# Patient Record
Sex: Male | Born: 1965
Health system: Southern US, Community
[De-identification: ages and names within clinical notes are randomized; demographics above are authoritative.]

## PROBLEM LIST (undated history)

## (undated) DIAGNOSIS — Z720 Tobacco use: Secondary | ICD-10-CM

## (undated) DIAGNOSIS — I739 Peripheral vascular disease, unspecified: Secondary | ICD-10-CM

## (undated) DIAGNOSIS — I10 Essential (primary) hypertension: Secondary | ICD-10-CM

## (undated) DIAGNOSIS — J449 Chronic obstructive pulmonary disease, unspecified: Secondary | ICD-10-CM

## (undated) DIAGNOSIS — I219 Acute myocardial infarction, unspecified: Secondary | ICD-10-CM

## (undated) HISTORY — DX: Tobacco use: Z72.0

## (undated) HISTORY — DX: Peripheral vascular disease, unspecified: I73.9

## (undated) HISTORY — DX: Chronic obstructive pulmonary disease, unspecified: J44.9

---

## 2015-08-04 DIAGNOSIS — I2510 Atherosclerotic heart disease of native coronary artery without angina pectoris: Secondary | ICD-10-CM

## 2015-08-04 HISTORY — PX: PERCUTANEOUS CORONARY STENT INTERVENTION (PCI-S): SHX6016

## 2015-08-04 HISTORY — DX: Atherosclerotic heart disease of native coronary artery without angina pectoris: I25.10

## 2015-12-11 ENCOUNTER — Encounter (HOSPITAL_COMMUNITY): Payer: Self-pay | Admitting: Emergency Medicine

## 2015-12-11 ENCOUNTER — Emergency Department (HOSPITAL_COMMUNITY)
Admission: EM | Admit: 2015-12-11 | Discharge: 2015-12-12 | Disposition: A | Discharge status: Home / Self Care | Payer: Self-pay | Attending: Emergency Medicine | Admitting: Emergency Medicine

## 2015-12-11 DIAGNOSIS — R000 Tachycardia, unspecified: Secondary | ICD-10-CM | POA: Insufficient documentation

## 2015-12-11 DIAGNOSIS — F1020 Alcohol dependence, uncomplicated: Secondary | ICD-10-CM | POA: Insufficient documentation

## 2015-12-11 DIAGNOSIS — I10 Essential (primary) hypertension: Secondary | ICD-10-CM | POA: Insufficient documentation

## 2015-12-11 DIAGNOSIS — F17200 Nicotine dependence, unspecified, uncomplicated: Secondary | ICD-10-CM | POA: Insufficient documentation

## 2015-12-11 DIAGNOSIS — R251 Tremor, unspecified: Secondary | ICD-10-CM | POA: Insufficient documentation

## 2015-12-11 HISTORY — DX: Essential (primary) hypertension: I10

## 2015-12-11 LAB — CBC
HCT: 45.6 % (ref 39.0–52.0)
Hemoglobin: 15.3 g/dL (ref 13.0–17.0)
MCH: 29.5 pg (ref 26.0–34.0)
MCHC: 33.6 g/dL (ref 30.0–36.0)
MCV: 87.9 fL (ref 78.0–100.0)
Platelets: 171 10*3/uL (ref 150–400)
RBC: 5.19 MIL/uL (ref 4.22–5.81)
RDW: 16 % — AB (ref 11.5–15.5)
WBC: 3.9 10*3/uL — ABNORMAL LOW (ref 4.0–10.5)

## 2015-12-11 LAB — COMPREHENSIVE METABOLIC PANEL
ALK PHOS: 79 U/L (ref 38–126)
ALT: 21 U/L (ref 17–63)
AST: 32 U/L (ref 15–41)
Albumin: 3.7 g/dL (ref 3.5–5.0)
Anion gap: 16 — ABNORMAL HIGH (ref 5–15)
BILIRUBIN TOTAL: 0.4 mg/dL (ref 0.3–1.2)
BUN: 13 mg/dL (ref 6–20)
CO2: 24 mmol/L (ref 22–32)
CREATININE: 1.17 mg/dL (ref 0.61–1.24)
Calcium: 8.6 mg/dL — ABNORMAL LOW (ref 8.9–10.3)
Chloride: 96 mmol/L — ABNORMAL LOW (ref 101–111)
GFR calc Af Amer: >60 mL/min (ref >60)
GLUCOSE: 134 mg/dL — AB (ref 65–99)
Potassium: 4.1 mmol/L (ref 3.5–5.1)
Sodium: 136 mmol/L (ref 135–145)
TOTAL PROTEIN: 7.6 g/dL (ref 6.5–8.1)

## 2015-12-11 LAB — ETHANOL: ALCOHOL ETHYL (B): 200 mg/dL — AB (ref <5)

## 2015-12-11 LAB — RAPID URINE DRUG SCREEN, HOSP PERFORMED
Amphetamines: NOT DETECTED
BARBITURATES: NOT DETECTED
Benzodiazepines: NOT DETECTED
COCAINE: NOT DETECTED
OPIATES: NOT DETECTED
Tetrahydrocannabinol: NOT DETECTED

## 2015-12-11 MED ORDER — SODIUM CHLORIDE 0.9 % IV BOLUS (SEPSIS): 1000.0000 mL | Freq: Once | INTRAVENOUS | Status: DC

## 2015-12-11 MED ORDER — THIAMINE HCL 100 MG/ML IJ SOLN
Freq: Once | INTRAVENOUS | Status: AC
  Administered 2015-12-12: 01:00:00 via INTRAVENOUS
  Filled 2015-12-11: qty 1000
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
## 2015-12-11 NOTE — ED Notes (Signed)
Pt. requesting detox for his alcoholism , last drink today , denies suicidal ideation / no hallucinations . Alert and oriented , respirations unlabored .

## 2015-12-12 MED ORDER — HYDROCHLOROTHIAZIDE 25 MG PO TABS: 25.0000 mg | ORAL_TABLET | Freq: Every day | ORAL | Status: DC

## 2015-12-12 MED ORDER — CHLORDIAZEPOXIDE HCL 25 MG PO CAPS: 25.0000 mg | ORAL_CAPSULE | Freq: Once | ORAL | Status: DC

## 2015-12-12 MED ORDER — CHLORDIAZEPOXIDE HCL 25 MG PO CAPS
50.0000 mg | ORAL_CAPSULE | Freq: Once | ORAL | Status: AC
  Administered 2015-12-12: 50 mg via ORAL
  Filled 2015-12-12: qty 2

## 2015-12-12 MED ORDER — CHLORDIAZEPOXIDE HCL 25 MG PO CAPS: ORAL_CAPSULE | ORAL | Status: DC

## 2015-12-12 NOTE — Discharge Instructions (Signed)
Alcohol Use Disorder °Alcohol use disorder is a mental disorder. It is not a one-time incident of heavy drinking. Alcohol use disorder is the excessive and uncontrollable use of alcohol over time that leads to problems with functioning in one or more areas of daily living. People with this disorder risk harming themselves and others when they drink to excess. Alcohol use disorder also can cause other mental disorders, such as mood and anxiety disorders, and serious physical problems. People with alcohol use disorder often misuse other drugs.  °Alcohol use disorder is common and widespread. Some people with this disorder drink alcohol to cope with or escape from negative life events. Others drink to relieve chronic pain or symptoms of mental illness. People with a family history of alcohol use disorder are at higher risk of losing control and using alcohol to excess.  °Drinking too much alcohol can cause injury, accidents, and health problems. One drink can be too much when you are: °· Working. °· Pregnant or breastfeeding. °· Taking medicines. Ask your doctor. °· Driving or planning to drive. °SYMPTOMS  °Signs and symptoms of alcohol use disorder may include the following:  °· Consumption of alcohol in larger amounts or over a longer period of time than intended. °· Multiple unsuccessful attempts to cut down or control alcohol use.   °· A great deal of time spent obtaining alcohol, using alcohol, or recovering from the effects of alcohol (hangover). °· A strong desire or urge to use alcohol (cravings).   °· Continued use of alcohol despite problems at work, school, or home because of alcohol use.   °· Continued use of alcohol despite problems in relationships because of alcohol use. °· Continued use of alcohol in situations when it is physically hazardous, such as driving a car. °· Continued use of alcohol despite awareness of a physical or psychological problem that is likely related to alcohol use. Physical  problems related to alcohol use can involve the brain, heart, liver, stomach, and intestines. Psychological problems related to alcohol use include intoxication, depression, anxiety, psychosis, delirium, and dementia.   °· The need for increased amounts of alcohol to achieve the same desired effect, or a decreased effect from the consumption of the same amount of alcohol (tolerance). °· Withdrawal symptoms upon reducing or stopping alcohol use, or alcohol use to reduce or avoid withdrawal symptoms. Withdrawal symptoms include: °¨ Racing heart. °¨ Hand tremor. °¨ Difficulty sleeping. °¨ Nausea. °¨ Vomiting. °¨ Hallucinations. °¨ Restlessness. °¨ Seizures. °DIAGNOSIS °Alcohol use disorder is diagnosed through an assessment by your health care provider. Your health care provider may start by asking three or four questions to screen for excessive or problematic alcohol use. To confirm a diagnosis of alcohol use disorder, at least two symptoms must be present within a 12-month period. The severity of alcohol use disorder depends on the number of symptoms: °· Mild--two or three. °· Moderate--four or five. °· Severe--six or more. °Your health care provider may perform a physical exam or use results from lab tests to see if you have physical problems resulting from alcohol use. Your health care provider may refer you to a mental health professional for evaluation. °TREATMENT  °Some people with alcohol use disorder are able to reduce their alcohol use to low-risk levels. Some people with alcohol use disorder need to quit drinking alcohol. When necessary, mental health professionals with specialized training in substance use treatment can help. Your health care provider can help you decide how severe your alcohol use disorder is and what type of treatment you need.   The following forms of treatment are available:  °· Detoxification. Detoxification involves the use of prescription medicines to prevent alcohol withdrawal  symptoms in the first week after quitting. This is important for people with a history of symptoms of withdrawal and for heavy drinkers who are likely to have withdrawal symptoms. Alcohol withdrawal can be dangerous and, in severe cases, cause death. Detoxification is usually provided in a hospital or in-patient substance use treatment facility. °· Counseling or talk therapy. Talk therapy is provided by substance use treatment counselors. It addresses the reasons people use alcohol and ways to keep them from drinking again. The goals of talk therapy are to help people with alcohol use disorder find healthy activities and ways to cope with life stress, to identify and avoid triggers for alcohol use, and to handle cravings, which can cause relapse. °· Medicines. Different medicines can help treat alcohol use disorder through the following actions: °¨ Decrease alcohol cravings. °¨ Decrease the positive reward response felt from alcohol use. °¨ Produce an uncomfortable physical reaction when alcohol is used (aversion therapy). °· Support groups. Support groups are run by people who have quit drinking. They provide emotional support, advice, and guidance. °These forms of treatment are often combined. Some people with alcohol use disorder benefit from intensive combination treatment provided by specialized substance use treatment centers. Both inpatient and outpatient treatment programs are available. °  °This information is not intended to replace advice given to you by your health care provider. Make sure you discuss any questions you have with your health care provider. °  °Document Released: 08/27/2004 Document Revised: 08/10/2014 Document Reviewed: 10/27/2012 °Elsevier Interactive Patient Education ©2016 [employer] °Substance Abuse Treatment Programs ° °Intensive Outpatient Programs °[facility]     °[address]      °[place], [place]                   °336-878-6098      ° °[facility] °[address] °[place], [place] °[phone] ° °[facility]     °(Inpatient and outpatient)     °[address]           °[phone]   ° °[facility] °336-288-1484 (Suboxone and Methadone) ° °[address]      °[place], [place] [address]      °336-882-2125      ° °[address] °[place], [place] °852-3033 ° °[facility] (Outpatient/Inpatient, [facility])    °(insurance only) [phone]      °       °[facility]Groups & Residential) °[place], [place] °336-389-1413 ° °   °[facility]     °[address]     °[place], [place]      °[phone]      ° °[facility] (for caregivers and family) °[address] °[place], [place] °336-299-4655 ° ° ° ° ° °Residential Treatment Programs °[facility]      °[address], , [place] [address]  °([phone]      ° °[facility] °[address] [place], [place] [address] °919-419-1059 ° °[facility]        °336-248-8914      ° °[facility] °1-800-659-3381 ° °[facility]             °[address]                                         °  [place], [place]                                                °877-615-2722 or 336-784-9470                              ° °[facility] [place] °[address] °[place], [address] °1.877.941.8954 ° °[facility]    °[address]      °[place], [place]     °[phone]      ° °[facility] °[address] °[place], [place] °336-285-9073 ° °[facility]   °[address]     °[place], [place] [address]     °336-899-1550      °Admissions: 8am-3pm M-F ° °[facility] °[address] °[place], [place] °336-227-7417 ° °[facility]: Residential Program (90 Days)   °[place], [place]      °336-725-8389 or 800-758-6077    ° °[facility] °[place], [place] °(Walk in Hours over the weekend or by  referral) ° °[facility] °[address], [place], [place] [address] °(336) [phone] ° °Crisis Mobile: [facility]:  1-877-626-1772 (for crisis response 24 hours a day) °[facility] Hotline:      1-800-256-2452 °Outpatient Psychiatry and Counseling ° °[facility]: Mobile Crisis Management 24 hours:  1-877-626-1772 ° °[facility] sliding scale fee and walk in schedule: M-F 8am-12pm/1pm-3pm °[address]  °[place], [place] [address] °336-387-6161 ° °[facility] °[address] °[place], [place] [address] °336-703-9650 ° °[facility] ([facility] known [facility])- new patient walk-in appointments available Monday - Friday 8am -3pm.          °[address] °[place], [place] [address] °336-676-6840 or crisis line- 336-676-6905 ° °[facility]/ Intensive Outpatient Therapy Program °[address] °[place], [place] [address] °336-832-9804 ° °[facility]                  °Crisis Services      °[phone]      °[address]     °[place], [place] [address]                ° °[facility]   °[facility] °[phone] °[address] °[place], [place] [address] ° ° °[facility]          °[address]  °[place], [place] [address]       °(336) [phone] ° °[facility] °[address] °[place], [place] [address] °336-292-1510 ° °[facility]    °[address]    °[place], [place] [address]     °336-632-3505      ° °Parish McKinney, MD     °[address]     °[place] [address]     °336-282-1251     °  °[facility] °[address] °[place] [address] ° °[facility]     °[address]     °[place],       °336-542-2076      ° °[facility] °Shamsher Ahluwalia, MD °[address] °  [place], [place]  [address] °336-420-9558 ° °[facility]     °[address]     °[place], [place] [address]     °336-274-1237      ° °[facility] °[address] °[place], [place] [address] °336-854-4450 °Resources for Temporary Residential Assistance/Crisis Centers ° °DAY CENTERS °[facility] °M-F 8am-3pm   °[address] [place], [place] [address]   336-332-0824 °Services include: laundry, barbering, support groups, case management, phone  & computer access, showers, AA/NA mtgs, mental health/substance abuse nurse, job skills class, disability information, VA assistance, spiritual classes, etc.  ° °HOMELESS SHELTERS ° °[facility]     °[facility]   °[address], [place]     °[phone]       °       °[facility] (women and children)       °[address] °[place], [place] [address] °336-275-0820 °[email or web address] for application and process °Application Required ° °[facility]   °[address]    °[place] [address]     °[phone]       °             °[facility] °[address] °[place], [place] [address] °336.273.5572 °336-235-0363(schedule application appt.) °Application Required ° °[facility] (women only)    °[address]     °[place], [place] [address]     °336-884-1039      °Intake starts 6pm daily °Need valid ID, SSC, & Police report °[facility] [place] °[address] °[place], [place] °336-881-5420 °Application Required ° °[facility] (men only)     °[address]      °[place], [place]     °[phone]      ° °[facility] °(Pregnant women only) °[address] °[place], [place] °336-275-0206 ° °[facility]      °[address]      °[place], [place] [address]     °[phone]      °       °[facility] °[address] °[place],  °336-723-1848 °90 day commitment/SA/Application process ° °[facility] only[facility]     °[address]     °[place],      °[phone]       °Check-in at 7pm     °       °[facility] °[address] °[place],  [address] °336-248-6684 °Men/Women/Women and Children must be there by 7 pm ° °[facility] °[place],  °[phone]                ° °

## 2015-12-12 NOTE — ED Notes (Signed)
Pt departed in NAD.  

## 2016-02-20 DIAGNOSIS — R001 Bradycardia, unspecified: Secondary | ICD-10-CM | POA: Insufficient documentation

## 2016-02-20 DIAGNOSIS — F1010 Alcohol abuse, uncomplicated: Secondary | ICD-10-CM | POA: Insufficient documentation

## 2016-05-14 DIAGNOSIS — I2510 Atherosclerotic heart disease of native coronary artery without angina pectoris: Secondary | ICD-10-CM | POA: Insufficient documentation

## 2016-05-15 DIAGNOSIS — G4733 Obstructive sleep apnea (adult) (pediatric): Secondary | ICD-10-CM | POA: Insufficient documentation

## 2019-07-10 ENCOUNTER — Encounter (HOSPITAL_COMMUNITY): Payer: Self-pay | Admitting: Internal Medicine

## 2019-07-10 ENCOUNTER — Inpatient Hospital Stay (HOSPITAL_COMMUNITY)
Admission: EM | Admit: 2019-07-10 | Discharge: 2019-07-12 | DRG: 282 | Disposition: A | Discharge status: Home / Self Care | Payer: Self-pay | Attending: Internal Medicine | Admitting: Internal Medicine

## 2019-07-10 ENCOUNTER — Ambulatory Visit (HOSPITAL_COMMUNITY): Admit: 2019-07-10 | Payer: Self-pay | Admitting: Internal Medicine

## 2019-07-10 ENCOUNTER — Encounter (HOSPITAL_COMMUNITY)
Admission: EM | Disposition: A | Discharge status: Home / Self Care | Payer: Self-pay | Source: Home / Self Care | Attending: Internal Medicine

## 2019-07-10 DIAGNOSIS — I2510 Atherosclerotic heart disease of native coronary artery without angina pectoris: Secondary | ICD-10-CM

## 2019-07-10 DIAGNOSIS — E782 Mixed hyperlipidemia: Secondary | ICD-10-CM | POA: Diagnosis present

## 2019-07-10 DIAGNOSIS — I509 Heart failure, unspecified: Secondary | ICD-10-CM

## 2019-07-10 DIAGNOSIS — I517 Cardiomegaly: Secondary | ICD-10-CM | POA: Diagnosis present

## 2019-07-10 DIAGNOSIS — Z955 Presence of coronary angioplasty implant and graft: Secondary | ICD-10-CM

## 2019-07-10 DIAGNOSIS — I2111 ST elevation (STEMI) myocardial infarction involving right coronary artery: Principal | ICD-10-CM

## 2019-07-10 DIAGNOSIS — Z20828 Contact with and (suspected) exposure to other viral communicable diseases: Secondary | ICD-10-CM | POA: Diagnosis present

## 2019-07-10 DIAGNOSIS — Z720 Tobacco use: Secondary | ICD-10-CM

## 2019-07-10 DIAGNOSIS — R7302 Impaired glucose tolerance (oral): Secondary | ICD-10-CM

## 2019-07-10 DIAGNOSIS — E785 Hyperlipidemia, unspecified: Secondary | ICD-10-CM

## 2019-07-10 DIAGNOSIS — I213 ST elevation (STEMI) myocardial infarction of unspecified site: Secondary | ICD-10-CM | POA: Diagnosis present

## 2019-07-10 DIAGNOSIS — I252 Old myocardial infarction: Secondary | ICD-10-CM | POA: Diagnosis present

## 2019-07-10 DIAGNOSIS — Z79899 Other long term (current) drug therapy: Secondary | ICD-10-CM

## 2019-07-10 HISTORY — PX: CORONARY/GRAFT ACUTE MI REVASCULARIZATION: CATH118305

## 2019-07-10 HISTORY — PX: LEFT HEART CATH AND CORONARY ANGIOGRAPHY: CATH118249

## 2019-07-10 LAB — CBC
HCT: 43.1 % (ref 39.0–52.0)
Hemoglobin: 13.9 g/dL (ref 13.0–17.0)
MCH: 27.4 pg (ref 26.0–34.0)
MCHC: 32.3 g/dL (ref 30.0–36.0)
MCV: 84.8 fL (ref 80.0–100.0)
Platelets: 206 10*3/uL (ref 150–400)
RBC: 5.08 MIL/uL (ref 4.22–5.81)
RDW: 13.6 % (ref 11.5–15.5)
WBC: 5.3 10*3/uL (ref 4.0–10.5)
nRBC: 0 % (ref 0.0–0.2)

## 2019-07-10 LAB — COMPREHENSIVE METABOLIC PANEL
ALT: 14 U/L (ref 0–44)
AST: 19 U/L (ref 15–41)
Albumin: 3 g/dL — ABNORMAL LOW (ref 3.5–5.0)
Alkaline Phosphatase: 108 U/L (ref 38–126)
Anion gap: 8 (ref 5–15)
BUN: 12 mg/dL (ref 6–20)
CO2: 26 mmol/L (ref 22–32)
Calcium: 8.5 mg/dL — ABNORMAL LOW (ref 8.9–10.3)
Chloride: 105 mmol/L (ref 98–111)
Creatinine, Ser: 1.07 mg/dL (ref 0.61–1.24)
GFR calc Af Amer: >60 mL/min (ref >60)
GFR calc non Af Amer: >60 mL/min (ref >60)
Glucose, Bld: 147 mg/dL — ABNORMAL HIGH (ref 70–99)
Potassium: 3.6 mmol/L (ref 3.5–5.1)
Sodium: 139 mmol/L (ref 135–145)
Total Bilirubin: 0.7 mg/dL (ref 0.3–1.2)
Total Protein: 6.5 g/dL (ref 6.5–8.1)

## 2019-07-10 LAB — POCT ACTIVATED CLOTTING TIME
Activated Clotting Time: 235 seconds
Activated Clotting Time: 274 seconds
Activated Clotting Time: 169 seconds
Activated Clotting Time: 208 seconds

## 2019-07-10 LAB — POCT I-STAT, CHEM 8
BUN: 12 mg/dL (ref 6–20)
Calcium, Ion: 1.19 mmol/L (ref 1.15–1.40)
Chloride: 102 mmol/L (ref 98–111)
Creatinine, Ser: 0.9 mg/dL (ref 0.61–1.24)
Glucose, Bld: 147 mg/dL — ABNORMAL HIGH (ref 70–99)
HCT: 43 % (ref 39.0–52.0)
Hemoglobin: 14.6 g/dL (ref 13.0–17.0)
Potassium: 3.6 mmol/L (ref 3.5–5.1)
Sodium: 140 mmol/L (ref 135–145)
TCO2: 27 mmol/L (ref 22–32)

## 2019-07-10 LAB — HEMOGLOBIN A1C
Hgb A1c MFr Bld: 6.3 % — ABNORMAL HIGH (ref 4.8–5.6)
Mean Plasma Glucose: 134.11 mg/dL

## 2019-07-10 LAB — RESPIRATORY PANEL BY RT PCR (FLU A&B, COVID)
Influenza A by PCR: NEGATIVE
Influenza B by PCR: NEGATIVE
SARS Coronavirus 2 by RT PCR: NEGATIVE

## 2019-07-10 LAB — LIPID PANEL
Cholesterol: 215 mg/dL — ABNORMAL HIGH (ref 0–200)
HDL: 34 mg/dL — ABNORMAL LOW (ref >40)
LDL Cholesterol: 142 mg/dL — ABNORMAL HIGH (ref 0–99)
Total CHOL/HDL Ratio: 6.3 RATIO
Triglycerides: 195 mg/dL — ABNORMAL HIGH (ref <150)
VLDL: 39 mg/dL (ref 0–40)

## 2019-07-10 LAB — TROPONIN I (HIGH SENSITIVITY)
Troponin I (High Sensitivity): 50 ng/L — ABNORMAL HIGH (ref <18)
Troponin I (High Sensitivity): >27000 ng/L (ref <18)

## 2019-07-10 LAB — PROTIME-INR
INR: 1 (ref 0.8–1.2)
Prothrombin Time: 13 seconds (ref 11.4–15.2)

## 2019-07-10 LAB — APTT: aPTT: 24 seconds (ref 24–36)

## 2019-07-10 SURGERY — CORONARY/GRAFT ACUTE MI REVASCULARIZATION
Anesthesia: LOCAL

## 2019-07-10 MED ORDER — HEPARIN (PORCINE) IN NACL 1000-0.9 UT/500ML-% IV SOLN
INTRAVENOUS | Status: AC
  Filled 2019-07-10: qty 1000

## 2019-07-10 MED ORDER — ACETAMINOPHEN 325 MG PO TABS: 650.0000 mg | ORAL_TABLET | ORAL | Status: DC | PRN

## 2019-07-10 MED ORDER — LABETALOL HCL 5 MG/ML IV SOLN: 10.0000 mg | INTRAVENOUS | Status: AC | PRN

## 2019-07-10 MED ORDER — PRASUGREL HCL 10 MG PO TABS
10.0000 mg | ORAL_TABLET | Freq: Every day | ORAL | Status: DC
  Administered 2019-07-11 – 2019-07-12 (×2): 10 mg via ORAL
  Filled 2019-07-10 (×2): qty 1

## 2019-07-10 MED ORDER — HEPARIN SODIUM (PORCINE) 1000 UNIT/ML IJ SOLN
INTRAMUSCULAR | Status: AC
  Filled 2019-07-10: qty 1

## 2019-07-10 MED ORDER — HYDRALAZINE HCL 20 MG/ML IJ SOLN: 10.0000 mg | INTRAMUSCULAR | Status: AC | PRN

## 2019-07-10 MED ORDER — NITROGLYCERIN 1 MG/10 ML FOR IR/CATH LAB
INTRA_ARTERIAL | Status: DC | PRN
  Administered 2019-07-10: 200 ug via INTRACORONARY

## 2019-07-10 MED ORDER — METOPROLOL TARTRATE 12.5 MG HALF TABLET
12.5000 mg | ORAL_TABLET | Freq: Two times a day (BID) | ORAL | Status: DC
  Administered 2019-07-10: 22:00:00 12.5 mg via ORAL
  Filled 2019-07-10 (×3): qty 1

## 2019-07-10 MED ORDER — PRASUGREL HCL 10 MG PO TABS
ORAL_TABLET | ORAL | Status: AC
  Filled 2019-07-10: qty 6

## 2019-07-10 MED ORDER — NITROGLYCERIN 1 MG/10 ML FOR IR/CATH LAB
INTRA_ARTERIAL | Status: AC
  Filled 2019-07-10: qty 10

## 2019-07-10 MED ORDER — SODIUM CHLORIDE 0.9% FLUSH
3.0000 mL | Freq: Two times a day (BID) | INTRAVENOUS | Status: DC
  Administered 2019-07-10 – 2019-07-12 (×4): 3 mL via INTRAVENOUS

## 2019-07-10 MED ORDER — PRASUGREL HCL 10 MG PO TABS
ORAL_TABLET | ORAL | Status: DC | PRN
  Administered 2019-07-10: 60 mg via ORAL

## 2019-07-10 MED ORDER — SODIUM CHLORIDE 0.9% FLUSH: 3.0000 mL | INTRAVENOUS | Status: DC | PRN

## 2019-07-10 MED ORDER — ASPIRIN 81 MG PO CHEW
81.0000 mg | CHEWABLE_TABLET | Freq: Every day | ORAL | Status: DC
  Administered 2019-07-11 – 2019-07-12 (×2): 81 mg via ORAL
  Filled 2019-07-10 (×2): qty 1

## 2019-07-10 MED ORDER — ATORVASTATIN CALCIUM 80 MG PO TABS
80.0000 mg | ORAL_TABLET | Freq: Every day | ORAL | Status: DC
  Administered 2019-07-11: 17:00:00 80 mg via ORAL
  Filled 2019-07-10: qty 1

## 2019-07-10 MED ORDER — ONDANSETRON HCL 4 MG/2ML IJ SOLN: 4.0000 mg | Freq: Four times a day (QID) | INTRAMUSCULAR | Status: DC | PRN

## 2019-07-10 MED ORDER — HEPARIN SODIUM (PORCINE) 1000 UNIT/ML IJ SOLN
INTRAMUSCULAR | Status: DC | PRN
  Administered 2019-07-10: 2000 [IU] via INTRAVENOUS
  Administered 2019-07-10: 4000 [IU] via INTRAVENOUS
  Administered 2019-07-10: 10000 [IU] via INTRAVENOUS

## 2019-07-10 MED ORDER — LIDOCAINE HCL (PF) 1 % IJ SOLN
INTRAMUSCULAR | Status: AC
  Filled 2019-07-10: qty 30

## 2019-07-10 MED ORDER — SODIUM CHLORIDE 0.9 % IV SOLN: 250.0000 mL | INTRAVENOUS | Status: DC | PRN

## 2019-07-10 MED ORDER — LIDOCAINE HCL (PF) 1 % IJ SOLN
INTRAMUSCULAR | Status: DC | PRN
  Administered 2019-07-10: 30 mL via INTRADERMAL

## 2019-07-10 MED ORDER — FUROSEMIDE 10 MG/ML IJ SOLN
20.0000 mg | Freq: Once | INTRAMUSCULAR | Status: AC
  Administered 2019-07-10: 20 mg via INTRAVENOUS

## 2019-07-10 MED ORDER — ENOXAPARIN SODIUM 40 MG/0.4ML ~~LOC~~ SOLN
40.0000 mg | SUBCUTANEOUS | Status: DC
  Administered 2019-07-11 – 2019-07-12 (×2): 40 mg via SUBCUTANEOUS
  Filled 2019-07-10 (×2): qty 0.4

## 2019-07-10 MED ORDER — VERAPAMIL HCL 2.5 MG/ML IV SOLN
INTRAVENOUS | Status: AC
  Filled 2019-07-10: qty 2

## 2019-07-10 MED ORDER — FUROSEMIDE 10 MG/ML IJ SOLN
INTRAMUSCULAR | Status: AC
  Filled 2019-07-10: qty 4

## 2019-07-10 MED ORDER — IOHEXOL 350 MG/ML SOLN
INTRAVENOUS | Status: DC | PRN
  Administered 2019-07-10: 105 mL via INTRA_ARTERIAL

## 2019-07-10 SURGICAL SUPPLY — 24 items
BALLN SAPPHIRE 2.5X12 (BALLOONS) ×2
BALLN SAPPHIRE ~~LOC~~ 3.5X12 (BALLOONS) ×2 IMPLANT
BALLN SAPPHIRE ~~LOC~~ 3.5X15 (BALLOONS) ×2 IMPLANT
BALLOON SAPPHIRE 2.5X12 (BALLOONS) ×1 IMPLANT
CATH INFINITI 5FR ANG PIGTAIL (CATHETERS) ×2 IMPLANT
CATH INFINITI 5FR JL4 (CATHETERS) ×2 IMPLANT
CATH LAUNCHER 6FR JR4 (CATHETERS) ×2 IMPLANT
ELECT DEFIB PAD ADLT CADENCE (PAD) ×2 IMPLANT
GLIDESHEATH SLEND A-KIT 6F 22G (SHEATH) ×2 IMPLANT
GUIDEWIRE INQWIRE 1.5J.035X260 (WIRE) ×1 IMPLANT
HOVERMATT SINGLE USE (MISCELLANEOUS) ×2 IMPLANT
INQWIRE 1.5J .035X260CM (WIRE) ×2
KIT ENCORE 26 ADVANTAGE (KITS) ×2 IMPLANT
KIT HEART LEFT (KITS) ×2 IMPLANT
KIT MICROPUNCTURE NIT STIFF (SHEATH) ×2 IMPLANT
PACK CARDIAC CATHETERIZATION (CUSTOM PROCEDURE TRAY) ×2 IMPLANT
PROTECTION STATION PRESSURIZED (MISCELLANEOUS) ×2
SHEATH PROBE COVER 6X72 (BAG) ×2 IMPLANT
STATION PROTECTION PRESSURIZED (MISCELLANEOUS) ×1 IMPLANT
STENT RESOLUTE ONYX3.0X38 (Permanent Stent) ×2 IMPLANT
TRANSDUCER W/STOPCOCK (MISCELLANEOUS) ×2 IMPLANT
TUBING CIL FLEX 10 FLL-RA (TUBING) ×2 IMPLANT
WIRE EMERALD 3MM-J .035X150CM (WIRE) ×2 IMPLANT
WIRE RUNTHROUGH .014X180CM (WIRE) ×2 IMPLANT

## 2019-07-10 NOTE — Progress Notes (Signed)
Site area: Right groin a 6 french arterial sheath was removed  Site Prior to Removal:  Level 0  Pressure Applied For 25 MINUTES    [place] Beginning at 1815p  Manual:   Yes.    Patient Status During Pull:  stable  Post Pull Groin Site:  Level 0  Post Pull Instructions Given:  Yes.    Post Pull Pulses Present:  Yes.    Dressing Applied:  Yes.    Comments:

## 2019-07-10 NOTE — Brief Op Note (Signed)
BRIEF CARDIAC CATHETERIZATION NOTE  07/10/2019  2:52 PM  PATIENT:  Jeffrey Winters  53 y.o. male  PRE-OPERATIVE DIAGNOSIS:  STEMI  POST-OPERATIVE DIAGNOSIS:  STEMI  PROCEDURE:  Procedure(s): CORONARY/GRAFT ACUTE MI REVASCULARIZATION (N/A)  SURGEON:  Surgeon(s) and Role:    * , , MD - Primary  FINDINGS: 1. 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 2. Patent stent in OM2. 3. Moderately to severely elevated LVEDP (~35 mmHg). 4. Successful primary PCI to distal RCA using resolute Onyx 3.0 x38 mm DES with 0% residual stenosis and TIMI-3 flow.  RECOMMENDATIONS: 1. Admit to 2H-ICU for post-STEMI care. 2. Obtain TTE; ventriculogram not performed due to significantly elevated LVEDP. 3. DAPT with ASA and prasugrel for at least 12 months. 4. Aggressive secondary prevention.  Nelva Bush, MD [facility]

## 2019-07-10 NOTE — H&P (Signed)
Cardiology Admission History and Physical:   Patient ID: Jeffrey Winters MRN: [id number]; DOB: [birth year]   Admission date: 07/10/2019  Primary Care Provider: Patient, No Pcp Per Primary Cardiologist: [facility] Primary Electrophysiologist:  None   Chief Complaint: Chest pain  Patient Profile:   Jeffrey Winters is a 53 y.o. male with coronary artery disease status post NSTEMI's in July and October of 2017 status post PCI to OM2 in 02/2016, hypertension, and tobacco use, presenting with cute onset of chest pain and inferior ST segment elevation on EKG concerning for inferior STEMI.  History of Present Illness:   Jeffrey Winters reports that he was in his usual state of health until approximately 1130 this morning when he developed acute onset of left-sided chest pain at rest.  He describes it as a cramping feeling radiating to the left shoulder and upper back accompanied by diaphoresis.  He initially tried to wait the pain out, though his mom subsequently called 911.  When EMS arrived, patient was noted to have inferior ST segment elevation.  He received 2 sublingual nitroglycerin tabs, which helped improve his chest pain from 8/10 to 6/10.  Due to continued chest pain and inferior ST segment elevation on EKG, [place] was taken for emergent cardiac catheterization.  This revealed an occluded distal RCA as well as noncritical lesions involving the mid LAD and rPL2 .  Jeffrey Winters underwent primary PCI to the distal RCA with resolution of chest pain.  Heart Pathway Score:     Past Medical History:  Diagnosis Date   Coronary artery disease 2017   MI s/p PCI to OM2   Hypertension     Past Surgical History:  Procedure Laterality Date   PERCUTANEOUS CORONARY STENT INTERVENTION (PCI-S)  2017   DES to OM2 at [facility]     Medications Prior to Admission: Prior to Admission medications   Medication Sig Start Date  Date Taking? Authorizing Provider  chlordiazePOXIDE (LIBRIUM) 25 MG capsule 50mg  PO TID x 1D,  then 25-50mg  PO BID X 1D, then 25-50mg  PO QD X 1D 12/12/15   Upstill, Shari, PA-C  hydrochlorothiazide (HYDRODIURIL) 25 MG tablet Take 1 tablet (25 mg total) by mouth daily. 12/12/15   Charlann Lange, PA-C     Allergies:   No Known Allergies  Social History:   Social History   Tobacco Use   Smoking status: Current Every Day Smoker    Packs/day: 1.00   Smokeless tobacco: Never Used  Substance Use Topics   Alcohol use: Not Currently   Drug use: No     Family History:   The patient's family history includes Heart disease in his father and mother.    ROS:  Please see the history of present illness. All other ROS reviewed and negative.     Physical Exam/Data:   Vitals:   07/10/19 1435 07/10/19 1440 07/10/19 1530 07/10/19 1535  BP: 125/83 124/76 138/75 (!) 143/64  Pulse: 82 93 71 (!) 52  Resp: 15 16 (!) 21 (!) 24  SpO2: 100% 99% 99% 100%  Weight:        Intake/Output Summary (Last 24 hours) at 07/10/2019 1626 Last data filed at 07/10/2019 1600 Gross per 24 hour  Intake --  Output 900 ml  Net -900 ml   Last 3 Weights 07/10/2019 12/11/2015  Weight (lbs) 250 lb 206 lb 7 oz  Weight (kg) 113.399 kg 93.639 kg     There is no height or weight on file to calculate BMI.  General:  Well nourished,  well developed, in no acute distress HEENT: normal Lymph: no adenopathy Neck: no JVD Endocrine:  No thryomegaly Vascular: No carotid bruits; FA pulses 2+ bilaterally without bruits  Cardiac:  normal S1, S2; RRR; no murmurs, rubs, or gallops Lungs:  clear to auscultation bilaterally, no wheezing, rhonchi or rales  Abd: soft, nonter, no hepatomegaly  Ext: no lower extremity edema Musculoskeletal:  No deformities, BUE and BLE strength normal and equal Skin: warm and dry  Neuro:  CNs 2-12 intact, no focal abnormalities noted Psych:  Normal affect    EKG:  The ECG that was done by EMS showed sinus rhythm with inferior ST segment elevation and reciprocal depressions in V1 and V2  (personally reviewed).  Relevant CV Studies: LHC/PCI (07/10/2019): 1. 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 2. Patent stent in OM2. 3. Moderately to severely elevated LVEDP (~35 mmHg). 4. Successful primary PCI to distal RCA using resolute Onyx 3.0 x38 mm DES with 0% residual stenosis and TIMI-3 flow.  Laboratory Data:  High Sensitivity Troponin:   Recent Labs  Lab 07/10/19 1400  TROPONINIHS 50*      Chemistry Recent Labs  Lab 07/10/19 1400  NA 139  K 3.6  CL 105  CO2 26  GLUCOSE 147*  BUN 12  CREATININE 1.07  CALCIUM 8.5*  GFRNONAA >60  GFRAA >60  ANIONGAP 8    Recent Labs  Lab 07/10/19 1400  PROT 6.5  ALBUMIN 3.0*  AST 19  ALT 14  ALKPHOS 108  BILITOT 0.7   Hematology Recent Labs  Lab 07/10/19 1400  WBC 5.3  RBC 5.08  HGB 13.9  HCT 43.1  MCV 84.8  MCH 27.4  MCHC 32.3  RDW 13.6  PLT 206   BNPNo results for input(s): BNP, PROBNP in the last 168 hours.  DDimer No results for input(s): DDIMER in the last 168 hours.   Radiology/Studies:  No results found.  Assessment and Plan:   Inferior STEMI: Patient with acute onset of chest pain inferior ST segment elevation by EKG.  Cath confirmed occlusion of the distal RCA, most likely due to acute plaque rupture and thrombotic occlusion (type I MI).  Patient is status post primary PCI with resolution of chest pain.  He has noncritical disease involving the mid LAD (50-60%) and rPL2 (80%), which were previously noted on catheterizations at [facility] in 2017.  Dual antiplatelet therapy with aspirin and prasugrel for at least 12 months.  Atorvastatin 80 mg daily; goal LDL less than 70.  Initiate metoprolol tartrate 12.5 mg daily, to be uptitrated as tolerated.  Tobacco cessation.  Acute heart failure: Patient noted to have moderately to severely elevated LVEDP at the  of catheterization.  Left ventriculogram was not performed on  account of this.  Obtain transthoracic echocardiogram.  Furosemide 20 mg IV x1; this may need to be redosed based on urine output and symptoms.  Initiate metoprolol tartrate 12.5 mg daily.  Hyperlipidemia: LDL noted to be moderately elevated today.  Atorvastatin 80 mg daily.  Impaired glucose tolerance: Hemoglobin A1c elevated at 6.3.  Encourage lifestyle modifications including diet and exercise.  Tobacco abuse: Patient reports smoking at least 1 pack/day.  Smoking cessation encouraged.  Severity of Illness: The appropriate patient status for this patient is INPATIENT. Inpatient status is judged to be reasonable and necessary in order to provide the required intensity of service to ensure the patient's safety. The patient's presenting symptoms, physical  exam findings, and initial radiographic and laboratory data in the context of their chronic comorbidities is felt to place them at high risk for further clinical deterioration. Furthermore, it is not anticipated that the patient will be medically stable for discharge from the hospital within 2 midnights of admission. The following factors support the patient status of inpatient.   " The patient's presenting symptoms include acute chest pain. " The initial radiographic and laboratory data are worrisome because of EKG demonstrating inferior ST segment elevation. " The chronic co-morbidities include tobacco abuse and coronary artery disease.  * I certify that at the point of admission it is my clinical judgment that the patient will require inpatient hospital care spanning beyond 2 midnights from the point of admission due to high intensity of service, high risk for further deterioration and high frequency of surveillance required.*   For questions or updates, please contact [place] Please consult [email or web address] for contact info under   Signed, Nelva Bush, MD  07/10/2019 4:26 PM

## 2019-07-11 ENCOUNTER — Encounter (HOSPITAL_COMMUNITY): Payer: Self-pay | Admitting: Internal Medicine

## 2019-07-11 ENCOUNTER — Inpatient Hospital Stay (HOSPITAL_COMMUNITY): Payer: Self-pay

## 2019-07-11 ENCOUNTER — Other Ambulatory Visit: Payer: Self-pay

## 2019-07-11 DIAGNOSIS — I2111 ST elevation (STEMI) myocardial infarction involving right coronary artery: Secondary | ICD-10-CM

## 2019-07-11 LAB — BASIC METABOLIC PANEL
Anion gap: 7 (ref 5–15)
BUN: 14 mg/dL (ref 6–20)
CO2: 27 mmol/L (ref 22–32)
Calcium: 8.9 mg/dL (ref 8.9–10.3)
Chloride: 103 mmol/L (ref 98–111)
Creatinine, Ser: 1.06 mg/dL (ref 0.61–1.24)
GFR calc Af Amer: >60 mL/min (ref >60)
GFR calc non Af Amer: >60 mL/min (ref >60)
Glucose, Bld: 125 mg/dL — ABNORMAL HIGH (ref 70–99)
Potassium: 4 mmol/L (ref 3.5–5.1)
Sodium: 137 mmol/L (ref 135–145)

## 2019-07-11 LAB — CBC
HCT: 41.9 % (ref 39.0–52.0)
Hemoglobin: 13.7 g/dL (ref 13.0–17.0)
MCH: 27.2 pg (ref 26.0–34.0)
MCHC: 32.7 g/dL (ref 30.0–36.0)
MCV: 83.3 fL (ref 80.0–100.0)
Platelets: 200 10*3/uL (ref 150–400)
RBC: 5.03 MIL/uL (ref 4.22–5.81)
RDW: 13.8 % (ref 11.5–15.5)
WBC: 6.4 10*3/uL (ref 4.0–10.5)
nRBC: 0 % (ref 0.0–0.2)

## 2019-07-11 LAB — TROPONIN I (HIGH SENSITIVITY)
Troponin I (High Sensitivity): >27000 ng/L (ref <18)
Troponin I (High Sensitivity): >27000 ng/L (ref <18)

## 2019-07-11 LAB — MRSA PCR SCREENING: MRSA by PCR: NEGATIVE

## 2019-07-11 LAB — ECHOCARDIOGRAM COMPLETE: Weight: 3470.92 oz

## 2019-07-11 MED ORDER — CHLORHEXIDINE GLUCONATE CLOTH 2 % EX PADS
6.0000 | MEDICATED_PAD | Freq: Every day | CUTANEOUS | Status: DC
  Administered 2019-07-11: 6 via TOPICAL

## 2019-07-11 MED FILL — Verapamil HCl IV Soln 2.5 MG/ML: INTRAVENOUS | Qty: 2 | Status: AC

## 2019-07-11 MED FILL — Heparin Sod (Porcine)-NaCl IV Soln 1000 Unit/500ML-0.9%: INTRAVENOUS | Qty: 1000 | Status: AC

## 2019-07-11 MED FILL — Nitroglycerin IV Soln 100 MCG/ML in D5W: INTRA_ARTERIAL | Qty: 10 | Status: CN

## 2019-07-11 NOTE — Progress Notes (Signed)
CARDIAC REHAB PHASE I   PRE:  Rate/Rhythm: 56 SB    BP: sitting 104/65    SaO2:   MODE:  Ambulation: 400 ft   POST:  Rate/Rhythm: 84 SR    BP: sitting 129/73     SaO2:   Tolerated well, no c/o. Began discussing MI, stent, Effient, restrictions, diet, and smoking cessation. Pt voices understanding. He is thinking about quitting smoking, making a plan. Gave diet info, including DM. Encouraged to read info and we will discuss in am. Encouraged more walking. [place], ACSM 07/11/2019 3:26 PM

## 2019-07-11 NOTE — Plan of Care (Signed)
Stable overnight. S/p cardiac cath, DES, no complications with R groin access site. OOB to recliner, denies CP or SOB with exertion. BP soft while sleeping but MAP stable. SB with one brief episode of HR 39 while asleep.   Continuing teaching on Heart Healthy lifestyle changes.    Problem: Education: Goal: Understanding of CV disease, CV risk reduction, and recovery process will improve Outcome: Progressing Goal: Individualized Educational Video(s) Outcome: Progressing   Problem: Activity: Goal: Ability to return to baseline activity level will improve Outcome: Progressing   Problem: Cardiovascular: Goal: Ability to achieve and maintain adequate cardiovascular perfusion will improve Outcome: Progressing Goal: Vascular access site(s) Level 0-1 will be maintained Outcome: Progressing   Problem: Health Behavior/Discharge Planning: Goal: Ability to safely manage health-related needs after discharge will improve Outcome: Progressing

## 2019-07-11 NOTE — Progress Notes (Signed)
Chaplain responded to consult for Advanced Directive.  Chaplain explained what a HPOA is and explained filling out paperwork to Jeffrey Winters.  Chaplain told Jeffrey Winters to have a chaplain paged if he is interested in completing it.  Chaplain will follow-up as needed.

## 2019-07-11 NOTE — Progress Notes (Addendum)
Progress Note  Patient Name: Mosi Hannold Date of Encounter: 07/11/2019  Primary Cardiologist: No primary care provider on file.   Subjective   Feels well this morning. No chest pain or shortness of breath.  Inpatient Medications    Scheduled Meds: . aspirin  81 mg Oral Daily  . atorvastatin  80 mg Oral q1800  . Chlorhexidine Gluconate Cloth  6 each Topical Daily  . enoxaparin (LOVENOX) injection  40 mg Subcutaneous Q24H  . metoprolol tartrate  12.5 mg Oral BID  . prasugrel  10 mg Oral Daily  . sodium chloride flush  3 mL Intravenous Q12H   Continuous Infusions: . sodium chloride     PRN Meds: sodium chloride, acetaminophen, ondansetron (ZOFRAN) IV, sodium chloride flush   Vital Signs    Vitals:   07/11/19 0630 07/11/19 0700 07/11/19 0800 07/11/19 0900  BP: 97/63 93/63 (!) 99/55 (!) 87/65  Pulse: 67 63 (!) 56 (!) 53  Resp: _0 Temp:  98.4 F (36.9 C)    TempSrc:  Oral    SpO2: 97% 97% 96% 98%  Weight:        Intake/Output Summary (Last 24 hours) at 07/11/2019 0937 Last data filed at 07/11/2019 0800 Gross per 24 hour  Intake 480 ml  Output 1975 ml  Net -1495 ml   Last 3 Weights 07/11/2019 07/10/2019 12/11/2015  Weight (lbs) 216 lb 14.9 oz 250 lb 206 lb 7 oz  Weight (kg) 98.4 kg 113.399 kg 93.639 kg      Telemetry    Sinus rhythm, sinus bradycardia without arrhythmia - Personally Reviewed  ECG    NSR 58 bpm, age indeterminate inferior infarct - Personally Reviewed  Physical Exam  Alert, oriented, in NAD GEN: No acute distress.   Neck: No JVD Cardiac: RRR, no murmurs, rubs, or gallops.  Respiratory: Clear to auscultation bilaterally. GI: Soft, nontender, non-distended  MS: No edema; No deformity. Right groin clear. Neuro:  Nonfocal  Psych: Normal affect   Labs    High Sensitivity Troponin:   Recent Labs  Lab 07/10/19 1400 07/10/19 2114 07/11/19 0247 07/11/19 0749  TROPONINIHS 50* >27,000* >27,000* >27,000*      Chemistry Recent  Labs  Lab 07/10/19 1358 07/10/19 1400 07/11/19 0247  NA 140 139 137  K 3.6 3.6 4.0  CL 102 105 103  CO2  --  26 27  GLUCOSE 147* 147* 125*  BUN _1 CREATININE 0.90 1.07 1.06  CALCIUM  --  8.5* 8.9  PROT  --  6.5  --   ALBUMIN  --  3.0*  --   AST  --  19  --   ALT  --  14  --   ALKPHOS  --  108  --   BILITOT  --  0.7  --   GFRNONAA  --  >60 >60  GFRAA  --  >60 >60  ANIONGAP  --  8 7     Hematology Recent Labs  Lab 07/10/19 1358 07/10/19 1400 07/11/19 0247  WBC  --  5.3 6.4  RBC  --  5.08 5.03  HGB 14.6 13.9 13.7  HCT 43.0 43.1 41.9  MCV  --  84.8 83.3  MCH  --  27.4 27.2  MCHC  --  32.3 32.7  RDW  --  13.6 13.8  PLT  --  206 200    BNPNo results for input(s): BNP, PROBNP in the last 168 hours.   DDimer No results  for input(s): DDIMER in the last 168 hours.   Radiology    No results found.  Cardiac Studies   Echo pending today.  Cardiac Cath: Conclusion  Conclusions: 1. Multivessel coronary artery disease with acute occlusion of distal RCA (culprit lesion) as well as 50-60% mid LAD and 80% rPL2 stenoses. 2. Patent stent in OM2. 3. Moderately to severely elevated LVEDP (30-35 mmHg). 4. Successful primary PCI to the distal RCA using a Resolute Onyx 3.0 x38 mm DES with 0% residual stenosis and TIMI-3 flow.  Recommendations: 1. Admit to 2H-ICU for post-STEMI care. 2. Obtain transthoracic echocardiogram; ventriculogram was not performed due to significantly elevated LVEDP. 3. Dual antiplatelet therapy with aspirin and prasugrel for at least 12 months. 4. Aggressive secondary prevention.  Nelva Bush, MD [facility]   Recommendations  Antiplatelet/Anticoag Recommend uninterrupted dual antiplatelet therapy with Aspirin 23m daily and Prasugrel 161mdaily for a minimum of 12 months (ACS-Class I recommendation).  Discharge Date In the absence of any other complications or medical issues, we expect the patient to be ready for discharge on  07/12/2019.  Indications  ST elevation myocardial infarction involving right coronary artery (HCC) [I21.11 (ICD-10-CM)]  Procedural Details  Technical Details Indication: [age].o. year-old man with history of coronary artery disease status post NSTEMI's in July and October of 2017 status post PCI to OM2 in 02/2016, hypertension, and tobacco use, presenting via EMS with chest pain and inferior ST segment elevation consistent with STEMI.  GFR: >60 ml/min  Procedure: The risks, benefits, complications, treatment options, and expected outcomes were discussed with the patient. The patient provided emergent verbal consent. The right groin was prepped and draped in the usual manner, as neither radial pulse was easily palpable. Ultrasound was used to evaluate the right common femoral artery. It was patent.  An ultrasound image was saved in the permanent record. A micropuncture needle was used to access the right common femoral artery under ultrasound guidance. Using the modified Seldinger access technique, a 48F sheath was placed in the right femoral artery.  Selective coronary angiography was performed using 411F JL4 diagnostic and 48F JR4 guide catheters to engage the left and right coronary arteries, respectively.  Left heart catheterization was performed using a 411F pigtail catheter following PCI to the RCA. Left ventriculogram was not performed due to elevated LVEDP.  PCI to distal RCA: Heparin was used for anticoagulation.  The patient was loaded with prasugrel.  Via the 48F JR4 guide catheter, a Runthrough wire was advanced into the RCA continuation.  The distal RCA occlusion was dilated using a [place]app[place] 2.5 x 12 mm balloon at 8 atm.  It became apparent that there was diffuse disease distal to the culprit lesion of up to 50%.  The decision was made to cover the entire diseased segment with a Resolute Onyx 3.0 x 38 mm drug-eluting stent, which was deployed at 12 atm and postdilated with the same balloon at 16  atm.  Further postdilation was performed using [place] Sapphire 3.5 x 15 mm and 3.5 x 12 mm balloons at up to 18 atm.  Final angiogram demonstrates 0% residual stenosis with TIMI-3 flow.  There were no immediate complications. The patient was taken to the recovery area in stable condition.   Estimated blood loss <50 mL.   During this procedure no sedation was administered.   Coronary Findings  Diagnostic Dominance: Right Left Main  Vessel is large. Vessel is angiographically normal.  Left Anterior Descending  Vessel is large.  Mid LAD  lesion 55% stenosed  Mid LAD lesion is 55% stenosed.  Dist LAD lesion 40% stenosed  Dist LAD lesion is 40% stenosed.  First Diagonal Branch  Vessel is small in size.  Second Diagonal Branch  Vessel is small in size.  Third Diagonal Branch  Vessel is small in size.  Left Circumflex  Vessel is large. The vessel exhibits minimal luminal irregularities.  First Obtuse Marginal Branch  Vessel is small in size.  Second Obtuse Marginal Branch  Vessel is large in size.  2nd Mrg lesion 0% stenosed  Previously placed 2nd Mrg stent (unknown type) is widely patent. Previously placed stent displays no restenosis.  Third Obtuse Marginal Branch  Vessel is moderate in size.  Right Coronary Artery  Vessel is large.  Mid RCA to Dist RCA lesion 100% stenosed  Mid RCA to Dist RCA lesion is 100% stenosed. Vessel is the culprit lesion. The lesion is thrombotic.  Dist RCA lesion 50% stenosed  Dist RCA lesion is 50% stenosed.  Right Posterior Descending Artery  Vessel is moderate in size.  First Right Posterolateral Branch  Vessel is small in size.  Second Right Posterolateral Branch  Vessel is moderate in size.  2nd RPL lesion 80% stenosed  2nd RPL lesion is 80% stenosed.  Intervention  Mid RCA to Dist RCA lesion  Stent (Also treats lesions: Dist RCA)  CATHETER LAUNCHER 6FR JR4 guide catheter was inserted. Pre-stent angioplasty was performed using a BALLOON  SAPPHIRE 2.5X12. A drug-eluting stent was successfully placed using a STENT RESOLUTE KPTW6.5K81. Maximum pressure: 16 atm. Stent strut is well apposed. Post-stent angioplasty was performed using a BALLOON SAPPHIRE [place] 3.5X12. Maximum pressure: 18 atm.  Post-Intervention Lesion Assessment  The intervention was successful. Pre-interventional TIMI flow is 0. Post-intervention TIMI flow is 3. No complications occurred at this lesion.  There is a 0% residual stenosis post intervention.  Dist RCA lesion  Stent (Also treats lesions: Mid RCA to Dist RCA)  CATHETER LAUNCHER 6FR JR4 guide catheter was inserted. Pre-stent angioplasty was performed using a BALLOON SAPPHIRE 2.5X12. A drug-eluting stent was successfully placed using a STENT RESOLUTE EXNT7.0Y17. Maximum pressure: 16 atm. Stent strut is well apposed. Post-stent angioplasty was performed using a BALLOON SAPPHIRE [place] 3.5X12. Maximum pressure: 18 atm.  Post-Intervention Lesion Assessment  The intervention was successful. Pre-interventional TIMI flow is 0. Post-intervention TIMI flow is 3. No complications occurred at this lesion.  There is a 0% residual stenosis post intervention.  Left Heart  Left Ventricle Moderately to severely elevated left ventricular filling pressure (LVEDP 30-35 mmHg).  Aortic Valve There is no aortic valve stenosis.  Coronary Diagrams  Diagnostic Dominance: Right  Intervention    Patient Profile     53 y.o. male presenting with inferior STEMI 07-10-2019, treated with PCI of the distal RCA  Assessment & Plan    1. Inferior STEMI: clinically stable, trop > 27,000. On ASA, prasugrel, atorvastatin, low-dose metoprolol with hold parameters for bradycardia.  2. Acute heart failure: 2 D echo pending. LVEDP markedly elevated at cath. Does not appear congested. Check CXR.  3. Mixed hyperlipidemia: now on atorvastatin 80 mg  4. Tobacco abuse: cessation counseling done  Plan: review echo when completed. tx tele. Likely  home tomorrow.  For questions or updates, please contact [place] Please consult [email or web address] for contact info under        Signed, Sherren Mocha, MD  07/11/2019, 9:37 AM

## 2019-07-11 NOTE — Progress Notes (Signed)
  Echocardiogram 2D Echocardiogram has been performed.  Jeffrey Winters 07/11/2019, 11:42 AM

## 2019-07-11 NOTE — Progress Notes (Signed)
Patient is in unit. Pt is alert and oriented to self, place, time and situation.  Call bell within reach, and educated to call nurse when needs assistance.

## 2019-07-12 ENCOUNTER — Telehealth: Payer: Self-pay | Admitting: Cardiovascular Disease

## 2019-07-12 DIAGNOSIS — E785 Hyperlipidemia, unspecified: Secondary | ICD-10-CM

## 2019-07-12 HISTORY — DX: Hyperlipidemia, unspecified: E78.5

## 2019-07-12 MED ORDER — ASPIRIN 81 MG PO CHEW: 81.0000 mg | CHEWABLE_TABLET | Freq: Every day | ORAL | 1 refills | Status: DC

## 2019-07-12 MED ORDER — PRASUGREL HCL 10 MG PO TABS: 10.0000 mg | ORAL_TABLET | Freq: Every day | ORAL | 2 refills | Status: DC

## 2019-07-12 MED ORDER — METOPROLOL TARTRATE 25 MG PO TABS: 12.5000 mg | ORAL_TABLET | Freq: Two times a day (BID) | ORAL | 1 refills | Status: DC

## 2019-07-12 MED ORDER — ATORVASTATIN CALCIUM 80 MG PO TABS: 80.0000 mg | ORAL_TABLET | Freq: Every day | ORAL | 1 refills | Status: DC

## 2019-07-12 MED ORDER — NITROGLYCERIN 0.4 MG SL SUBL: 0.4000 mg | SUBLINGUAL_TABLET | SUBLINGUAL | 2 refills | Status: DC | PRN

## 2019-07-12 MED FILL — ATORVASTATIN CALCIUM 80 MG: 80 | 30 days supply | Qty: 30 | Fill #0

## 2019-07-12 MED FILL — ASPIRIN LOW DOSE 81 MG CHEW: 81 | 90 days supply | Qty: 90 | Fill #0

## 2019-07-12 MED FILL — PRASUGREL HCL 10 MG TABS: 10 | 30 days supply | Qty: 30 | Fill #0

## 2019-07-12 MED FILL — NITROGLYCERIN 0.4 MG TAB SL: 0.4 | 8 days supply | Qty: 25 | Fill #0

## 2019-07-12 NOTE — Progress Notes (Signed)
CARDIAC REHAB PHASE I   PRE:  Rate/Rhythm: 75 SR    BP: sitting 127/62    SaO2:   MODE:  Ambulation: 600 ft   POST:  Rate/Rhythm: 84 SR    BP: sitting 116/64     SaO2:   Tolerated well although legs tired toward end. Ed completed with good reception. He needs considerable change. Will refer to [place], ACSM 07/12/2019 10:39 AM

## 2019-07-12 NOTE — Telephone Encounter (Signed)
° ° °  TOC with Kathlen Mody 12/18

## 2019-07-12 NOTE — Plan of Care (Signed)

## 2019-07-12 NOTE — Discharge Summary (Signed)
Discharge Summary    Patient ID: Jeffrey Winters,  MRN: [id number], DOB/AGE: 1965/09/26 53 y.o.  Admit date: 07/10/2019 Discharge date: 07/12/2019  Primary Care Provider: Patient, No Pcp Per Primary Cardiologist: Sherren Mocha, MD  Discharge Diagnoses    Principal Problem:   STEMI (ST elevation myocardial infarction) Jeffrey Winters) Active Problems:   STEMI involving right coronary artery (Jeffrey Winters)   Hyperlipidemia   Allergies No Known Allergies  Diagnostic Studies/Procedures    Cath: 07/10/19  Conclusions: 1. Multivessel coronary artery disease with acute occlusion of distal RCA (culprit lesion) as well as 50-60% mid LAD and 80% rPL2 stenoses. 2. Patent stent in OM2. 3. Moderately to severely elevated LVEDP (30-35 mmHg). 4. Successful primary PCI to the distal RCA using a Resolute Onyx 3.0 x38 mm DES with 0% residual stenosis and TIMI-3 flow.  Recommendations: 1. Admit to 2H-ICU for post-STEMI care. 2. Obtain transthoracic echocardiogram; ventriculogram was not performed due to significantly elevated LVEDP. 3. Dual antiplatelet therapy with aspirin and prasugrel for at least 12 months. 4. Aggressive secondary prevention.  Jeffrey Bush, MD [facility]  Diagnostic Dominance: Right  Intervention     TTE: 07/11/19  IMPRESSIONS    1. Left ventricular ejection fraction, by visual estimation, is 55%. The left ventricle has normal function. There is mildly increased left ventricular hypertrophy. Basal inferior severe hypokinesis.  2. Left ventricular diastolic parameters are consistent with Grade II diastolic dysfunction (pseudonormalization).  3. Global right ventricle has normal systolic function.The right ventricular size is normal. No increase in right ventricular wall thickness.  4. Left atrial size was normal.  5. Right atrial size was normal.  6. The mitral valve is normal in structure. Trace mitral valve regurgitation. No evidence of mitral stenosis.  7. The  tricuspid valve is normal in structure. Tricuspid valve regurgitation is trivial.  8. The aortic valve is tricuspid. Aortic valve regurgitation is not visualized. No evidence of aortic valve sclerosis or stenosis.  9. The inferior vena cava is normal in size with <50% respiratory variability, suggesting right atrial pressure of 8 mmHg. 10. The tricuspid regurgitant velocity is 2.66 m/s, and with an assumed right atrial pressure of 8 mmHg, the estimated right ventricular systolic pressure is mildly elevated at 36.3 mmHg. _____________   History of Present Illness     53 y.o. male with coronary artery disease status post NSTEMI's in July and October of 2017 status post PCI to OM2 in 02/2016, hypertension, and tobacco use, who presented with acute onset of chest pain and inferior ST segment elevation on EKG concerning for inferior STEMI.  Jeffrey Winters reported that he was in his usual state of health until approximately 1130 that morning when he developed acute onset of left-sided chest pain at rest.  He described it as a cramping feeling radiating to the left shoulder and upper back accompanied by diaphoresis.  He initially tried to wait the pain out, though his mom subsequently called 911.  When EMS arrived, patient was noted to have inferior ST segment elevation.  He received 2 sublingual nitroglycerin tabs, which helped improve his chest pain from 8/10 to 6/10.  Due to continued chest pain and inferior ST segment elevation on EKG, he was taken for emergent cardiac catheterization.    Hospital Course     Cardiac cath revealed an occluded distal RCA as well as noncritical lesions involving the mid LAD and rPL2 .  Jeffrey Winters underwent primary PCI to the distal RCA with resolution of chest pain. HsT  peaked at > 27,000x3. Placed on DAPT with ASA/Effient for at least one year. Attempted to start on low dose metoprolol but HRs remained to low, therefore was stopped. LDL 142 and placed on high dose statin.  Follow up echo showed preserved EF with inferior wall hypokinesis. Elevated filling pressures on cath but no volume overload. He was seen by cardiac rehab and ambulated without difficulty. Seen by CM regarding his medication needs and meds sent to [facility]. Educated by PharmD prior to discharge.   Jeffrey Winters was seen by [name] and determined stable for discharge home. Follow up in the office has been arranged. Medications are listed below.   _____________  Discharge Vitals Blood pressure 114/69, pulse (!) 57, temperature 97.9 F (36.6 C), temperature source Oral, resp. rate 16, height 5\' 4"  (1.626 m), weight 101.1 kg, SpO2 99 %.  Filed Weights   07/11/19 0600 07/11/19 1419 07/12/19 0435  Weight: 98.4 kg 99.8 kg 101.1 kg    Labs & Radiologic Studies    CBC Recent Labs    07/10/19 1400 07/11/19 0247  WBC 5.3 6.4  HGB 13.9 13.7  HCT 43.1 41.9  MCV 84.8 83.3  PLT 206 [id number]   Basic Metabolic Panel Recent Labs    07/10/19 1400 07/11/19 0247  NA 139 137  K 3.6 4.0  CL 105 103  CO2 26 27  GLUCOSE 147* 125*  BUN 12 14  CREATININE 1.07 1.06  CALCIUM 8.5* 8.9   Liver Function Tests Recent Labs    07/10/19 1400  AST 19  ALT 14  ALKPHOS 108  BILITOT 0.7  PROT 6.5  ALBUMIN 3.0*   No results for input(s): LIPASE, AMYLASE in the last 72 hours. Cardiac Enzymes No results for input(s): CKTOTAL, CKMB, CKMBINDEX, TROPONINI in the last 72 hours. BNP Invalid input(s): POCBNP D-Dimer No results for input(s): DDIMER in the last 72 hours. Hemoglobin A1C Recent Labs    07/10/19 1400  HGBA1C 6.3*   Fasting Lipid Panel Recent Labs    07/10/19 1400  CHOL 215*  HDL 34*  LDLCALC 142*  TRIG 195*  CHOLHDL 6.3   Thyroid Function Tests No results for input(s): TSH, T4TOTAL, T3FREE, THYROIDAB in the last 72 hours.  Invalid input(s): FREET3 _____________  No results found. Disposition   Pt is being discharged home today in good condition.  Follow-up Plans &  Appointments    Follow-up Information    [facility]. Go on 07/25/2019.   Why: @9 :30am Contact information: [address], [address] [place] [phone]       [facility] Follow up.   Why: you can go here to get medications for discount prices  Contact information: [place] [phone]       [name] T, PA-C Follow up on 07/21/2019.   Specialties: Cardiology, Physician Assistant Why: at 8:45am for your follow up appt. Contact information: [id number] [address] [place] [address] [phone]          Discharge Instructions    Amb Referral to Cardiac Rehabilitation   Complete by: As directed    To [place]   Diagnosis:  Coronary Stents PTCA STEMI     After initial evaluation and assessments completed: Virtual Based Care may be provided alone or in conjunction with Phase 2 Cardiac Rehab based on patient barriers.: Yes   Diet - low sodium heart healthy   Complete by: As directed  Discharge instructions   Complete by: As directed    Radial Site Care Refer to this sheet in the next few weeks. These instructions provide you with information on caring for yourself after your procedure. Your caregiver may also give you more specific instructions. Your treatment has been planned according to current medical practices, but problems sometimes occur. Call your caregiver if you have any problems or questions after your procedure. HOME CARE INSTRUCTIONS You may shower the day after the procedure.Remove the bandage (dressing) and gently wash the site with plain soap and water.Gently pat the site dry.  Do not apply powder or lotion to the site.  Do not submerge the affected site in water for 3 to 5 days.  Inspect the site at least twice daily.  Do not flex or bend the affected arm for 24 hours.  No lifting over 5 pounds (2.3 kg) for 5 days  after your procedure.  Do not drive home if you are discharged the same day of the procedure. Have someone else drive you.  You may drive 24 hours after the procedure unless otherwise instructed by your caregiver.  What to expect: Any bruising will usually fade within 1 to 2 weeks.  Blood that collects in the tissue (hematoma) may be painful to the touch. It should usually decrease in size and tenderness within 1 to 2 weeks.  SEEK IMMEDIATE MEDICAL CARE IF: You have unusual pain at the radial site.  You have redness, warmth, swelling, or pain at the radial site.  You have drainage (other than a small amount of blood on the dressing).  You have chills.  You have a fever or persistent symptoms for more than 72 hours.  You have a fever and your symptoms suddenly get worse.  Your arm becomes pale, cool, tingly, or numb.  You have heavy bleeding from the site. Hold pressure on the site.   PLEASE DO NOT MISS ANY DOSES OF YOUR EFFIENT!!!!! Also keep a log of you blood pressures and bring back to your follow up appt. Please call the office with any questions.   Patients taking blood thinners should generally stay away from medicines like ibuprofen, Advil, Motrin, naproxen, and Aleve due to risk of stomach bleeding. You may take Tylenol as directed or talk to your primary doctor about alternatives.   Increase activity slowly   Complete by: As directed        Discharge Medications     Medication List    TAKE these medications   acetaminophen 500 MG tablet Commonly known as: TYLENOL Take 500 mg by mouth every 6 (six) hours as needed for mild pain.   aspirin 81 MG chewable tablet Chew 1 tablet (81 mg total) by mouth daily. Start taking on: July 13, 2019   atorvastatin 80 MG tablet Commonly known as: LIPITOR Take 1 tablet (80 mg total) by mouth daily at 6 PM.   nitroGLYCERIN 0.4 MG SL tablet Commonly known as: Nitrostat Place 1 tablet (0.4 mg total) under the tongue every 5  (five) minutes as needed.   prasugrel 10 MG Tabs tablet Commonly known as: EFFIENT Take 1 tablet (10 mg total) by mouth daily. Start taking on: July 13, 2019       Yes                               AHA/ACC Clinical Performance & Quality Measures: 5. Aspirin prescribed? - Yes 6.  ADP Receptor Inhibitor (Plavix/Clopidogrel, Brilinta/Ticagrelor or Effient/Prasugrel) prescribed (includes medically managed patients)? - Yes 7. Beta Blocker prescribed? - No - bradycardia 8. High Intensity Statin (Lipitor 40-80mg  or Crestor 20-40mg ) prescribed? - Yes 9. EF assessed during THIS hospitalization? - Yes 10. For EF <40%, was ACEI/ARB prescribed? - Not Applicable (EF >/= [id number]) 11. For EF <40%, Aldosterone Antagonist (Spironolactone or Eplerenone) prescribed? - Not Applicable (EF >/= [id number]) 12. Cardiac Rehab Phase II ordered (Included Medically managed Patients)? - Yes      Outstanding Labs/Studies   FLP/LFTs in 6 weeks  Duration of Discharge Encounter   Greater than 30 minutes including physician time.  Signed, Reino Bellis NP-C 07/12/2019, 12:29 PM

## 2019-07-12 NOTE — Discharge Instructions (Signed)
Information about your medication: Effient (anti-platelet agent)  Generic Name (Brand): prasugrel (Effient), once daily medication  PURPOSE: You are taking this medication along with aspirin to lower your chance of having a heart attack, stroke, or blood clots in your heart stent. These can be fatal. Brilinta and aspirin help prevent platelets from sticking together and forming a clot that can block an artery or your stent.   Common SIDE EFFECTS you may experience include: bruising or bleeding more easily, shortness of breath  Do not stop taking EFFIENT without talking to the doctor who prescribes it for you. People who are treated with a stent and stop taking Effient too soon, have a higher risk of getting a blood clot in the stent, having a heart attack, or dying. If you stop Effient because of bleeding, or for other reasons, your risk of a heart attack or stroke may increase.   Tell all of your doctors and dentists that you are taking Effient. They should talk to the doctor who prescribed Effient for you before you have any surgery or invasive procedure.   Contact your health care provider if you experience: severe or uncontrollable bleeding, pink/red/brown urine, vomiting blood or vomit that looks like "coffee grounds", red or black stools (looks like tar), coughing up blood or blood clots ----------------------------------------------------------------------------------------------------------------------

## 2019-07-12 NOTE — Progress Notes (Signed)
Progress Note  Patient Name: Jeffrey Winters Date of Encounter: 07/12/2019  Primary Cardiologist: [facility]  Subjective   No chest pain or dyspnea.   Inpatient Medications    Scheduled Meds: . aspirin  81 mg Oral Daily  . atorvastatin  80 mg Oral q1800  . Chlorhexidine Gluconate Cloth  6 each Topical Daily  . enoxaparin (LOVENOX) injection  40 mg Subcutaneous Q24H  . metoprolol tartrate  12.5 mg Oral BID  . prasugrel  10 mg Oral Daily  . sodium chloride flush  3 mL Intravenous Q12H   Continuous Infusions: . sodium chloride     PRN Meds: sodium chloride, acetaminophen, ondansetron (ZOFRAN) IV, sodium chloride flush   Vital Signs    Vitals:   07/11/19 1950 07/12/19 0040 07/12/19 0435 07/12/19 0800  BP: 96/61 121/69 (!) 102/54 (!) 99/58  Pulse: 81 62 62 65  Resp: '18 18 18 17  ' Temp: 98.3 F (36.8 C) 97.9 F (36.6 C) 98.2 F (36.8 C) 98.5 F (36.9 C)  TempSrc: Oral Oral Oral Oral  SpO2: 98% 97% 97% 97%  Weight:   101.1 kg   Height:        Intake/Output Summary (Last 24 hours) at 07/12/2019 0932 Last data filed at 07/12/2019 0801 Gross per 24 hour  Intake 1310 ml  Output 675 ml  Net 635 ml   Last 3 Weights 07/12/2019 07/11/2019 07/11/2019  Weight (lbs) 222 lb 14.4 oz 220 lb 216 lb 14.9 oz  Weight (kg) 101.107 kg 99.791 kg 98.4 kg      Telemetry    Sinus - Personally Reviewed  ECG    No AM EKG- Personally Reviewed  Physical Exam  Alert, oriented, in NAD GEN: No acute distress.   Neck: No JVD Cardiac: RRR, no murmurs, rubs, or gallops.  Respiratory: Clear to auscultation bilaterally. GI: Soft, nontender, non-distended  MS: No edema; No deformity. Right groin clear. Neuro:  Nonfocal  Psych: Normal affect   Labs    High Sensitivity Troponin:   Recent Labs  Lab 07/10/19 1400 07/10/19 2114 07/11/19 0247 07/11/19 0749  TROPONINIHS 50* >27,000* >27,000* >27,000*      Chemistry Recent Labs  Lab 07/10/19 1358 07/10/19 1400 07/11/19 0247   NA 140 139 137  K 3.6 3.6 4.0  CL 102 105 103  CO2  --  26 27  GLUCOSE 147* 147* 125*  BUN '12 12 14  ' CREATININE 0.90 1.07 1.06  CALCIUM  --  8.5* 8.9  PROT  --  6.5  --   ALBUMIN  --  3.0*  --   AST  --  19  --   ALT  --  14  --   ALKPHOS  --  108  --   BILITOT  --  0.7  --   GFRNONAA  --  >60 >60  GFRAA  --  >60 >60  ANIONGAP  --  8 7     Hematology Recent Labs  Lab 07/10/19 1358 07/10/19 1400 07/11/19 0247  WBC  --  5.3 6.4  RBC  --  5.08 5.03  HGB 14.6 13.9 13.7  HCT 43.0 43.1 41.9  MCV  --  84.8 83.3  MCH  --  27.4 27.2  MCHC  --  32.3 32.7  RDW  --  13.6 13.8  PLT  --  206 200    BNPNo results for input(s): BNP, PROBNP in the last 168 hours.   DDimer No results for input(s): DDIMER in the last  168 hours.   Radiology    No results found.  Cardiac Studies   Echo 07/11/19:   1. Left ventricular ejection fraction, by visual estimation, is 55%. The left ventricle has normal function. There is mildly increased left ventricular hypertrophy. Basal inferior severe hypokinesis.  2. Left ventricular diastolic parameters are consistent with Grade II diastolic dysfunction (pseudonormalization).  3. Global right ventricle has normal systolic function.The right ventricular size is normal. No increase in right ventricular wall thickness.  4. Left atrial size was normal.  5. Right atrial size was normal.  6. The mitral valve is normal in structure. Trace mitral valve regurgitation. No evidence of mitral stenosis.  7. The tricuspid valve is normal in structure. Tricuspid valve regurgitation is trivial.  8. The aortic valve is tricuspid. Aortic valve regurgitation is not visualized. No evidence of aortic valve sclerosis or stenosis.  9. The inferior vena cava is normal in size with <50% respiratory variability, suggesting right atrial pressure of 8 mmHg. 10. The tricuspid regurgitant velocity is 2.66 m/s, and with an assumed right atrial pressure of 8 mmHg, the estimated  right ventricular systolic pressure is mildly elevated at 36.3 mmHg.  Cardiac Cath: Conclusion  Conclusions: 1. Multivessel coronary artery disease with acute occlusion of distal RCA (culprit lesion) as well as 50-60% mid LAD and 80% rPL2 stenoses. 2. Patent stent in OM2. 3. Moderately to severely elevated LVEDP (30-35 mmHg). 4. Successful primary PCI to the distal RCA using a Resolute Onyx 3.0 x38 mm DES with 0% residual stenosis and TIMI-3 flow.  Recommendations: 1. Admit to 2H-ICU for post-STEMI care. 2. Obtain transthoracic echocardiogram; ventriculogram was not performed due to significantly elevated LVEDP. 3. Dual antiplatelet therapy with aspirin and prasugrel for at least 12 months. 4. Aggressive secondary prevention.  Nelva Bush, MD [facility]   Recommendations  Antiplatelet/Anticoag Recommend uninterrupted dual antiplatelet therapy with Aspirin 22m daily and Prasugrel 141mdaily for a minimum of 12 months (ACS-Class I recommendation).  Discharge Date In the absence of any other complications or medical issues, we expect the patient to be ready for discharge on 07/12/2019.  Indications  ST elevation myocardial infarction involving right coronary artery (HCC) [I21.11 (ICD-10-CM)]  Procedural Details  Technical Details Indication: 89.o. year-old man with history of coronary artery disease status post NSTEMI's in July and October of 2017 status post PCI to OM2 in 02/2016, hypertension, and tobacco use, presenting via EMS with chest pain and inferior ST segment elevation consistent with STEMI.  GFR: >60 ml/min  Procedure: The risks, benefits, complications, treatment options, and expected outcomes were discussed with the patient. The patient provided emergent verbal consent. The right groin was prepped and draped in the usual manner, as neither radial pulse was easily palpable. Ultrasound was used to evaluate the right common femoral artery. It was patent.  An  ultrasound image was saved in the permanent record. A micropuncture needle was used to access the right common femoral artery under ultrasound guidance. Using the modified Seldinger access technique, a 77F sheath was placed in the right femoral artery.  Selective coronary angiography was performed using 69F JL4 diagnostic and 77F JR4 guide catheters to engage the left and right coronary arteries, respectively.  Left heart catheterization was performed using a 69F pigtail catheter following PCI to the RCA. Left ventriculogram was not performed due to elevated LVEDP.  PCI to distal RCA: Heparin was used for anticoagulation.  The patient was loaded with prasugrel.  Via the 77F JR4 guide catheter, a  Runthrough wire was advanced into the RCA continuation.  The distal RCA occlusion was dilated using a [place] 2.5 x 12 mm balloon at 8 atm.  It became apparent that there was diffuse disease distal to the culprit lesion of up to 50%.  The decision was made to cover the entire diseased segment with a Resolute Onyx 3.0 x 38 mm drug-eluting stent, which was deployed at 12 atm and postdilated with the same balloon at 16 atm.  Further postdilation was performed using [place] Sapphire 3.5 x 15 mm and 3.5 x 12 mm balloons at up to 18 atm.  Final angiogram demonstrates 0% residual stenosis with TIMI-3 flow.  There were no immediate complications. The patient was taken to the recovery area in stable condition.   Estimated blood loss <50 mL.   During this procedure no sedation was administered.   Coronary Findings  Diagnostic Dominance: Right Left Main  Vessel is large. Vessel is angiographically normal.  Left Anterior Descending  Vessel is large.  Mid LAD lesion 55% stenosed  Mid LAD lesion is 55% stenosed.  Dist LAD lesion 40% stenosed  Dist LAD lesion is 40% stenosed.  First Diagonal Branch  Vessel is small in size.  Second Diagonal Branch  Vessel is small in size.  Third Diagonal Branch  Vessel is small in  size.  Left Circumflex  Vessel is large. The vessel exhibits minimal luminal irregularities.  First Obtuse Marginal Branch  Vessel is small in size.  Second Obtuse Marginal Branch  Vessel is large in size.  2nd Mrg lesion 0% stenosed  Previously placed 2nd Mrg stent (unknown type) is widely patent. Previously placed stent displays no restenosis.  Third Obtuse Marginal Branch  Vessel is moderate in size.  Right Coronary Artery  Vessel is large.  Mid RCA to Dist RCA lesion 100% stenosed  Mid RCA to Dist RCA lesion is 100% stenosed. Vessel is the culprit lesion. The lesion is thrombotic.  Dist RCA lesion 50% stenosed  Dist RCA lesion is 50% stenosed.  Right Posterior Descending Artery  Vessel is moderate in size.  First Right Posterolateral Branch  Vessel is small in size.  Second Right Posterolateral Branch  Vessel is moderate in size.  2nd RPL lesion 80% stenosed  2nd RPL lesion is 80% stenosed.  Intervention  Mid RCA to Dist RCA lesion  Stent (Also treats lesions: Dist RCA)  CATHETER LAUNCHER 6FR JR4 guide catheter was inserted. Pre-stent angioplasty was performed using a BALLOON SAPPHIRE 2.5X12. A drug-eluting stent was successfully placed using a STENT RESOLUTE [id number]. Maximum pressure: 16 atm. Stent strut is well apposed. Post-stent angioplasty was performed using a BALLOON SAPPHIRE [place] 3.5X12. Maximum pressure: 18 atm.  Post-Intervention Lesion Assessment  The intervention was successful. Pre-interventional TIMI flow is 0. Post-intervention TIMI flow is 3. No complications occurred at this lesion.  There is a 0% residual stenosis post intervention.  Dist RCA lesion  Stent (Also treats lesions: Mid RCA to Dist RCA)  CATHETER LAUNCHER 6FR JR4 guide catheter was inserted. Pre-stent angioplasty was performed using a BALLOON SAPPHIRE 2.5X12. A drug-eluting stent was successfully placed using a STENT RESOLUTE [id number]. Maximum pressure: 16 atm. Stent strut is well apposed.  Post-stent angioplasty was performed using a BALLOON SAPPHIRE [place] 3.5X12. Maximum pressure: 18 atm.  Post-Intervention Lesion Assessment  The intervention was successful. Pre-interventional TIMI flow is 0. Post-intervention TIMI flow is 3. No complications occurred at this lesion.  There is a 0% residual stenosis post intervention.  Left Heart  Left Ventricle Moderately to severely elevated left ventricular filling pressure (LVEDP 30-35 mmHg).  Aortic Valve There is no aortic valve stenosis.  Coronary Diagrams  Diagnostic Dominance: Right  Intervention    Patient Profile     53 y.o. male presenting with inferior STEMI 07-10-2019, treated with PCI of the distal RCA  Assessment & Plan    1. CAD/Inferior STEMI: Doing well this am. No chest pain. Will continue ASA, Effient and statin. Will stop beta blocker due to bradycardia. Cannot add an ARB due to soft BP.   2. Acute heart failure: LV systolic function overall preserved on echo with inferior wall hypokinesis. Filling pressures elevated on cath but no volume overload on exam today. No need for Lasix.   3. Mixed hyperlipidemia: Continue statin  4. Tobacco abuse: cessation counseling done  Discharge home today. Will not discharge on beta blocker or ARB due to bradycardia and soft BP  For questions or updates, please contact [place] Please consult [email or web address] for contact info under        Signed, Lauree Chandler, MD  07/12/2019, 9:32 AM

## 2019-07-12 NOTE — Telephone Encounter (Signed)
**Note De-Identified  Obfuscation** The pt is being discharged from hospital today. We will call him tomorrow.

## 2019-07-12 NOTE — TOC Progression Note (Signed)
Transition of Care [facility]) - Progression Note    Patient Details  Name: Jeffrey Winters MRN: [id number] Date of Birth: [birth year]  Transition of Care [facility]) CM/SW Contact  Zenon Mayo, RN Phone Number: 07/12/2019, 9:22 AM  Clinical Narrative:    NCM spoke with patient, he does not have a PCP or insurance, will get him a follow up apt at the [place] [facility] and [facility] will fill meds for him , NCM will ast with [employer], he states he has transportation at dc and his transportation will help him pay 3.00 each for his meds.         Expected Discharge Plan and Services                                                 Social Determinants of Health (SDOH) Interventions    Readmission Risk Interventions No flowsheet data found.

## 2019-07-13 NOTE — Telephone Encounter (Signed)
**Note De-Identified  Obfuscation** I called the only number listed in the pts chart to reach him at and a lady answered the phone and stated "you called the wrong number".  The second time I called the lady answered again and stated "You are calling the wrong number and I work 3rd shift so you are waking me up". I apologized to her and confirmed that I am dialing the correct number of [phone] and she confirmed that I am but it is the wrong number to reach this pt.  There is no other numbers listed to call the pt. To contact him.

## 2019-07-20 NOTE — Progress Notes (Signed)
Cardiology Office Note:    Date:  07/21/2019   ID:  Jeffrey Winters, DOB 1965/08/11, MRN [id number]  PCP:  Patient, No Pcp Per  Cardiologist:  Sherren Mocha, MD   Electrophysiologist:  None   Referring MD: No ref. provider found   Chief Complaint  Patient presents with  . Hospitalization Follow-up    s/p IMI >> DES to RCA    History of Present Illness:    Jeffrey Winters is a 53 y.o. male with:   Coronary artery disease  S/p NSTEMI in 02/2016 tx with Xience DES to OM1  s/p NSTEMI in 05/2016  S/p Inf STEMI 07/2019 >> s/p DES to RCA; OM1 stent patent  Hypertension   Hyperlipidemia   Tobacco use  Jeffrey Winters was admitted 12/7-12/9 with an inferior STEMI.  Cardiac catheterization demonstratd an occluded RCA which was treated with a DES.  The OM2 stent was patent.  There was residual disease with 55% mid LAD and 80 % RPL2 stenosis.  This is to be managed medically.  He returns for follow-up.  He was doing well for several days after discharge.  However, earlier this week, he had an episode of feeling lightheaded and dizzy.  He did not have syncope.  He sat down and eventually felt better.  He felt somewhat short of breath with this.  He has not had any further symptoms.  He has been walking without discomfort in his chest.  He does get short of breath sometimes with walking.  He has felt his heart racing.  He started having some discomfort in his chest yesterday after eating.  This seems to all be related to meals.  He does not like his previous angina.  He has not had orthopnea, PND or edema.  He does note bilateral calf pain with walking and questions whether or not he has peripheral arterial disease.   Prior CV studies:   The following studies were reviewed today:   Echocardiogram 07/11/2019 EF 55, mild LVH, inf HK, Gr 2 DD, normal RVSF, trace MR, trivial TR, RVSP 36.3  Cardiac catheterization 07/10/2019 LAD mid 55, dist 40 LCx irregs; OM2 stent patent RCA mid 100, dist 50;  RPL2 80 PCI: 3 x 38 mm Resolute Onyx DES to mid RCA    Cardiac catheterization 02/26/16 ([place]) Findings: 1. Coronary artery disease (as detailed below) including a 99% large OM2  stenosis and mild CAD in the LAD and RCA. 2. Successful coronary intervention to the OM2 stenosis with a 2.5 x 18 mm  Xience drug eluting stent with 0% residual stenosis and TIMI III flow post  procedure.   Past Medical History:  Diagnosis Date  . Coronary artery disease 2017   MI s/p PCI to OM2  . Hypertension    Surgical Hx: The patient  has a past surgical history that includes Percutaneous coronary stent intervention (pci-s) (2017); Coronary/Graft Acute MI Revascularization (N/A, 07/10/2019); and LEFT HEART CATH AND CORONARY ANGIOGRAPHY (N/A, 07/10/2019).   Current Medications: Current Meds  Medication Sig  . acetaminophen (TYLENOL) 500 MG tablet Take 500 mg by mouth every 6 (six) hours as needed for mild pain.  Marland Kitchen aspirin 81 MG chewable tablet Chew 1 tablet (81 mg total) by mouth daily.  Marland Kitchen atorvastatin (LIPITOR) 80 MG tablet Take 1 tablet (80 mg total) by mouth daily at 6 PM.  . nitroGLYCERIN (NITROSTAT) 0.4 MG SL tablet Place 1 tablet (0.4 mg total) under the tongue every 5 (five) minutes as needed.  . prasugrel (  EFFIENT) 10 MG TABS tablet Take 1 tablet (10 mg total) by mouth daily.     Allergies:   Patient has no known allergies.   Social History   Tobacco Use  . Smoking status: Current Every Day Smoker    Packs/day: 1.00  . Smokeless tobacco: Never Used  Substance Use Topics  . Alcohol use: Not Currently  . Drug use: No     Family Hx: The patient's family history includes Heart disease in his father and mother.  ROS:   Please see the history of present illness.    ROS All other systems reviewed and are negative.   EKGs/Labs/Other Test Reviewed:    EKG:  EKG is  ordered today.  The ekg ordered today demonstrates normal sinus rhythm, heart rate 86, normal axis, inferior Q  waves, nonspecific ST-T wave changes, QTC 416  Recent Labs: 07/10/2019: ALT 14 07/11/2019: BUN 14; Creatinine, Ser 1.06; Hemoglobin 13.7; Platelets 200; Potassium 4.0; Sodium 137   Recent Lipid Panel Lab Results  Component Value Date/Time   CHOL 215 (H) 07/10/2019 02:00 PM   TRIG 195 (H) 07/10/2019 02:00 PM   HDL 34 (L) 07/10/2019 02:00 PM   CHOLHDL 6.3 07/10/2019 02:00 PM   LDLCALC 142 (H) 07/10/2019 02:00 PM    Physical Exam:    VS:  BP 128/78   Ht 5\' 4"  (1.626 m)   Wt 223 lb (101.2 kg)   SpO2 95%   BMI 38.28 kg/m     Wt Readings from Last 3 Encounters:  07/21/19 223 lb (101.2 kg)  07/12/19 222 lb 14.4 oz (101.1 kg)  12/11/15 206 lb 7 oz (93.6 kg)     Physical Exam  Constitutional: He is oriented to person, place, and time. He appears well-developed and well-nourished. No distress.  HENT:  Head: Normocephalic and atraumatic.  Eyes: No scleral icterus.  Neck: No JVD present. No thyromegaly present.  Cardiovascular: Normal rate, regular rhythm and normal heart sounds.  No murmur heard. Pulmonary/Chest: Effort normal and breath sounds normal. He has no rales.  Abdominal: Soft. There is no hepatomegaly.  Musculoskeletal:        General: No edema.     Comments: R groin without hematoma or bruit  Lymphadenopathy:    He has no cervical adenopathy.  Neurological: He is alert and oriented to person, place, and time.  Skin: Skin is warm and dry.  Psychiatric: He has a normal mood and affect.    ASSESSMENT & PLAN:    1. ST elevation myocardial infarction involving right coronary artery [facility]) History of prior myocardial infarction in 2017 treated with a drug-eluting stent to the OM1 at [facility] in [place].  He is status post recent admission with inferior MI treated with DES to the RCA.  He was not placed on beta-blocker therapy in the hospital due to bradycardia and hypotension.  He has had some discomfort and shortness of breath.  Question if this could be related  to the residual disease in RPL2.  He also has some symptoms that sound like indigestion.  ECG does not demonstrate any acute changes.  I reviewed his ECG with [name] who agreed.  His heart rate is much higher now and I believe he could tolerate low-dose beta-blocker therapy.  We discussed the importance of beta-blocker therapy post MI.  We also discussed the importance of dual antiplatelet therapy.  -Continue aspirin, prasugrel, atorvastatin  -Start metoprolol succinate 25 mg every evening  -Refer to cardiac rehab  -  Follow-up with me in 2 weeks  2. Essential hypertension Blood pressure is controlled.  3. Hyperlipidemia, unspecified hyperlipidemia type Continue high-dose statin.  Follow-up in 2 weeks, arrange repeat fasting lipids and LFTs  4. Gastroesophageal reflux disease without esophagitis He notes some discomfort in his chest after meals.  I suspect he is having some issues with indigestion related to his new medications.  Start Pepcid 20 mg twice daily.  5.  Shortness of breath He does note some issues with shortness of breath.  We discussed the importance of quitting smoking.  As noted, I will place him on Toprol.  This should help manage any symptoms he may be having from residual disease in the RPL2.  At cath, his LVEDP was elevated.  I will also obtain BMET, CBC, BMP today.  6.  Claudication He notes bilateral calf pain with walking.  Obtain ABIs/arterial Dopplers.   Dispo:  Return in about 2 weeks (around 08/04/2019) for Close Follow Up, w/ [name], PA-C, in person.   Medication Adjustments/Labs and Tests Ordered: Current medicines are reviewed at length with the patient today.  Concerns regarding medicines are outlined above.  Tests Ordered: Orders Placed This Encounter  Procedures  . Pro b natriuretic peptide  . Basic Metabolic Panel (BMET)  . CBC  . EKG 12-Lead  . VAS [place] ABI WITH/WO TBI  . VAS [place] LOWER EXTREMITY ARTERIAL DUPLEX   Medication Changes: Meds  ordered this encounter  Medications  . famotidine (PEPCID) 20 MG tablet    Sig: Take 1 tablet (20 mg total) by mouth 2 (two) times daily.    Dispense:  180 tablet    Refill:  1  . metoprolol succinate (TOPROL XL) 25 MG 24 hr tablet    Sig: Take 1 tablet (25 mg total) by mouth daily.    Dispense:  90 tablet    Refill:  1    Signed, [name], PA-C  07/21/2019 2:11 PM    [facility] [place], [place], [place]  [address] Phone: [phone]; Fax: [phone]

## 2019-07-21 ENCOUNTER — Other Ambulatory Visit: Payer: Self-pay

## 2019-07-21 ENCOUNTER — Encounter: Payer: Self-pay | Admitting: Physician Assistant

## 2019-07-21 ENCOUNTER — Ambulatory Visit (INDEPENDENT_AMBULATORY_CARE_PROVIDER_SITE_OTHER): Payer: Self-pay | Admitting: Physician Assistant

## 2019-07-21 VITALS — BP 128/78 | Ht 64.0 in | Wt 223.0 lb

## 2019-07-21 DIAGNOSIS — I2111 ST elevation (STEMI) myocardial infarction involving right coronary artery: Secondary | ICD-10-CM

## 2019-07-21 DIAGNOSIS — K219 Gastro-esophageal reflux disease without esophagitis: Secondary | ICD-10-CM

## 2019-07-21 DIAGNOSIS — R0602 Shortness of breath: Secondary | ICD-10-CM

## 2019-07-21 DIAGNOSIS — I739 Peripheral vascular disease, unspecified: Secondary | ICD-10-CM

## 2019-07-21 DIAGNOSIS — I10 Essential (primary) hypertension: Secondary | ICD-10-CM

## 2019-07-21 DIAGNOSIS — E785 Hyperlipidemia, unspecified: Secondary | ICD-10-CM

## 2019-07-21 MED ORDER — METOPROLOL SUCCINATE ER 25 MG PO TB24: 25.0000 mg | ORAL_TABLET | Freq: Every day | ORAL | 1 refills | Status: DC

## 2019-07-21 MED ORDER — FAMOTIDINE 20 MG PO TABS: 20.0000 mg | ORAL_TABLET | Freq: Two times a day (BID) | ORAL | 1 refills | Status: DC

## 2019-07-21 MED FILL — METOPROLOL SUCCINATE ER 25: 25 | 30 days supply | Qty: 30 | Fill #0

## 2019-07-21 NOTE — Patient Instructions (Addendum)
Medication Instructions:   Your physician has recommended you make the following change in your medication:   1) Start Pepcid 20MG , 1 tablet by mouth twice a day 2) Start Toprol XL 25MG , 1 tablet by mouth once a day  *If you need a refill on your cardiac medications before your next appointment, please call your pharmacy*  Lab Work:  You will have labs drawn today: BMET, CBC, BNP  If you have labs (blood work) drawn today and your tests are completely normal, you will receive your results only by: Marland Kitchen MyChart Message (if you have MyChart) OR . A paper copy in the mail If you have any lab test that is abnormal or we need to change your treatment, we will call you to review the results.  Testing/Procedures:  Your physician has requested that you have an ankle brachial index (ABI). During this test an ultrasound and blood pressure cuff are used to evaluate the arteries that supply the arms and legs with blood. Allow thirty minutes for this exam. There are no restrictions or special instructions.  Your physician has requested that you have a lower extremity arterial duplex. This test is an ultrasound of the arteries in the legs. It looks at arterial blood flow in the legs and arms. Allow one hour for Lower and Upper Arterial scans. There are no restrictions or special instructions   Follow-Up:  On 08/11/19 at 8:45AM with [name], PA-C  Other Instructions  We will be referring you to [facility] for Cardiac Rehab. You will be hearing from them to schedule an appointment.

## 2019-07-22 LAB — CBC
Hematocrit: 43.1 % (ref 37.5–51.0)
Hemoglobin: 14.2 g/dL (ref 13.0–17.7)
MCH: 27.1 pg (ref 26.6–33.0)
MCHC: 32.9 g/dL (ref 31.5–35.7)
MCV: 82 fL (ref 79–97)
Platelets: 253 10*3/uL (ref 150–450)
RBC: 5.24 x10E6/uL (ref 4.14–5.80)
RDW: 13.5 % (ref 11.6–15.4)
WBC: 5.8 10*3/uL (ref 3.4–10.8)

## 2019-07-22 LAB — BASIC METABOLIC PANEL
BUN/Creatinine Ratio: 13 (ref 9–20)
BUN: 13 mg/dL (ref 6–24)
CO2: 22 mmol/L (ref 20–29)
Calcium: 9.2 mg/dL (ref 8.7–10.2)
Chloride: 99 mmol/L (ref 96–106)
Creatinine, Ser: 0.98 mg/dL (ref 0.76–1.27)
GFR calc Af Amer: 101 mL/min/{1.73_m2} (ref >59)
GFR calc non Af Amer: 88 mL/min/{1.73_m2} (ref >59)
Glucose: 89 mg/dL (ref 65–99)
Potassium: 4.3 mmol/L (ref 3.5–5.2)
Sodium: 136 mmol/L (ref 134–144)

## 2019-07-22 LAB — PRO B NATRIURETIC PEPTIDE: NT-Pro BNP: 233 pg/mL — ABNORMAL HIGH (ref 0–121)

## 2019-07-23 NOTE — Progress Notes (Signed)
Patient ID: Jeffrey Winters, male   DOB: 05-09-1966, 53 y.o.   MRN: [id number]  Virtual Visit via Telephone Note  I connected with [name] on 07/25/19 at  9:30 AM EST by telephone and verified that I am speaking with the correct person using two identifiers.   I discussed the limitations, risks, security and privacy concerns of performing an evaluation and management service by telephone and the availability of in person appointments. I also discussed with the patient that there may be a patient responsible charge related to this service. The patient expressed understanding and agreed to proceed.  Patient location:  home My Location:  [place] office Persons on the call:  Me and the patient.   History of Present Illness: After hospitalization for NSTEMI 12/7-12/04/2019.  Had cardiology f/up 12/18(see A/P below).  Cardiology performed f/up labs.  Patient is doing well.  He is down to 4 cigarettes a day!!  Compliant with medication regimen.  No fever.  No SOB.  No CP.  Declines flu shot.    From discharge summary: 53 y.o.malewith coronary artery disease status post NSTEMI's in July and October of 2017 status post PCI to OM2 in7/2017, hypertension, and tobacco use, who presented with acute onset of chest pain and inferior ST segment elevation on EKG concerning for inferior STEMI.  Mr.Garnerreported that he was in his usual state of health until approximately 1130 that morning when he developed acute onset of left-sided chest pain at rest. He described it as a cramping feeling radiating to the left shoulder and upper back accompanied by diaphoresis. He initially tried to wait the pain out, though his mom subsequently called 911. When EMS arrived, patient was noted to have inferior ST segment elevation. He received 2 sublingual nitroglycerin tabs, which helped improve his chest pain from 8/10 to6/10.  Due to continued chest pain and inferior ST segment elevation on EKG, he was taken for  emergent cardiac catheterization.   Cardiac cath revealed an occluded distal RCA as well as noncritical lesions involving the mid LAD and rPL2. [name] underwent primary PCI to the distal RCA with resolution of chest pain. HsT peaked at > 27,000x3. Placed on DAPT with ASA/Effient for at least one year. Attempted to start on low dose metoprolol but HRs remained to low, therefore was stopped. LDL 142 and placed on high dose statin. Follow up echo showed preserved EF with inferior wall hypokinesis. Elevated filling pressures on cath but no volume overload. He was seen by cardiac rehab and ambulated without difficulty. Seen by CM regarding his medication needs and meds sent to [facility]. Educated by PharmD prior to discharge.   [name] was seen by [name] and determined stable.  Cardiology outpatient f/up 12/18: 1. ST elevation myocardial infarction involving right coronary artery [facility]) History of prior myocardial infarction in 2017 treated with a drug-eluting stent to the OM1 at [facility] in [place].  He is status post recent admission with inferior MI treated with DES to the RCA.  He was not placed on beta-blocker therapy in the hospital due to bradycardia and hypotension.  He has had some discomfort and shortness of breath.  Question if this could be related to the residual disease in RPL2.  He also has some symptoms that sound like indigestion.  ECG does not demonstrate any acute changes.  I reviewed his ECG with [name] who agreed.  His heart rate is much higher now and I believe he could tolerate low-dose beta-blocker therapy.  We discussed the importance of beta-blocker therapy post MI.  We also discussed the importance of dual antiplatelet therapy.             -Continue aspirin, prasugrel, atorvastatin             -Start metoprolol succinate 25 mg every evening             -Refer to cardiac rehab             -Follow-up with me in 2 weeks  2. Essential  hypertension Blood pressure is controlled.  3. Hyperlipidemia, unspecified hyperlipidemia type Continue high-dose statin.  Follow-up in 2 weeks, arrange repeat fasting lipids and LFTs  4. Gastroesophageal reflux disease without esophagitis He notes some discomfort in his chest after meals.  I suspect he is having some issues with indigestion related to his new medications.  Start Pepcid 20 mg twice daily.  5.  Shortness of breath He does note some issues with shortness of breath.  We discussed the importance of quitting smoking.  As noted, I will place him on Toprol.  This should help manage any symptoms he may be having from residual disease in the RPL2.  At cath, his LVEDP was elevated.  I will also obtain BMET, CBC, BMP today.  6.  Claudication He notes bilateral calf pain with walking.  Obtain ABIs/arterial Dopplers.   Dispo:  Return in about 2 weeks (around 08/04/2019) for Close Follow Up, w/ [name], PA-C, in person.   Medication Adjustments/Labs and Tests Ordered: Current medicines are reviewed at length with the patient today.  Concerns regarding medicines are outlined above.     Observations/Objective:  NAD.  A&O x3   Assessment and Plan: 1. ST elevation myocardial infarction involving right coronary artery [facility]) Continue cardiology f/up and current medication regimen.  2. Smoking Down to 4 cis/day.  Continue with cessation.    3. Hospital discharge follow-up Doing well.  Declines flu shot    Follow Up Instructions: 2 months assign PCP;  Sooner if needed   I discussed the assessment and treatment plan with the patient. The patient was provided an opportunity to ask questions and all were answered. The patient agreed with the plan and demonstrated an understanding of the instructions.   The patient was advised to call back or seek an in-person evaluation if the symptoms worsen or if the condition fails to improve as anticipated.  I provided 11 minutes of  non-face-to-face time during this encounter.   [name], PA-C

## 2019-07-25 ENCOUNTER — Ambulatory Visit (INDEPENDENT_AMBULATORY_CARE_PROVIDER_SITE_OTHER): Payer: Self-pay | Admitting: Physician Assistant

## 2019-07-25 DIAGNOSIS — Z09 Encounter for follow-up examination after completed treatment for conditions other than malignant neoplasm: Secondary | ICD-10-CM

## 2019-07-25 DIAGNOSIS — I2111 ST elevation (STEMI) myocardial infarction involving right coronary artery: Secondary | ICD-10-CM

## 2019-07-25 DIAGNOSIS — F17200 Nicotine dependence, unspecified, uncomplicated: Secondary | ICD-10-CM

## 2019-08-07 ENCOUNTER — Other Ambulatory Visit: Payer: Self-pay | Admitting: Physician Assistant

## 2019-08-07 MED ORDER — METOPROLOL SUCCINATE ER 25 MG PO TB24: 25.0000 mg | ORAL_TABLET | Freq: Every day | ORAL | 3 refills | Status: DC

## 2019-08-07 MED ORDER — ATORVASTATIN CALCIUM 80 MG PO TABS: 80.0000 mg | ORAL_TABLET | Freq: Every day | ORAL | 3 refills | Status: DC

## 2019-08-07 MED FILL — METOPROLOL SUCCINATE ER 25: 25 | 30 days supply | Qty: 30 | Fill #1

## 2019-08-07 NOTE — Telephone Encounter (Signed)
*  STAT* If patient is at the pharmacy, call can be transferred to refill team.   1. Which medications need to be refilled? (please list name of each medication and dose if known)  prasugrel (EFFIENT) 10 MG TABS tablet metoprolol succinate (TOPROL XL) 25 MG 24 hr tablet atorvastatin (LIPITOR) 80 MG tablet  2. Which pharmacy/location (including street and city if local pharmacy) is medication to be sent to? [place], [place]  3. Do they need a 30 day or 90 day supply? 30 day

## 2019-08-08 MED ORDER — PRASUGREL HCL 10 MG PO TABS: 10.0000 mg | ORAL_TABLET | Freq: Every day | ORAL | 3 refills | Status: DC

## 2019-08-08 MED FILL — ATORVASTATIN 80 MG TABLET: 80 | 90 days supply | Qty: 90 | Fill #0

## 2019-08-09 ENCOUNTER — Ambulatory Visit (HOSPITAL_COMMUNITY)
Admission: RE | Admit: 2019-08-09 | Discharge: 2019-08-09 | Disposition: A | Discharge status: Home / Self Care | Payer: Self-pay | Source: Ambulatory Visit | Attending: Cardiology | Admitting: Cardiology

## 2019-08-09 ENCOUNTER — Encounter (INDEPENDENT_AMBULATORY_CARE_PROVIDER_SITE_OTHER): Payer: Self-pay

## 2019-08-09 ENCOUNTER — Other Ambulatory Visit: Payer: Self-pay

## 2019-08-09 DIAGNOSIS — I739 Peripheral vascular disease, unspecified: Secondary | ICD-10-CM | POA: Insufficient documentation

## 2019-08-09 MED FILL — PRASUGREL 10 MG TABLET: 10 | 30 days supply | Qty: 30 | Fill #0

## 2019-08-10 ENCOUNTER — Encounter: Payer: Self-pay | Admitting: Physician Assistant

## 2019-08-10 DIAGNOSIS — I739 Peripheral vascular disease, unspecified: Secondary | ICD-10-CM | POA: Insufficient documentation

## 2019-08-14 NOTE — Progress Notes (Signed)
Cardiology Office Note:    Date:  08/15/2019   ID:  Jeffrey Winters, DOB March 14, 1966, MRN [id number]  PCP:  Patient, No Pcp Per  Cardiologist:  Sherren Mocha, MD  Electrophysiologist:  None   Referring MD: No ref. provider found   Chief Complaint  Patient presents with  . Follow-up    CAD    History of Present Illness:    Jeffrey Winters is a 54 y.o. male with:   Coronary artery disease ? S/p NSTEMI in 02/2016 tx with Xience DES to OM1 ? s/p NSTEMI in 05/2016 ? S/p Inf STEMI 07/2019 >> s/p DES to RCA; OM1 stent patent  Hypertension   Hyperlipidemia   Tobacco use   Jeffrey Winters was admitted in 07/2019 with an inferior STEMI treated with a DES to the RCA.  He was last seen 07/21/2019 for post hospital follow up.  He complained of chest pain, shortness of breath and bilateral lower extremity claudication.  A BNP was not significantly elevated.  His symptoms of chest pain seemed to be related to meals and I placed him on a H2RA.  He has residual 80% in a RPL2.  I started him on Metoprolol succinate as an antianginal.  His LE arterial dopplers were abnormal and he has been referred to PV.  He returns for follow-up.  He has not had any further chest discomfort related to meals.  However, he had an episode of palpitations with assoc chest discomfort last night.  This lasted about 1 to 2 hours.  There were some features that reminded him of his heart attack.  However, there were some differences as well.  He notes a long history of rapid palpitations.  These have been ongoing for about 1 to 2 years.  He did not try any nitroglycerin.  He did not have any associated nausea or diaphoresis.  He did feel somewhat short of breath.  There were no radiating symptoms.  Today, he feels fine.  He has been walking on a regular basis without exertional chest discomfort or shortness of breath.  He has not had syncope, orthopnea, paroxysmal nocturnal dyspnea or leg swelling.  Prior CV studies:   The following  studies were reviewed today:   LE Art [place] 08/2019:  R SFA 50-74; L SFA 50-74  Echocardiogram 07/11/2019 EF 55, mild LVH, inf HK, Gr 2 DD, normal RVSF, trace MR, trivial TR, RVSP 36.3  Cardiac catheterization 07/10/2019 LAD mid 8, dist 40 LCx irregs; OM2 stent patent RCA mid 100, dist 50; RPL2 80 PCI: 3 x 38 mm Resolute Onyx DES to mid RCA    Cardiac catheterization 02/26/16 ([place]) Findings: 1. Coronary artery disease (as detailed below) including a 99% large OM2  stenosis and mild CAD in the LAD and RCA. 2. Successful coronary intervention to the OM2 stenosis with a 2.5 x 18 mm  Xience drug eluting stent with 0% residual stenosis and TIMI III flow post  Procedure.  Past Medical History:  Diagnosis Date  . Coronary artery disease 2017   MI s/p PCI to OM2  . Hypertension   . PAD (peripheral artery disease) ([place])    LE Art [place] 08/2019: R SFA 50-74; L SFA 50-74   Surgical Hx: The patient  has a past surgical history that includes Percutaneous coronary stent intervention (pci-s) (2017); Coronary/Graft Acute MI Revascularization (N/A, 07/10/2019); and LEFT HEART CATH AND CORONARY ANGIOGRAPHY (N/A, 07/10/2019).   Current Medications: Current Meds  Medication Sig  . aspirin 81 MG chewable  tablet Chew 1 tablet (81 mg total) by mouth daily.  . famotidine (PEPCID) 20 MG tablet Take 1 tablet (20 mg total) by mouth 2 (two) times daily.  . metoprolol succinate (TOPROL XL) 25 MG 24 hr tablet Take 1 tablet by mouth once a day, may take an additional tablet as needed for palpitations  . nitroGLYCERIN (NITROSTAT) 0.4 MG SL tablet Place 1 tablet (0.4 mg total) under the tongue every 5 (five) minutes as needed.  . prasugrel (EFFIENT) 10 MG TABS tablet Take 1 tablet (10 mg total) by mouth daily.  . [DISCONTINUED] metoprolol succinate (TOPROL XL) 25 MG 24 hr tablet Take 1 tablet (25 mg total) by mouth daily.  . [DISCONTINUED] metoprolol succinate (TOPROL XL) 25 MG 24 hr tablet Take 1  tablet by mouth once a day, may take an additional tablet as needed for palpitations     Allergies:   Patient has no known allergies.   Social History   Tobacco Use  . Smoking status: Current Every Day Smoker  . Smokeless tobacco: Never Used  . Tobacco comment: 4 cigs/day  Substance Use Topics  . Alcohol use: Not Currently  . Drug use: No     Family Hx: The patient's family history includes Heart disease in his father and mother.  ROS:   Please see the history of present illness.    ROS All other systems reviewed and are negative.   EKGs/Labs/Other Test Reviewed:    EKG:  EKG is  ordered today.  The ekg ordered today demonstrates sinus bradycardia, HR 59, normal axis, inferior Q waves, nonspecific ST-T wave changes, inferior T wave inversions noted on ECG 07/21/2019 have resolved.  QTC 392  Recent Labs: 07/10/2019: ALT 14 07/21/2019: BUN 13; Creatinine, Ser 0.98; Hemoglobin 14.2; NT-Pro BNP 233; Platelets 253; Potassium 4.3; Sodium 136   Recent Lipid Panel Lab Results  Component Value Date/Time   CHOL 215 (H) 07/10/2019 02:00 PM   TRIG 195 (H) 07/10/2019 02:00 PM   HDL 34 (L) 07/10/2019 02:00 PM   CHOLHDL 6.3 07/10/2019 02:00 PM   LDLCALC 142 (H) 07/10/2019 02:00 PM    Physical Exam:    VS:  BP 122/66   Pulse 75   Ht 5\' 4"  (1.626 m)   Wt 223 lb (101.2 kg)   SpO2 98%   BMI 38.28 kg/m     Wt Readings from Last 3 Encounters:  08/15/19 223 lb (101.2 kg)  07/21/19 223 lb (101.2 kg)  07/12/19 222 lb 14.4 oz (101.1 kg)     Physical Exam  Constitutional: He is oriented to person, place, and time. He appears well-developed and well-nourished. No distress.  HENT:  Head: Normocephalic and atraumatic.  Eyes: No scleral icterus.  Neck: No JVD present. No thyromegaly present.  Cardiovascular: Normal rate, regular rhythm and normal heart sounds. Exam reveals no friction rub.  No murmur heard. Pulmonary/Chest: Effort normal and breath sounds normal. He has no rales.  He exhibits no tenderness.  Abdominal: Soft. There is no hepatomegaly. There is no abdominal tenderness.  Musculoskeletal:        General: No edema.  Lymphadenopathy:    He has no cervical adenopathy.  Neurological: He is alert and oriented to person, place, and time.  Skin: Skin is warm and dry.  Psychiatric: He has a normal mood and affect.    ASSESSMENT & PLAN:    1. Other chest pain 2. Palpitations As noted, he had an episode of rapid palpitations with assoc chest discomfort  last night.  His ECG today actually looks better than it did last time.  He is currently pain-free and has been able to exert himself without chest pain or shortness of breath.  Question if he is having tachyarrhythmia exacerbating angina given his known residual disease (80% RPL 2).  He does drink a lot of caffeine.  His heart rate will not tolerate further increase in his metoprolol succinate.  -Advised him to take an extra Toprol 25 mg as needed for rapid palpitations  -Obtain BMET, magnesium, TSH  -Reduce caffeine  -Obtain 30-day event monitor, live telemetry  -[place]  3. Coronary artery disease involving native coronary artery of native heart with angina pectoris [facility]) History of prior myocardial infarction in 2017 treated with a drug-eluting stent to the OM1 at [facility] in [place].  He was admitted in December 2020 with an inferior MI treated with DES to the RCA.    He has had some episodes of chest discomfort since discharge from the hospital.  At last visit, he seemed to be having acid indigestion.  This component is currently resolved on H2 receptor antagonist therapy.  He had an episode of rapid palpitations with associated chest discomfort last night.  Question if tachyarrhythmia is contributing to angina in light of his residual disease as outlined above.  At this point, his ECG does not demonstrate any significant changes (it is actually improved) and he appears stable.   Therefore, I do not believe he needs any further intervention today.  However, I will arrange for a Lexiscan Myoview to rule out significant ischemia in the LAD or LCx territories.  -Continue aspirin, prasugrel, atorvastatin, metoprolol succinate  -Arrange Lexiscan Myoview  4. PAD (peripheral artery disease) ([place]) He has bilateral calf claudication.  His LE arterial dopplers were abnormal.  He has an appointment pending with Dr. Gwenlyn Found later this month.  5. Essential hypertension The patient's blood pressure is controlled on his current regimen.  Continue current therapy.   6. Hyperlipidemia, unspecified hyperlipidemia type Continue high-dose statin therapy.  Arrange fasting lipids, LFTs in 2 months.  7. Gastroesophageal reflux disease without esophagitis Controlled on famotidine.  8. Tobacco abuse We discussed the importance of quitting.   Dispo:  Return in about 3 months (around 11/13/2019) for Routine Follow Up, w/ [name], in person.   Medication Adjustments/Labs and Tests Ordered: Current medicines are reviewed at length with the patient today.  Concerns regarding medicines are outlined above.  Tests Ordered: Orders Placed This Encounter  Procedures  . Basic Metabolic Panel (BMET)  . Magnesium  . TSH  . Lipid Profile  . Hepatic function panel  . Myocardial Perfusion Imaging  . Cardiac event monitor  . EKG 12-Lead   Medication Changes: Meds ordered this encounter  Medications  . DISCONTD: metoprolol succinate (TOPROL XL) 25 MG 24 hr tablet    Sig: Take 1 tablet by mouth once a day, may take an additional tablet as needed for palpitations    Dispense:  120 tablet    Refill:  3  . metoprolol succinate (TOPROL XL) 25 MG 24 hr tablet    Sig: Take 1 tablet by mouth once a day, may take an additional tablet as needed for palpitations    Dispense:  120 tablet    Refill:  3    Signed, [name], PA-C  08/15/2019 2:53 PM    [facility] [address], [place], [place]  [address] Phone: [phone];  Fax: [phone]

## 2019-08-15 ENCOUNTER — Telehealth: Payer: Self-pay | Admitting: Radiology

## 2019-08-15 ENCOUNTER — Encounter: Payer: Self-pay | Admitting: Physician Assistant

## 2019-08-15 ENCOUNTER — Other Ambulatory Visit: Payer: Self-pay

## 2019-08-15 ENCOUNTER — Ambulatory Visit (INDEPENDENT_AMBULATORY_CARE_PROVIDER_SITE_OTHER): Payer: Self-pay | Admitting: Physician Assistant

## 2019-08-15 VITALS — BP 122/66 | HR 75 | Ht 64.0 in | Wt 223.0 lb

## 2019-08-15 DIAGNOSIS — I25119 Atherosclerotic heart disease of native coronary artery with unspecified angina pectoris: Secondary | ICD-10-CM

## 2019-08-15 DIAGNOSIS — I10 Essential (primary) hypertension: Secondary | ICD-10-CM

## 2019-08-15 DIAGNOSIS — I739 Peripheral vascular disease, unspecified: Secondary | ICD-10-CM

## 2019-08-15 DIAGNOSIS — K219 Gastro-esophageal reflux disease without esophagitis: Secondary | ICD-10-CM

## 2019-08-15 DIAGNOSIS — R002 Palpitations: Secondary | ICD-10-CM

## 2019-08-15 DIAGNOSIS — Z720 Tobacco use: Secondary | ICD-10-CM

## 2019-08-15 DIAGNOSIS — E785 Hyperlipidemia, unspecified: Secondary | ICD-10-CM

## 2019-08-15 DIAGNOSIS — R0789 Other chest pain: Secondary | ICD-10-CM

## 2019-08-15 LAB — BASIC METABOLIC PANEL
BUN/Creatinine Ratio: 13 (ref 9–20)
BUN: 13 mg/dL (ref 6–24)
CO2: 25 mmol/L (ref 20–29)
Calcium: 9.5 mg/dL (ref 8.7–10.2)
Chloride: 100 mmol/L (ref 96–106)
Creatinine, Ser: 1 mg/dL (ref 0.76–1.27)
GFR calc Af Amer: 99 mL/min/{1.73_m2} (ref >59)
GFR calc non Af Amer: 86 mL/min/{1.73_m2} (ref >59)
Glucose: 92 mg/dL (ref 65–99)
Potassium: 4.6 mmol/L (ref 3.5–5.2)
Sodium: 139 mmol/L (ref 134–144)

## 2019-08-15 LAB — TSH: TSH: 1.81 u[IU]/mL (ref 0.450–4.500)

## 2019-08-15 LAB — MAGNESIUM: Magnesium: 1.9 mg/dL (ref 1.6–2.3)

## 2019-08-15 MED ORDER — METOPROLOL SUCCINATE ER 25 MG PO TB24: ORAL_TABLET | ORAL | 3 refills | Status: DC

## 2019-08-15 MED FILL — METOPROLOL SUCCINATE ER 25: 25 | 30 days supply | Qty: 60 | Fill #0

## 2019-08-15 NOTE — Telephone Encounter (Signed)
Enrolled patient for a 30 day [employer] Event monitor to be mailed to patients home.  

## 2019-08-15 NOTE — Patient Instructions (Addendum)
Medication Instructions:   Your physician has recommended you make the following change in your medication:   1) You can take an extra dose of your Metoprolol 25MG  as needed for palpitations  *If you need a refill on your cardiac medications before your next appointment, please call your pharmacy*  Lab Work:  You will have labs drawn today: BMET, TSH, Magnesium  Your physician recommends that you return for lab work in: 2-3 months for fasting lipids   If you have labs (blood work) drawn today and your tests are completely normal, you will receive your results only by: Marland Kitchen MyChart Message (if you have MyChart) OR . A paper copy in the mail If you have any lab test that is abnormal or we need to change your treatment, we will call you to review the results.  Testing/Procedures:  Your physician has requested that you have a lexiscan myoview. For further information please visit [email or web address]. Please follow instruction sheet, as given.  [employer] Cardiac Event Monitor Instructions Your physician has requested you wear your cardiac event monitor for __30___ days, (1-30). [employer] may call or text to confirm a shipping address. The monitor will be sent to a land address via UPS. [employer] will not ship a monitor to a PO BOX. It typically takes 3-5 days to receive your monitor after it has been enrolled. [employer] will assist with USPS tracking if your package is delayed. The telephone number for [employer] is [phone]. Once you have received your monitor, please review the enclosed instructions. Instruction tutorials can also be viewed under help and settings on the enclosed cell phone. Your monitor has already been registered assigning a specific monitor serial # to you.  Applying the monitor Remove cell phone from case and turn it on. The cell phone works as Dealer and needs to be within [employer] of you at all times. The cell phone will need to be charged on a  daily basis. We recommend you plug the cell phone into the enclosed charger at your bedside table every night.  Monitor batteries: You will receive two monitor batteries labelled #1 and #2. These are your recorders. Plug battery #2 onto the second connection on the enclosed charger. Keep one battery on the charger at all times. This will keep the monitor battery deactivated. It will also keep it fully charged for when you need to switch your monitor batteries. A small light will be blinking on the battery emblem when it is charging. The light on the battery emblem will remain on when the battery is fully charged.  Open package of a Monitor strip. Insert battery #1 into black hood on strip and gently squeeze monitor battery onto connection as indicated in instruction booklet. Set aside while preparing skin.  Choose location for your strip, vertical or horizontal, as indicated in the instruction booklet. Shave to remove all hair from location. There cannot be any lotions, oils, powders, or colognes on skin where monitor is to be applied. Wipe skin clean with enclosed Saline wipe. Dry skin completely.  Peel paper labeled #1 off the back of the Monitor strip exposing the adhesive. Place the monitor on the chest in the vertical or horizontal position shown in the instruction booklet. One arrow on the monitor strip must be pointing upward. Carefully remove paper labeled #2, attaching remainder of strip to your skin. Try not to create any folds or wrinkles in the strip as you apply it.  Firmly press and release the circle in the  center of the monitor battery. You will hear a small beep. This is turning the monitor battery on. The heart emblem on the monitor battery will light up every 5 seconds if the monitor battery in turned on and connected to the patient securely. Do not push and hold the circle down as this turns the monitor battery off. The cell phone will locate the monitor battery. A screen  will appear on the cell phone checking the connection of your monitor strip. This may read poor connection initially but change to good connection within the next minute. Once your monitor accepts the connection you will hear a series of 3 beeps followed by a climbing crescendo of beeps. A screen will appear on the cell phone showing the two monitor strip placement options. Touch the picture that demonstrates where you applied the monitor strip.  Your monitor strip and battery are waterproof. You are able to shower, bathe, or swim with the monitor on. They just ask you do not submerge deeper than 3 feet underwater. We recommend removing the monitor if you are swimming in a lake, river, or ocean.  Your monitor battery will need to be switched to a fully charged monitor battery approximately once a week. The cell phone will alert you of an action which needs to be made.  On the cell phone, tap for details to reveal connection status, monitor battery status, and cell phone battery status. The green dots indicates your monitor is in good status. A red dot indicates there is something that needs your attention.  To record a symptom, click the circle on the monitor battery. In 30-60 seconds a list of symptoms will appear on the cell phone. Select your symptom and tap save. Your monitor will record a sustained or significant arrhythmia regardless of you clicking the button. Some patients do not feel the heart rhythm irregularities. [employer] will notify us of any serious or critical events.  Refer to instruction booklet for instructions on switching batteries, changing strips, the Do not disturb or Pause features, or any additional questions.  Call [employer] at [phone], to confirm your monitor is transmitting and record your baseline. They will answer any questions you may have regarding the monitor instructions at that time.  Returning the monitor to [place] all equipment  back into blue box. Peel off strip of paper to expose adhesive and close box securely. There is a prepaid UPS shipping label on this box. Drop in a UPS drop box, or at a UPS facility like [employer]. You may also contact [employer] to arrange UPS to pick up monitor package at your home.   Follow-Up: At [facility], you and your health needs are our priority.  As part of our continuing mission to provide you with exceptional heart care, we have created designated Provider Care Teams.  These Care Teams include your primary Cardiologist (physician) and Advanced Practice Providers (APPs -  Physician Assistants and Nurse Practitioners) who all work together to provide you with the care you need, when you need it.  Your next appointment:   3 month(s)  The format for your next appointment:   In Person  Provider:   Sherren Mocha, MD  Other Instructions  Decrease your caffeine intake, switch to decaffeinated.      Smoking Tobacco Information, Adult Smoking tobacco can be harmful to your health. Tobacco contains a poisonous (toxic), colorless chemical called nicotine. Nicotine is addictive. It changes the brain and can make it hard to stop smoking. Tobacco  also has other toxic chemicals that can hurt your body and raise your risk of many cancers. How can smoking tobacco affect me? Smoking tobacco puts you at risk for:  Cancer. Smoking is most commonly associated with lung cancer, but can also lead to cancer in other parts of the body.  Chronic obstructive pulmonary disease (COPD). This is a long-term lung condition that makes it hard to breathe. It also gets worse over time.  High blood pressure (hypertension), heart disease, stroke, or heart attack.  Lung infections, such as pneumonia.  Cataracts. This is when the lenses in the eyes become clouded.  Digestive problems. This may include peptic ulcers, heartburn, and gastroesophageal reflux disease (GERD).  Oral health problems, such as  gum disease and tooth loss.  Loss of taste and smell. Smoking can affect your appearance by causing:  Wrinkles.  Yellow or stained teeth, fingers, and fingernails. Smoking tobacco can also affect your social life, because:  It may be challenging to find places to smoke when away from home. Many workplaces, [employer], hotels, and public places are tobacco-free.  Smoking is expensive. This is due to the cost of tobacco and the long-term costs of treating health problems from smoking.  Secondhand smoke may affect those around you. Secondhand smoke can cause lung cancer, breathing problems, and heart disease. Children of smokers have a higher risk for: ? Sudden infant death syndrome (SIDS). ? Ear infections. ? Lung infections. If you currently smoke tobacco, quitting now can help you:  Lead a longer and healthier life.  Look, smell, breathe, and feel better over time.  Save money.  Protect others from the harms of secondhand smoke. What actions can I take to prevent health problems? Quit smoking   Do not start smoking. Quit if you already do.  Make a plan to quit smoking and commit to it. Look for programs to help you and ask your health care provider for recommendations and ideas.  Set a date and write down all the reasons you want to quit.  Let your friends and family know you are quitting so they can help and support you. Consider finding friends who also want to quit. It can be easier to quit with someone else, so that you can support each other.  Talk with your health care provider about using nicotine replacement medicines to help you quit, such as gum, lozenges, patches, sprays, or pills.  Do not replace cigarette smoking with electronic cigarettes, which are commonly called e-cigarettes. The safety of e-cigarettes is not known, and some may contain harmful chemicals.  If you try to quit but return to smoking, stay positive. It is common to slip up when you first quit,  so take it one day at a time.  Be prepared for cravings. When you feel the urge to smoke, chew gum or suck on hard candy. Lifestyle  Stay busy and take care of your body.  Drink enough fluid to keep your urine pale yellow.  Get plenty of exercise and eat a healthy diet. This can help prevent weight gain after quitting.  Monitor your eating habits. Quitting smoking can cause you to have a larger appetite than when you smoke.  Find ways to relax. Go out with friends or family to a movie or a restaurant where people do not smoke.  Ask your health care provider about having regular tests (screenings) to check for cancer. This may include blood tests, imaging tests, and other tests.  Find ways to manage your  stress, such as meditation, yoga, or exercise. Where to find support To get support to quit smoking, consider:  Asking your health care provider for more information and resources.  Taking classes to learn more about quitting smoking.  Looking for local organizations that offer resources about quitting smoking.  Joining a support group for people who want to quit smoking in your local community.  Calling the smokefree.gov counselor helpline: 1-800-Quit-Now 2176055662) Where to find more information You may find more information about quitting smoking from:  HelpGuide.org: www.helpguide.org  https://hall.com/: smokefree.gov  American Lung Association: www.lung.org Contact a health care provider if you:  Have problems breathing.  Notice that your lips, nose, or fingers turn blue.  Have chest pain.  Are coughing up blood.  Feel faint or you pass out.  Have other health changes that cause you to worry. Summary  Smoking tobacco can negatively affect your health, the health of those around you, your finances, and your social life.  Do not start smoking. Quit if you already do. If you need help quitting, ask your health care provider.  Think about joining a support  group for people who want to quit smoking in your local community. There are many effective programs that will help you to quit this behavior. This information is not intended to replace advice given to you by your health care provider. Make sure you discuss any questions you have with your health care provider. Document Revised: 04/14/2019 Document Reviewed: 08/04/2016 Elsevier Patient Education  2020 [employer].

## 2019-08-23 ENCOUNTER — Telehealth (HOSPITAL_COMMUNITY): Payer: Self-pay | Admitting: *Deleted

## 2019-08-23 NOTE — Telephone Encounter (Signed)
Patient given detailed instructions per Myocardial Perfusion Study Information Sheet for the test on 08/25/19 at 7:45. Patient notified to arrive 15 minutes early and that it is imperative to arrive on time for appointment to keep from having the test rescheduled.  If you need to cancel or reschedule your appointment, please call the office within 24 hours of your appointment. . Patient verbalized understanding.Jeffrey Winters

## 2019-08-25 ENCOUNTER — Other Ambulatory Visit: Payer: Self-pay

## 2019-08-25 ENCOUNTER — Ambulatory Visit (HOSPITAL_COMMUNITY): Payer: Self-pay | Attending: Cardiovascular Disease

## 2019-08-25 DIAGNOSIS — R0789 Other chest pain: Secondary | ICD-10-CM

## 2019-08-25 DIAGNOSIS — I25119 Atherosclerotic heart disease of native coronary artery with unspecified angina pectoris: Secondary | ICD-10-CM

## 2019-08-25 LAB — MYOCARDIAL PERFUSION IMAGING
LV dias vol: 137 mL (ref 62–150)
LV sys vol: 54 mL
Peak HR: 96 {beats}/min
Rest HR: 56 {beats}/min
SDS: 0
SRS: 0
SSS: 0
TID: 1.04

## 2019-08-25 MED ORDER — REGADENOSON 0.4 MG/5ML IV SOLN
0.4000 mg | Freq: Once | INTRAVENOUS | Status: AC
  Administered 2019-08-25: 0.4 mg via INTRAVENOUS

## 2019-08-25 MED ORDER — TECHNETIUM TC 99M TETROFOSMIN IV KIT
10.2000 | PACK | Freq: Once | INTRAVENOUS | Status: AC | PRN
  Administered 2019-08-25: 10.2 via INTRAVENOUS
  Filled 2019-08-25: qty 11

## 2019-08-25 MED ORDER — TECHNETIUM TC 99M TETROFOSMIN IV KIT
31.9000 | PACK | Freq: Once | INTRAVENOUS | Status: AC | PRN
  Administered 2019-08-25: 31.9 via INTRAVENOUS
  Filled 2019-08-25: qty 32

## 2019-08-29 ENCOUNTER — Encounter: Payer: Self-pay | Admitting: Cardiovascular Disease

## 2019-08-29 ENCOUNTER — Telehealth: Payer: Self-pay

## 2019-08-29 ENCOUNTER — Other Ambulatory Visit: Payer: Self-pay

## 2019-08-29 ENCOUNTER — Ambulatory Visit (INDEPENDENT_AMBULATORY_CARE_PROVIDER_SITE_OTHER): Payer: Self-pay | Admitting: Cardiovascular Disease

## 2019-08-29 DIAGNOSIS — I739 Peripheral vascular disease, unspecified: Secondary | ICD-10-CM

## 2019-08-29 MED ORDER — ISOSORBIDE MONONITRATE ER 30 MG PO TB24: 15.0000 mg | ORAL_TABLET | Freq: Every day | ORAL | 1 refills | Status: DC

## 2019-08-29 MED FILL — ISOSORBIDE MN ER 30 MG TAB: 30 | 30 days supply | Qty: 15 | Fill #0

## 2019-08-29 NOTE — Assessment & Plan Note (Signed)
Mr. Granillo was referred to me by [name], PA-C for symptomatic PAD.  He does have a history of CAD status post non-STEMI in December treated with PCI and drug-eluting stenting of his distal RCA.  He has true hypertension hyperlipidemia as well as tobacco abuse.  He does complain of bilateral calf claudication at 50 to 75 feet which is symmetric.  He had Doppler studies performed 08/09/2019 revealing normal ABIs bilaterally with moderate SFA disease in both mid SFAs left worse than right.  He wishes to proceed with endovascular therapy for lifestyle limiting claudication.  Because of the current pandemic and Covid census we are going to delay this for 2 months.

## 2019-08-29 NOTE — Telephone Encounter (Signed)
I called the patient 08/29/2019 and reviewed the results of his stress test with him. We will try to advance medical therapy for his coronary artery disease. He is not on any PDE-5 inhibitors. Therefore, I will place him on Isosorbide MN 15 mg once daily. We discussed potential side effects and need for follow up with me or [name] in 4-6 weeks. PLAN:  - Start Isosorbide mononitrate 15 mg once daily (Pharmacy: [facility]) - Schedule follow up appt with [name] or me in 4-6 weeks. - Please send Copy to PCP [name], PA-C   08/29/2019 8:43 AM   I called patient, he will come back on 10/03/19 to follow up with Nicki Reaper, Imdur sent to pharmacy. Copy sent to PCP.

## 2019-08-29 NOTE — Progress Notes (Signed)
08/29/2019 Jeffrey Winters   09-Apr-1966  [id number]  Primary Physician Patient, No Pcp Per Primary Cardiologist: Jeffrey Harp MD Jeffrey Winters, [place], [place]  HPI:  Jeffrey Winters is a 54 y.o. mild to moderately overweight single African-American male with no children referred to me by Jeffrey Dopp, PA-C for treatment of symptomatic PAD.  He does not have a primary care provider.  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  His risk factors include 20 to 30 pack years of tobacco abuse continue to smoke 1/2 pack/day, treated hypertension hyperlipidemia.  There is no family history of heart disease.  He is never had a stroke.  He had a non-STEMI in December and had treatment of his distal RCA.  He had stenting of his distal RCA and obtuse marginal branch at [facility] 02/26/2016 as well.  He does complain of left Jeffrey claudication at 50 to 75 feet which is symmetric bilaterally.  He had Doppler studies performed 08/09/2019 revealing normal ABIs with moderate disease in both SFAs.   Current Meds  Medication Sig  . aspirin 81 MG chewable tablet Chew 1 tablet (81 mg total) by mouth daily.  Marland Kitchen atorvastatin (LIPITOR) 80 MG tablet Take 1 tablet (80 mg total) by mouth daily at 6 PM.  . famotidine (PEPCID) 20 MG tablet Take 1 tablet (20 mg total) by mouth 2 (two) times daily.  . metoprolol succinate (TOPROL XL) 25 MG 24 hr tablet Take 1 tablet by mouth once a day, may take an additional tablet as needed for palpitations  . nitroGLYCERIN (NITROSTAT) 0.4 MG SL tablet Place 1 tablet (0.4 mg total) under the tongue every 5 (five) minutes as needed.  . prasugrel (EFFIENT) 10 MG TABS tablet Take 1 tablet (10 mg total) by mouth daily.     No Known Allergies  Social History   Socioeconomic History  . Marital status: Single    Spouse name: Not on file  . Number of children: Not on file  . Years of education: Not on file  . Highest education level: Not  on file  Occupational History  . Not on file  Tobacco Use  . Smoking status: Current Every Day Smoker  . Smokeless tobacco: Never Used  . Tobacco comment: 4 cigs/day  Substance and Sexual Activity  . Alcohol use: Not Currently  . Drug use: No  . Sexual activity: Not on file  Other Topics Concern  . Not on file  Social History Narrative  . Not on file   Social Determinants of Health   Financial Resource Strain:   . Difficulty of Paying Living Expenses: Not on file  Food Insecurity:   . Worried About Charity fundraiser in the Last Year: Not on file  . Ran Out of Food in the Last Year: Not on file  Transportation Needs:   . Lack of Transportation (Medical): Not on file  . Lack of Transportation (Non-Medical): Not on file  Physical Activity:   . Days of Exercise per Week: Not on file  . Minutes of Exercise per Session: Not on file  Stress:   . Feeling of Stress : Not on file  Social Connections:   . Frequency of Communication with Friends and Family: Not on file  . Frequency of Social Gatherings with Friends and Family: Not on file  . Attends Religious Services: Not on file  . Active Member of Clubs or Organizations: Not on file  .  Attends Archivist Meetings: Not on file  . Marital Status: Not on file  Intimate Partner Violence:   . Fear of Current or Ex-Partner: Not on file  . Emotionally Abused: Not on file  . Physically Abused: Not on file  . Sexually Abused: Not on file     Review of Systems: General: negative for chills, fever, night sweats or weight changes.  Cardiovascular: negative for chest pain, dyspnea on exertion, edema, orthopnea, palpitations, paroxysmal nocturnal dyspnea or shortness of breath Dermatological: negative for rash Respiratory: negative for cough or wheezing Urologic: negative for hematuria Abdominal: negative for nausea, vomiting, diarrhea, bright red blood per rectum, melena, or hematemesis Neurologic: negative for visual  changes, syncope, or dizziness All other systems reviewed and are otherwise negative except as noted above.    Blood pressure 127/67, pulse 75, temperature (!) 96.3 F (35.7 C), height 5\' 4"  (1.626 m), weight 220 lb 9.6 oz (100.1 kg), SpO2 98 %.  General appearance: alert and no distress Neck: no adenopathy, no carotid bruit, no JVD, supple, symmetrical, trachea midline and thyroid not enlarged, symmetric, no tenderness/mass/nodules Lungs: clear to auscultation bilaterally Heart: regular rate and rhythm, S1, S2 normal, no murmur, click, rub or gallop Extremities: extremities normal, atraumatic, no cyanosis or edema Pulses: 2+ and symmetric Skin: Skin color, texture, turgor normal. No rashes or lesions Neurologic: Alert and oriented X 3, normal strength and tone. Normal symmetric reflexes. Normal coordination and gait  EKG not performed today  ASSESSMENT AND PLAN:   PAD (peripheral artery disease) [facility]) Mr. Winters was referred to me by Jeffrey Dopp, PA-C for symptomatic PAD.  He does have a history of CAD status post non-STEMI in December treated with PCI and drug-eluting stenting of his distal RCA.  He has true hypertension hyperlipidemia as well as tobacco abuse.  He does complain of bilateral calf claudication at 50 to 75 feet which is symmetric.  He had Doppler studies performed 08/09/2019 revealing normal ABIs bilaterally with moderate SFA disease in both mid SFAs left worse than right.  He wishes to proceed with endovascular therapy for lifestyle limiting claudication.  Because of the current pandemic and Covid census we are going to delay this for 2 months.      Jeffrey Harp MD FACP,FACC,FAHA, [facility] 08/29/2019 8:23 AM

## 2019-08-29 NOTE — Patient Instructions (Signed)
Medication Instructions:  Your physician recommends that you continue on your current medications as directed. Please refer to the Current Medication list given to you today.  If you need a refill on your cardiac medications before your next appointment, please call your pharmacy.   Lab work: NONE  Testing/Procedures: NONE  Follow-Up: At [facility], you and your health needs are our priority.  As part of our continuing mission to provide you with exceptional heart care, we have created designated Provider Care Teams.  These Care Teams include your primary Cardiologist (physician) and Advanced Practice Providers (APPs -  Physician Assistants and Nurse Practitioners) who all work together to provide you with the care you need, when you need it. You may see Dr. Gwenlyn Found or one of the following Advanced Practice Providers on your designated Care Team:    Kerin Ransom, PA-C  [place], [place]  Coletta Memos, [place]  Your physician wants you to follow-up in: 2 months with Dr. Gwenlyn Found to discuss possible angiogram

## 2019-09-06 ENCOUNTER — Other Ambulatory Visit: Payer: Self-pay

## 2019-09-06 ENCOUNTER — Telehealth: Payer: Self-pay

## 2019-09-06 DIAGNOSIS — I739 Peripheral vascular disease, unspecified: Secondary | ICD-10-CM

## 2019-09-06 DIAGNOSIS — I25119 Atherosclerotic heart disease of native coronary artery with unspecified angina pectoris: Secondary | ICD-10-CM

## 2019-09-06 MED ORDER — SODIUM CHLORIDE 0.9% FLUSH: 3.0000 mL | Freq: Two times a day (BID) | INTRAVENOUS | Status: DC

## 2019-09-06 NOTE — Telephone Encounter (Signed)
Called pt and scheduled PV Angio with Dr. Gwenlyn Found on 2/25 @ 0930 am. Pt is aware they need to arrive at 0730. Went over instructions with patient. Put in covid test for 2/22 @ 12:30pm and pre-lab orders. Pt verbalized understanding. Put in orders for f/u dopplers. Messaged schedulers to make f/u appts. Mailed instructions with lab slips. Dr. Gwenlyn Found aware pt needs up-to-date H&P.

## 2019-09-11 ENCOUNTER — Telehealth: Payer: Self-pay | Admitting: Cardiovascular Disease

## 2019-09-11 MED FILL — PRASUGREL 10 MG TABLET: 10 | 30 days supply | Qty: 30 | Fill #1

## 2019-09-11 NOTE — Telephone Encounter (Signed)
Spoke with patient to schedule post angiogram dopplers---he states he received a call stating the date of his procedure was going to be changed to 09/25/19.  Please advise

## 2019-09-11 NOTE — Telephone Encounter (Signed)
Called pt - aware of procedure on 2.25 with Dr. Gwenlyn Found - aware of covid test and pre-labs on 2/22 - verbalized understanding. Verbalized understanding that he needs dopplers 1 week after procedure. Will send to scheduling to make appt.

## 2019-09-12 ENCOUNTER — Ambulatory Visit: Payer: Self-pay | Admitting: Internal Medicine

## 2019-09-12 ENCOUNTER — Telehealth: Payer: Self-pay

## 2019-09-12 NOTE — Telephone Encounter (Signed)

## 2019-09-13 ENCOUNTER — Ambulatory Visit (INDEPENDENT_AMBULATORY_CARE_PROVIDER_SITE_OTHER): Payer: Self-pay | Admitting: Internal Medicine

## 2019-09-13 ENCOUNTER — Encounter: Payer: Self-pay | Admitting: Internal Medicine

## 2019-09-13 ENCOUNTER — Other Ambulatory Visit: Payer: Self-pay

## 2019-09-13 VITALS — BP 123/73 | HR 70 | Temp 97.2°F | Resp 17 | Ht 69.0 in | Wt 221.0 lb

## 2019-09-13 DIAGNOSIS — Z1211 Encounter for screening for malignant neoplasm of colon: Secondary | ICD-10-CM

## 2019-09-13 DIAGNOSIS — K219 Gastro-esophageal reflux disease without esophagitis: Secondary | ICD-10-CM

## 2019-09-13 NOTE — Progress Notes (Signed)
  Subjective:    Jeffrey Winters - 54 y.o. male MRN [id number]  Date of birth: 1966-01-28  HPI  Garrey Siglin is here for follow up of GERD. Patient has been taking Pepcid 20 mg daily or sometimes every other day. No further heartburn symptoms. Doing well.      Health Maintenance:  Health Maintenance Due  Topic Date Due  . HIV Screening  08/18/1980  . COLON CANCER SCREENING ANNUAL FOBT  08/19/2015    -  reports that he has been smoking. He has never used smokeless tobacco. - Review of Systems: Per HPI. - Past Medical History: Patient Active Problem List   Diagnosis Date Noted  . PAD (peripheral artery disease) ([place])   . Hyperlipidemia 07/12/2019  . STEMI (ST elevation myocardial infarction) ([place]) 07/10/2019  . STEMI involving right coronary artery ([place]) 07/10/2019   - Medications: reviewed and updated   Objective:   Physical Exam BP 123/73   Pulse 70   Temp (!) 97.2 F (36.2 C) (Temporal)   Resp 17   Ht 5\' 9"  (1.753 m)   Wt 221 lb (100.2 kg)   SpO2 96%   BMI 32.64 kg/m  Physical Exam  Constitutional: He is oriented to person, place, and time and well-developed, well-nourished, and in no distress. No distress.  HENT:  Head: Normocephalic and atraumatic.  Eyes: Conjunctivae and EOM are normal.  Cardiovascular: Normal rate.  Pulmonary/Chest: Effort normal. No respiratory distress.  Musculoskeletal:        General: Normal range of motion.  Neurological: He is alert and oriented to person, place, and time.  Skin: Skin is warm and dry. He is not diaphoretic.  Psychiatric: Affect and judgment normal.           Assessment & Plan:   1. Gastroesophageal reflux disease without esophagitis Continue Pepcid. Discussed can use prn if he would like but that is typically safe medication for daily use.   2. Colon cancer screening - Fecal occult blood, imunochemical([facility]); Future     Phill Myron, D.O. 09/13/2019, 2:43 PM Primary Care at [facility]

## 2019-09-14 LAB — BASIC METABOLIC PANEL
BUN/Creatinine Ratio: 11 (ref 9–20)
BUN: 12 mg/dL (ref 6–24)
CO2: 26 mmol/L (ref 20–29)
Calcium: 9.3 mg/dL (ref 8.7–10.2)
Chloride: 100 mmol/L (ref 96–106)
Creatinine, Ser: 1.05 mg/dL (ref 0.76–1.27)
GFR calc Af Amer: 93 mL/min/{1.73_m2} (ref >59)
GFR calc non Af Amer: 80 mL/min/{1.73_m2} (ref >59)
Glucose: 130 mg/dL — ABNORMAL HIGH (ref 65–99)
Potassium: 4.6 mmol/L (ref 3.5–5.2)
Sodium: 140 mmol/L (ref 134–144)

## 2019-09-14 LAB — CBC
Hematocrit: 42.7 % (ref 37.5–51.0)
Hemoglobin: 14.4 g/dL (ref 13.0–17.7)
MCH: 27.3 pg (ref 26.6–33.0)
MCHC: 33.7 g/dL (ref 31.5–35.7)
MCV: 81 fL (ref 79–97)
Platelets: 210 10*3/uL (ref 150–450)
RBC: 5.27 x10E6/uL (ref 4.14–5.80)
RDW: 13.2 % (ref 11.6–15.4)
WBC: 5 10*3/uL (ref 3.4–10.8)

## 2019-09-18 ENCOUNTER — Other Ambulatory Visit: Payer: Self-pay | Admitting: Nurse Practitioner

## 2019-09-18 ENCOUNTER — Telehealth: Payer: Self-pay

## 2019-09-18 NOTE — Telephone Encounter (Signed)
Patient calling to reschedule his covid test 2/22.

## 2019-09-18 NOTE — Telephone Encounter (Signed)
Spoke with patient and he needs to reschedule is procedure with Dr Gwenlyn Found, unable to have COVID test as scheduled. Will forward to [facility] RN to reschedule

## 2019-09-19 LAB — FECAL OCCULT BLOOD, IMMUNOCHEMICAL: Fecal Occult Bld: NEGATIVE

## 2019-09-19 LAB — SPECIMEN STATUS REPORT

## 2019-09-19 NOTE — Telephone Encounter (Signed)
Pt called needing to switch PV procedure to March 4th d/t personal reasons. Made new covid test. Will message scheduling to reschedule LEAs. Cath lab aware.

## 2019-09-22 NOTE — Progress Notes (Signed)
Normal lab letter mailed.

## 2019-09-25 ENCOUNTER — Inpatient Hospital Stay (HOSPITAL_COMMUNITY): Admission: RE | Admit: 2019-09-25 | Payer: Self-pay | Source: Ambulatory Visit

## 2019-09-29 MED FILL — PRASUGREL 10 MG TABLET: 10 | 30 days supply | Qty: 30 | Fill #1

## 2019-09-29 MED FILL — ISOSORBIDE MN ER 30 MG TAB: 30 | 30 days supply | Qty: 15 | Fill #1

## 2019-10-02 ENCOUNTER — Other Ambulatory Visit (HOSPITAL_COMMUNITY)
Admission: RE | Admit: 2019-10-02 | Discharge: 2019-10-02 | Disposition: A | Discharge status: Home / Self Care | Payer: HRSA Program | Source: Ambulatory Visit | Attending: Cardiovascular Disease | Admitting: Cardiovascular Disease

## 2019-10-02 ENCOUNTER — Other Ambulatory Visit (HOSPITAL_COMMUNITY): Payer: Self-pay

## 2019-10-02 DIAGNOSIS — Z01812 Encounter for preprocedural laboratory examination: Secondary | ICD-10-CM | POA: Diagnosis present

## 2019-10-02 DIAGNOSIS — Z20822 Contact with and (suspected) exposure to covid-19: Secondary | ICD-10-CM | POA: Insufficient documentation

## 2019-10-02 LAB — SARS CORONAVIRUS 2 (TAT 6-24 HRS): SARS Coronavirus 2: NEGATIVE

## 2019-10-02 MED FILL — METOPROLOL SUCCINATE ER 25: 25 | 30 days supply | Qty: 60 | Fill #1

## 2019-10-03 ENCOUNTER — Telehealth: Payer: Self-pay | Admitting: *Deleted

## 2019-10-03 ENCOUNTER — Ambulatory Visit: Payer: Self-pay | Admitting: Physician Assistant

## 2019-10-03 ENCOUNTER — Encounter: Payer: Self-pay | Admitting: Cardiovascular Disease

## 2019-10-03 ENCOUNTER — Telehealth: Payer: Self-pay | Admitting: Cardiovascular Disease

## 2019-10-03 ENCOUNTER — Telehealth: Payer: Self-pay | Admitting: Internal Medicine

## 2019-10-03 ENCOUNTER — Ambulatory Visit (INDEPENDENT_AMBULATORY_CARE_PROVIDER_SITE_OTHER): Payer: Self-pay | Admitting: Cardiovascular Disease

## 2019-10-03 ENCOUNTER — Other Ambulatory Visit: Payer: Self-pay

## 2019-10-03 DIAGNOSIS — I739 Peripheral vascular disease, unspecified: Secondary | ICD-10-CM

## 2019-10-03 NOTE — Progress Notes (Signed)
10/03/2019 Jeffrey Winters   1966-06-03  [id number]  Primary Physician Liliane Shi, PA-C Primary Cardiologist: Lorretta Harp MD FACP, [place], [place], [place]  HPI:  Jeffrey Winters is a 54 y.o. mild to moderately overweight single African-American male with no children referred to me by [name], PA-C for treatment of symptomatic PAD.  He does not have a primary care provider.  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  I last saw him in the office 08/29/2019. His risk factors include 20 to 30 pack years of tobacco abuse continue to smoke 1/2 pack/day, treated hypertension hyperlipidemia.  There is no family history of heart disease.  He is never had a stroke.  He had a non-STEMI in December and had treatment of his distal RCA.  He had stenting of his distal RCA and obtuse marginal branch at [facility] 02/26/2016 as well.  He does complain of left Solomon claudication at 50 to 75 feet which is symmetric bilaterally.  He had Doppler studies performed 08/09/2019 revealing normal ABIs with moderate disease in both SFAs.  Since I saw him 6 weeks ago he continues to have claudication.  He wishes to proceed with angiography potential endovascular therapy for lifestyle limiting claudication.  He scheduled for this Thursday.   Current Meds  Medication Sig  . aspirin 81 MG chewable tablet Chew 1 tablet (81 mg total) by mouth daily.  Marland Kitchen atorvastatin (LIPITOR) 80 MG tablet Take 1 tablet (80 mg total) by mouth daily at 6 PM. (Patient taking differently: Take 80 mg by mouth daily. )  . famotidine (PEPCID) 20 MG tablet Take 1 tablet (20 mg total) by mouth 2 (two) times daily.  . isosorbide mononitrate (IMDUR) 30 MG 24 hr tablet Take 0.5 tablets (15 mg total) by mouth daily.  . metoprolol succinate (TOPROL XL) 25 MG 24 hr tablet Take 1 tablet by mouth once a day, may take an additional tablet as needed for palpitations (Patient taking differently: Take 25 mg  by mouth daily. may take an additional 25 mg as needed for palpitations)  . nitroGLYCERIN (NITROSTAT) 0.4 MG SL tablet Place 1 tablet (0.4 mg total) under the tongue every 5 (five) minutes as needed.  . prasugrel (EFFIENT) 10 MG TABS tablet Take 1 tablet (10 mg total) by mouth daily.     No Known Allergies  Social History   Socioeconomic History  . Marital status: Single    Spouse name: Not on file  . Number of children: Not on file  . Years of education: Not on file  . Highest education level: Not on file  Occupational History  . Not on file  Tobacco Use  . Smoking status: Current Every Day Smoker  . Smokeless tobacco: Never Used  . Tobacco comment: 4 cigs/day  Substance and Sexual Activity  . Alcohol use: Not Currently  . Drug use: No  . Sexual activity: Not on file  Other Topics Concern  . Not on file  Social History Narrative  . Not on file   Social Determinants of Health   Financial Resource Strain:   . Difficulty of Paying Living Expenses: Not on file  Food Insecurity:   . Worried About Charity fundraiser in the Last Year: Not on file  . Ran Out of Food in the Last Year: Not on file  Transportation Needs:   . Lack of Transportation (Medical): Not on file  . Lack of  Transportation (Non-Medical): Not on file  Physical Activity:   . Days of Exercise per Week: Not on file  . Minutes of Exercise per Session: Not on file  Stress:   . Feeling of Stress : Not on file  Social Connections:   . Frequency of Communication with Friends and Family: Not on file  . Frequency of Social Gatherings with Friends and Family: Not on file  . Attends Religious Services: Not on file  . Active Member of Clubs or Organizations: Not on file  . Attends Archivist Meetings: Not on file  . Marital Status: Not on file  Intimate Partner Violence:   . Fear of Current or Ex-Partner: Not on file  . Emotionally Abused: Not on file  . Physically Abused: Not on file  . Sexually  Abused: Not on file     Review of Systems: General: negative for chills, fever, night sweats or weight changes.  Cardiovascular: negative for chest pain, dyspnea on exertion, edema, orthopnea, palpitations, paroxysmal nocturnal dyspnea or shortness of breath Dermatological: negative for rash Respiratory: negative for cough or wheezing Urologic: negative for hematuria Abdominal: negative for nausea, vomiting, diarrhea, bright red blood per rectum, melena, or hematemesis Neurologic: negative for visual changes, syncope, or dizziness All other systems reviewed and are otherwise negative except as noted above.    Blood pressure 130/60, pulse 72, temperature 98.2 F (36.8 C), height 5\' 7"  (1.702 m), weight 225 lb (102.1 kg).  General appearance: alert and no distress Neck: no adenopathy, no carotid bruit, no JVD, supple, symmetrical, trachea midline and thyroid not enlarged, symmetric, no tenderness/mass/nodules Lungs: clear to auscultation bilaterally Heart: regular rate and rhythm, S1, S2 normal, no murmur, click, rub or gallop Extremities: extremities normal, atraumatic, no cyanosis or edema Pulses: 2+ and symmetric Skin: Skin color, texture, turgor normal. No rashes or lesions Neurologic: Alert and oriented X 3, normal strength and tone. Normal symmetric reflexes. Normal coordination and gait  EKG not performed today  ASSESSMENT AND PLAN:   PAD (peripheral artery disease) ([place]) For follow-up prior to his scheduled PV angiogram this coming Thursday.  He does have a history of ischemic heart disease.  He has less limiting claudication which is fairly symmetric and Doppler studies performed 08/09/2019 revealing fairly normal ABIs with bilateral SFA disease.  His symptoms are lifestyle limiting.  We will plan on performing angiography and endovascular therapy.      Lorretta Harp MD FACP,FACC,FAHA, [facility] 10/03/2019 1:44 PM

## 2019-10-03 NOTE — Assessment & Plan Note (Signed)
For follow-up prior to his scheduled PV angiogram this coming Thursday.  He does have a history of ischemic heart disease.  He has less limiting claudication which is fairly symmetric and Doppler studies performed 08/09/2019 revealing fairly normal ABIs with bilateral SFA disease.  His symptoms are lifestyle limiting.  We will plan on performing angiography and endovascular therapy.

## 2019-10-03 NOTE — H&P (View-Only) (Signed)
10/03/2019 Jeffrey Winters   03/04/1966  [id number]  Primary Physician Liliane Shi, PA-C Primary Cardiologist: Lorretta Harp MD FACP, [place], [place], [place]  HPI:  Jeffrey Winters is a 54 y.o. mild to moderately overweight single African-American male with no children referred to me by [name], PA-C for treatment of symptomatic PAD.  He does not have a primary care provider.  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  I last saw him in the office 08/29/2019. His risk factors include 20 to 30 pack years of tobacco abuse continue to smoke 1/2 pack/day, treated hypertension hyperlipidemia.  There is no family history of heart disease.  He is never had a stroke.  He had a non-STEMI in December and had treatment of his distal RCA.  He had stenting of his distal RCA and obtuse marginal branch at [facility] 02/26/2016 as well.  He does complain of left Solomon claudication at 50 to 75 feet which is symmetric bilaterally.  He had Doppler studies performed 08/09/2019 revealing normal ABIs with moderate disease in both SFAs.  Since I saw him 6 weeks ago he continues to have claudication.  He wishes to proceed with angiography potential endovascular therapy for lifestyle limiting claudication.  He scheduled for this Thursday.   Current Meds  Medication Sig  . aspirin 81 MG chewable tablet Chew 1 tablet (81 mg total) by mouth daily.  Marland Kitchen atorvastatin (LIPITOR) 80 MG tablet Take 1 tablet (80 mg total) by mouth daily at 6 PM. (Patient taking differently: Take 80 mg by mouth daily. )  . famotidine (PEPCID) 20 MG tablet Take 1 tablet (20 mg total) by mouth 2 (two) times daily.  . isosorbide mononitrate (IMDUR) 30 MG 24 hr tablet Take 0.5 tablets (15 mg total) by mouth daily.  . metoprolol succinate (TOPROL XL) 25 MG 24 hr tablet Take 1 tablet by mouth once a day, may take an additional tablet as needed for palpitations (Patient taking differently: Take 25 mg  by mouth daily. may take an additional 25 mg as needed for palpitations)  . nitroGLYCERIN (NITROSTAT) 0.4 MG SL tablet Place 1 tablet (0.4 mg total) under the tongue every 5 (five) minutes as needed.  . prasugrel (EFFIENT) 10 MG TABS tablet Take 1 tablet (10 mg total) by mouth daily.     No Known Allergies  Social History   Socioeconomic History  . Marital status: Single    Spouse name: Not on file  . Number of children: Not on file  . Years of education: Not on file  . Highest education level: Not on file  Occupational History  . Not on file  Tobacco Use  . Smoking status: Current Every Day Smoker  . Smokeless tobacco: Never Used  . Tobacco comment: 4 cigs/day  Substance and Sexual Activity  . Alcohol use: Not Currently  . Drug use: No  . Sexual activity: Not on file  Other Topics Concern  . Not on file  Social History Narrative  . Not on file   Social Determinants of Health   Financial Resource Strain:   . Difficulty of Paying Living Expenses: Not on file  Food Insecurity:   . Worried About Charity fundraiser in the Last Year: Not on file  . Ran Out of Food in the Last Year: Not on file  Transportation Needs:   . Lack of Transportation (Medical): Not on file  . Lack of  Transportation (Non-Medical): Not on file  Physical Activity:   . Days of Exercise per Week: Not on file  . Minutes of Exercise per Session: Not on file  Stress:   . Feeling of Stress : Not on file  Social Connections:   . Frequency of Communication with Friends and Family: Not on file  . Frequency of Social Gatherings with Friends and Family: Not on file  . Attends Religious Services: Not on file  . Active Member of Clubs or Organizations: Not on file  . Attends Archivist Meetings: Not on file  . Marital Status: Not on file  Intimate Partner Violence:   . Fear of Current or Ex-Partner: Not on file  . Emotionally Abused: Not on file  . Physically Abused: Not on file  . Sexually  Abused: Not on file     Review of Systems: General: negative for chills, fever, night sweats or weight changes.  Cardiovascular: negative for chest pain, dyspnea on exertion, edema, orthopnea, palpitations, paroxysmal nocturnal dyspnea or shortness of breath Dermatological: negative for rash Respiratory: negative for cough or wheezing Urologic: negative for hematuria Abdominal: negative for nausea, vomiting, diarrhea, bright red blood per rectum, melena, or hematemesis Neurologic: negative for visual changes, syncope, or dizziness All other systems reviewed and are otherwise negative except as noted above.    Blood pressure 130/60, pulse 72, temperature 98.2 F (36.8 C), height 5\' 7"  (1.702 m), weight 225 lb (102.1 kg).  General appearance: alert and no distress Neck: no adenopathy, no carotid bruit, no JVD, supple, symmetrical, trachea midline and thyroid not enlarged, symmetric, no tenderness/mass/nodules Lungs: clear to auscultation bilaterally Heart: regular rate and rhythm, S1, S2 normal, no murmur, click, rub or gallop Extremities: extremities normal, atraumatic, no cyanosis or edema Pulses: 2+ and symmetric Skin: Skin color, texture, turgor normal. No rashes or lesions Neurologic: Alert and oriented X 3, normal strength and tone. Normal symmetric reflexes. Normal coordination and gait  EKG not performed today  ASSESSMENT AND PLAN:   PAD (peripheral artery disease) ([place]) For follow-up prior to his scheduled PV angiogram this coming Thursday.  He does have a history of ischemic heart disease.  He has less limiting claudication which is fairly symmetric and Doppler studies performed 08/09/2019 revealing fairly normal ABIs with bilateral SFA disease.  His symptoms are lifestyle limiting.  We will plan on performing angiography and endovascular therapy.      Lorretta Harp MD FACP,FACC,FAHA, [facility] 10/03/2019 1:44 PM

## 2019-10-03 NOTE — Telephone Encounter (Signed)
Pt contacted pre-catheterization scheduled at [facility] for: Thursday October 05, 2019 7:30 AM Verified arrival time and place: [place] [facility]) at: 5:30 AM   No solid food after midnight prior to cath, clear liquids until 5 AM day of procedure. Contrast allergy: no   AM meds can be  taken pre-cath with sip of water including: ASA 81 mg Effient 10 mg  Confirmed patient has responsible adult to drive home post procedure and observe 24 hours after arriving home:  yes  Currently, due to Covid-19 pandemic, only one person will be allowed with patient. Must be the same person for patient's entire stay and will be required to wear a mask. They will be asked to wait in the waiting room for the duration of the patient's stay.  Patients are required to wear a mask when they enter the hospital.      COVID-19 Pre-Screening Questions:  . In the past 7 to 10 days have you had a cough,  shortness of breath, headache, congestion, fever (100 or greater) body aches, chills, sore throat, or sudden loss of taste or sense of smell? no . Have you been around anyone with known Covid 19 in the past 7-10 days? no . Have you been around anyone who is awaiting Covid 19 test results in the past 7 to 10 days? no . Have you been around anyone who has been exposed to Covid 19, or has mentioned symptoms of Covid 19 within the past 7 to 10 days? no  I reviewed procedure/mask/visitor instructions, COVID-19 screening questions with patient, he verbalized understanding, thanked me for call.

## 2019-10-03 NOTE — Patient Instructions (Addendum)
Medication Instructions:  Your physician recommends that you continue on your current medications as directed. Please refer to the Current Medication list given to you today.  If you need a refill on your cardiac medications before your next appointment, please call your pharmacy.   Lab work: NONE  Testing/Procedures: Your physician has requested that you have a peripheral vascular angiogram on 10/05/2019. This exam is performed at the hospital. During this exam IV contrast is used to look at arterial blood flow. Please review the information sheet given for details.  Follow-Up: At [facility], you and your health needs are our priority.  As part of our continuing mission to provide you with exceptional heart care, we have created designated Provider Care Teams.  These Care Teams include your primary Cardiologist (physician) and Advanced Practice Providers (APPs -  Physician Assistants and Nurse Practitioners) who all work together to provide you with the care you need, when you need it. You may see Dr. Gwenlyn Found or one of the following Advanced Practice Providers on your designated Care Team:    Kerin Ransom, PA-C  [place], [place]  Coletta Memos, [place]    You are scheduled for a Peripheral Angiogram on Thursday, March 4 with Dr. Quay Burow.  1. Please arrive at the [facility] (Main Entrance A) at [facility]: [address] [place], [place] [address] at 5:30 AM (This time is two hours before your procedure to ensure your preparation). Free valet parking service is available.   Special note: Every effort is made to have your procedure done on time. Please understand that emergencies sometimes delay scheduled procedures.  2. Diet: Do not eat solid foods after midnight.  The patient may have clear liquids until 5am upon the day of the procedure.  3. Labs: You already had blood work done  4. Medication instructions in preparation for your procedure:   Contrast Allergy:  No   On the morning of your procedure, take your Aspirin and any morning medicines NOT listed above.  You may use sips of water.  5. Plan for one night stay--bring personal belongings. 6. Bring a current list of your medications and current insurance cards. 7. You MUST have a responsible person to drive you home. 8. Someone MUST be with you the first 24 hours after you arrive home or your discharge will be delayed. 9. Please wear clothes that are easy to get on and off and wear slip-on shoes.  Thank you for allowing [place] to care for you!   -- [facility]

## 2019-10-05 ENCOUNTER — Ambulatory Visit (HOSPITAL_COMMUNITY): Payer: Self-pay

## 2019-10-05 ENCOUNTER — Ambulatory Visit (HOSPITAL_COMMUNITY)
Admission: RE | Admit: 2019-10-05 | Discharge: 2019-10-05 | Disposition: A | Discharge status: Home / Self Care | Payer: Self-pay | Attending: Cardiovascular Disease | Admitting: Cardiovascular Disease

## 2019-10-05 ENCOUNTER — Encounter (HOSPITAL_COMMUNITY)
Admission: RE | Disposition: A | Discharge status: Home / Self Care | Payer: Self-pay | Source: Home / Self Care | Attending: Cardiovascular Disease

## 2019-10-05 ENCOUNTER — Other Ambulatory Visit: Payer: Self-pay

## 2019-10-05 DIAGNOSIS — Z6833 Body mass index (BMI) 33.0-33.9, adult: Secondary | ICD-10-CM | POA: Insufficient documentation

## 2019-10-05 DIAGNOSIS — I70213 Atherosclerosis of native arteries of extremities with intermittent claudication, bilateral legs: Secondary | ICD-10-CM | POA: Insufficient documentation

## 2019-10-05 DIAGNOSIS — I739 Peripheral vascular disease, unspecified: Secondary | ICD-10-CM | POA: Diagnosis present

## 2019-10-05 DIAGNOSIS — F17200 Nicotine dependence, unspecified, uncomplicated: Secondary | ICD-10-CM | POA: Insufficient documentation

## 2019-10-05 DIAGNOSIS — Z7982 Long term (current) use of aspirin: Secondary | ICD-10-CM | POA: Insufficient documentation

## 2019-10-05 DIAGNOSIS — Z79899 Other long term (current) drug therapy: Secondary | ICD-10-CM | POA: Insufficient documentation

## 2019-10-05 DIAGNOSIS — I70212 Atherosclerosis of native arteries of extremities with intermittent claudication, left leg: Secondary | ICD-10-CM

## 2019-10-05 DIAGNOSIS — E663 Overweight: Secondary | ICD-10-CM | POA: Insufficient documentation

## 2019-10-05 DIAGNOSIS — I252 Old myocardial infarction: Secondary | ICD-10-CM | POA: Insufficient documentation

## 2019-10-05 HISTORY — PX: PERIPHERAL VASCULAR ATHERECTOMY: CATH118256

## 2019-10-05 HISTORY — PX: PERIPHERAL VASCULAR INTERVENTION: CATH118257

## 2019-10-05 HISTORY — PX: ABDOMINAL AORTOGRAM W/LOWER EXTREMITY: CATH118223

## 2019-10-05 LAB — POCT ACTIVATED CLOTTING TIME: Activated Clotting Time: 268 seconds

## 2019-10-05 SURGERY — ABDOMINAL AORTOGRAM W/LOWER EXTREMITY
Anesthesia: LOCAL | Laterality: Left

## 2019-10-05 MED ORDER — ASPIRIN 81 MG PO CHEW: 81.0000 mg | CHEWABLE_TABLET | Freq: Every day | ORAL | Status: DC

## 2019-10-05 MED ORDER — SODIUM CHLORIDE 0.9 % IV SOLN: 250.0000 mL | INTRAVENOUS | Status: DC | PRN

## 2019-10-05 MED ORDER — FAMOTIDINE 20 MG PO TABS: 20.0000 mg | ORAL_TABLET | Freq: Two times a day (BID) | ORAL | Status: DC

## 2019-10-05 MED ORDER — NITROGLYCERIN 1 MG/10 ML FOR IR/CATH LAB
INTRA_ARTERIAL | Status: AC
  Filled 2019-10-05: qty 10

## 2019-10-05 MED ORDER — HEPARIN (PORCINE) IN NACL 1000-0.9 UT/500ML-% IV SOLN
INTRAVENOUS | Status: AC
  Filled 2019-10-05: qty 500

## 2019-10-05 MED ORDER — HEPARIN SODIUM (PORCINE) 1000 UNIT/ML IJ SOLN
INTRAMUSCULAR | Status: AC
  Filled 2019-10-05: qty 1

## 2019-10-05 MED ORDER — NITROGLYCERIN 0.4 MG SL SUBL: 0.4000 mg | SUBLINGUAL_TABLET | SUBLINGUAL | Status: DC | PRN

## 2019-10-05 MED ORDER — LIDOCAINE HCL (PF) 1 % IJ SOLN
INTRAMUSCULAR | Status: AC
  Filled 2019-10-05: qty 30

## 2019-10-05 MED ORDER — HEPARIN SODIUM (PORCINE) 1000 UNIT/ML IJ SOLN
INTRAMUSCULAR | Status: DC | PRN
  Administered 2019-10-05: 10000 [IU] via INTRAVENOUS
  Administered 2019-10-05: 3000 [IU] via INTRAVENOUS

## 2019-10-05 MED ORDER — HYDRALAZINE HCL 20 MG/ML IJ SOLN: 5.0000 mg | INTRAMUSCULAR | Status: DC | PRN

## 2019-10-05 MED ORDER — ACETAMINOPHEN 325 MG PO TABS: 650.0000 mg | ORAL_TABLET | ORAL | Status: DC | PRN

## 2019-10-05 MED ORDER — LIDOCAINE HCL (PF) 1 % IJ SOLN
INTRAMUSCULAR | Status: DC | PRN
  Administered 2019-10-05: 15 mL via INTRADERMAL

## 2019-10-05 MED ORDER — SODIUM CHLORIDE 0.9% FLUSH: 3.0000 mL | INTRAVENOUS | Status: DC | PRN

## 2019-10-05 MED ORDER — ASPIRIN EC 81 MG PO TBEC: 81.0000 mg | DELAYED_RELEASE_TABLET | Freq: Every day | ORAL | Status: DC

## 2019-10-05 MED ORDER — ISOSORBIDE MONONITRATE ER 30 MG PO TB24: 15.0000 mg | ORAL_TABLET | Freq: Every day | ORAL | Status: DC

## 2019-10-05 MED ORDER — HEPARIN (PORCINE) IN NACL 1000-0.9 UT/500ML-% IV SOLN
INTRAVENOUS | Status: DC | PRN
  Administered 2019-10-05 (×2): 500 mL

## 2019-10-05 MED ORDER — SODIUM CHLORIDE 0.9 % WEIGHT BASED INFUSION
3.0000 mL/kg/h | INTRAVENOUS | Status: AC
  Administered 2019-10-05: 3 mL/kg/h via INTRAVENOUS

## 2019-10-05 MED ORDER — ASPIRIN 81 MG PO CHEW: 81.0000 mg | CHEWABLE_TABLET | ORAL | Status: DC

## 2019-10-05 MED ORDER — SODIUM CHLORIDE 0.9 % WEIGHT BASED INFUSION: 1.0000 mL/kg/h | INTRAVENOUS | Status: DC

## 2019-10-05 MED ORDER — MORPHINE SULFATE (PF) 2 MG/ML IV SOLN: 2.0000 mg | INTRAVENOUS | Status: DC | PRN

## 2019-10-05 MED ORDER — PRASUGREL HCL 10 MG PO TABS: 10.0000 mg | ORAL_TABLET | Freq: Every day | ORAL | Status: DC

## 2019-10-05 MED ORDER — IODIXANOL 320 MG/ML IV SOLN
INTRAVENOUS | Status: DC | PRN
  Administered 2019-10-05: 195 mL via INTRA_ARTERIAL

## 2019-10-05 MED ORDER — ATORVASTATIN CALCIUM 80 MG PO TABS: 80.0000 mg | ORAL_TABLET | Freq: Every day | ORAL | Status: DC

## 2019-10-05 MED ORDER — SODIUM CHLORIDE 0.9 % IV SOLN: INTRAVENOUS | Status: DC

## 2019-10-05 MED ORDER — SODIUM CHLORIDE 0.9% FLUSH: 3.0000 mL | Freq: Two times a day (BID) | INTRAVENOUS | Status: DC

## 2019-10-05 MED ORDER — METOPROLOL SUCCINATE ER 25 MG PO TB24: 25.0000 mg | ORAL_TABLET | Freq: Every day | ORAL | Status: DC

## 2019-10-05 MED ORDER — ONDANSETRON HCL 4 MG/2ML IJ SOLN: 4.0000 mg | Freq: Four times a day (QID) | INTRAMUSCULAR | Status: DC | PRN

## 2019-10-05 MED ORDER — LABETALOL HCL 5 MG/ML IV SOLN: 10.0000 mg | INTRAVENOUS | Status: DC | PRN

## 2019-10-05 SURGICAL SUPPLY — 26 items
BALLN IN.PACT DCB 6X80 (BALLOONS) ×3
BALLN STERLING OTW 5X80X135 (BALLOONS) ×3
BALLOON STERLING OTW 5X80X135 (BALLOONS) ×2 IMPLANT
CATH ANGIO 5F PIGTAIL 65CM (CATHETERS) ×3 IMPLANT
CATH CROSS OVER TEMPO 5F (CATHETERS) ×3 IMPLANT
CATH HAWKONE LX EXTENDED TIP (CATHETERS) ×3 IMPLANT
CLOSURE MYNX CONTROL 6F/7F (Vascular Products) ×3 IMPLANT
DCB IN.PACT 6X80 (BALLOONS) ×2 IMPLANT
DEVICE SPIDERFX EMB PROT 6MM (WIRE) ×3 IMPLANT
KIT ENCORE 26 ADVANTAGE (KITS) ×3 IMPLANT
KIT PV (KITS) ×3 IMPLANT
SHEATH HIGHFLEX ANSEL 7FR 55CM (SHEATH) ×3 IMPLANT
SHEATH PINNACLE 5F 10CM (SHEATH) ×3 IMPLANT
SHEATH PINNACLE 7F 10CM (SHEATH) ×3 IMPLANT
SHEATH PROBE COVER 6X72 (BAG) ×3 IMPLANT
STENT ABSOLUTE PRO 6X100X135 (Permanent Stent) ×3 IMPLANT
STOPCOCK MORSE 400PSI 3WAY (MISCELLANEOUS) ×3 IMPLANT
SYR MEDRAD MARK 7 150ML (SYRINGE) ×3 IMPLANT
TAPE VIPERTRACK RADIOPAQ (MISCELLANEOUS) ×2 IMPLANT
TAPE VIPERTRACK RADIOPAQUE (MISCELLANEOUS) ×1
TRANSDUCER W/STOPCOCK (MISCELLANEOUS) ×3 IMPLANT
TRAY PV CATH (CUSTOM PROCEDURE TRAY) ×3 IMPLANT
TUBING CIL FLEX 10 FLL-RA (TUBING) ×3 IMPLANT
WIRE HITORQ VERSACORE ST 145CM (WIRE) ×3 IMPLANT
WIRE J 3MM .035X145CM (WIRE) ×3 IMPLANT
WIRE SPARTACORE .014X300CM (WIRE) ×3 IMPLANT

## 2019-10-05 NOTE — Interval H&P Note (Signed)
History and Physical Interval Note:  10/05/2019 7:36 AM  Jeffrey Winters  has presented today for surgery, with the diagnosis of PAD.  The various methods of treatment have been discussed with the patient and family. After consideration of risks, benefits and other options for treatment, the patient has consented to  Procedure(s): ABDOMINAL AORTOGRAM W/LOWER EXTREMITY (Bilateral) as a surgical intervention.  The patient's history has been reviewed, patient examined, no change in status, stable for surgery.  I have reviewed the patient's chart and labs.  Questions were answered to the patient's satisfaction.     Quay Burow

## 2019-10-05 NOTE — Discharge Instructions (Signed)
    Discharge Instructions  Lower Extremity Angiogram; Angioplasty/Stenting  Please refer to the following instructions for your post-procedure care. Your surgeon or physician assistant will discuss any changes with you.  Activity  Avoid lifting more than 8 pounds (1 gallons of milk) for 72 hours (3 days) after your procedure. You may walk as much as you can tolerate. It's OK to drive after 72 hours.  Bathing/Showering  You may shower the day after your procedure. If you have a bandage, you may remove it at 24- 48 hours. Clean your incision site with mild soap and water. Pat the area dry with a clean towel.  Diet  DRINK PLENTY OF FLUIDS FOR THE NEXT 2-3 DAYS.  Resume your pre-procedure diet. There are no special food restrictions following this procedure. All patients with peripheral vascular disease should follow a low fat/low cholesterol diet. In order to heal from your surgery, it is CRITICAL to get adequate nutrition. Your body requires vitamins, minerals, and protein. Vegetables are the best source of vitamins and minerals. Vegetables also provide the perfect balance of protein. Processed food has little nutritional value, so try to avoid this.  Medications  Resume taking all of your medications unless your doctor tells you not to. If your incision is causing pain, you may take over-the-counter pain relievers such as acetaminophen (Tylenol)  Follow Up  Follow up will be arranged at the time of your procedure. You may have an office visit scheduled or may be scheduled for surgery. Ask your surgeon if you have any questions.  Please call us immediately for any of the following conditions: .Severe or worsening pain your legs or feet at rest or with walking. .Increased pain, redness, drainage at your groin puncture site. .Fever of 101 degrees or higher. .If you have any mild or slow bleeding from your puncture site: lie down, apply firm constant pressure over the area with a piece  of gauze or a clean wash cloth for 30 minutes- no peeking!, call 911 right away if you are still bleeding after 30 minutes, or if the bleeding is heavy and unmanageable.  Reduce your risk factors of vascular disease:  Stop smoking. If you would like help call [facility] at 1-800-QUIT-NOW (762) 569-8725) or [place] at (754) 334-7984. Manage your cholesterol Maintain a desired weight Control your diabetes Keep your blood pressure down  If you have any questions, please call Dr. Kennon Holter office.

## 2019-10-17 ENCOUNTER — Other Ambulatory Visit: Payer: Self-pay

## 2019-10-17 ENCOUNTER — Encounter: Payer: Self-pay | Admitting: Physician Assistant

## 2019-10-17 ENCOUNTER — Ambulatory Visit (INDEPENDENT_AMBULATORY_CARE_PROVIDER_SITE_OTHER): Payer: Self-pay | Admitting: Physician Assistant

## 2019-10-17 ENCOUNTER — Encounter (HOSPITAL_COMMUNITY): Payer: Self-pay

## 2019-10-17 VITALS — BP 120/58 | HR 72 | Ht 65.0 in | Wt 225.0 lb

## 2019-10-17 DIAGNOSIS — I739 Peripheral vascular disease, unspecified: Secondary | ICD-10-CM

## 2019-10-17 DIAGNOSIS — I25119 Atherosclerotic heart disease of native coronary artery with unspecified angina pectoris: Secondary | ICD-10-CM

## 2019-10-17 DIAGNOSIS — I10 Essential (primary) hypertension: Secondary | ICD-10-CM

## 2019-10-17 DIAGNOSIS — R002 Palpitations: Secondary | ICD-10-CM

## 2019-10-17 DIAGNOSIS — E785 Hyperlipidemia, unspecified: Secondary | ICD-10-CM

## 2019-10-17 NOTE — Progress Notes (Signed)
Cardiology Office Note:    Date:  10/17/2019   ID:  Jeffrey Winters, DOB [birth year], MRN [id number]  PCP:  Nicolette Bang, DO  Cardiologist:  Sherren Mocha, MD  Electrophysiologist:  None   Referring MD: Caryl Never*   Chief Complaint:  Follow-up (CAD)    Patient Profile:    Jeffrey Winters is a 54 y.o. male with:   Coronary artery disease ? S/p NSTEMI in 02/2016 tx with Xience DES to OM1 ? s/p NSTEMI in 05/2016 ? S/p Inf STEMI 07/2019 >> s/p DES to RCA; OM1 stent patent ? Residual RPL2 80% >> Med Rx ? Myoview 08/2019: small inf ischemia; low risk  Peripheral Arterial Disease    S/p L SFA PTA 10/2019  Hypertension   Hyperlipidemia   Tobacco use  Prior CV studies:  LE Arteriogram/PTA 10/05/2019               1.  Ultrasound-guided right common femoral access               2.  Abdominal aortogram/bilateral iliac angiogram/bifemoral runoff               3.  Contralateral access (second order catheter placeme 5 nt)               4.  Placement of spider distal protection device below the knee left popliteal artery               5.  Hawk 1 directional atherectomy left SFA               6.  Drug-coated balloon angioplasty left SFA               7.  Abbott absolute Pro nitinol self-expanding stent left SFA               8.  Right common femoral angiogram/Mynx closure device  Myoview 08/25/2019 EF 61, small area of inferior ischemia; low risk  LE Art [place] 08/2019:  R SFA 50-74; L SFA 50-74  Echocardiogram 07/11/2019 EF 55, mild LVH, inf HK, Gr 2 DD, normal RVSF, trace MR, trivial TR, RVSP 36.3  Cardiac catheterization 07/10/2019 LAD mid 55, dist 40 LCx irregs; OM2 stent patent RCA mid 100, dist 50; RPL2 80 PCI: 3 x 38 mm Resolute Onyx DES to mid RCA  Cardiac catheterization 02/26/16 ([place]) 2. 2.5 x 18 mm Xience drug eluting stent to the OM2   History of Present Illness:    Jeffrey Winters was last seen in clinic by me in January 2021.  He had  recurrent chest symptoms and I had him undergo a nuclear stress test.  This demonstrated a small area of moderate intensity ischemia in the inferior base.  [name] reviewed his films and noted he has a large PL branch with severe stenosis in the midportion.  It was felt that medical therapy could be pursued.  PCI could be considered if he had refractory angina.  In the meantime, he was evaluated by Dr. Gwenlyn Found for Peripheral Arterial Disease.  He underwent an angiogram which demonstrated bilateral SFA disease.  He underwent atherectomy and drug-coated balloon angioplasty to the left SFA 10/05/2019.  The plan is to bring him back for intervention on his right SFA.  He has follow up dopplers and an appt with Dr. Gwenlyn Found later this month.    He returns for follow-up.  He is here alone.  When last seen, he complained of palpitations.  I  suggested obtaining an event monitor.  Unfortunately he is unable to afford this.  His palpitations have been infrequent since last seen.  He has not had syncope.  He has not had further chest discomfort.  He has occasional shortness of breath without significant change.  He has not had orthopnea, leg swelling.  He still has some bilateral calf claudication.  Overall, his left leg seems to be better.  Past Medical History:  Diagnosis Date  . Coronary artery disease 2017   MI s/p PCI to OM2 // s/p MI 07/2019 >> DES to RCA; residual RPL2 80 // Myoview 08/2019: EF 61, small area of mod ischemia inf base; Low Risk  . Hypertension   . PAD (peripheral artery disease) ([place])    LE Art [place] 08/2019: R SFA 50-74; L SFA 50-74    Current Medications: Current Meds  Medication Sig  . aspirin 81 MG chewable tablet Chew 1 tablet (81 mg total) by mouth daily.  Marland Kitchen atorvastatin (LIPITOR) 80 MG tablet Take 1 tablet (80 mg total) by mouth daily at 6 PM.  . famotidine (PEPCID) 20 MG tablet Take 1 tablet (20 mg total) by mouth 2 (two) times daily.  . isosorbide mononitrate (IMDUR) 30 MG 24 hr tablet  Take 0.5 tablets (15 mg total) by mouth daily.  . metoprolol succinate (TOPROL XL) 25 MG 24 hr tablet Take 1 tablet by mouth once a day, may take an additional tablet as needed for palpitations  . nitroGLYCERIN (NITROSTAT) 0.4 MG SL tablet Place 1 tablet (0.4 mg total) under the tongue every 5 (five) minutes as needed.  . prasugrel (EFFIENT) 10 MG TABS tablet Take 1 tablet (10 mg total) by mouth daily.     Allergies:   Patient has no known allergies.   Social History   Tobacco Use  . Smoking status: Current Every Day Smoker  . Smokeless tobacco: Never Used  . Tobacco comment: 4 cigs/day  Substance Use Topics  . Alcohol use: Not Currently  . Drug use: No     Family Hx: The patient's family history includes Heart disease in his father and mother.  ROS   EKGs/Labs/Other Test Reviewed:    EKG:  EKG is not ordered today.  The ekg ordered today demonstrates n/a  Recent Labs: 07/10/2019: ALT 14 07/21/2019: NT-Pro BNP 233 08/15/2019: Magnesium 1.9; TSH 1.810 09/13/2019: BUN 12; Creatinine, Ser 1.05; Hemoglobin 14.4; Platelets 210; Potassium 4.6; Sodium 140   Recent Lipid Panel Lab Results  Component Value Date/Time   CHOL 215 (H) 07/10/2019 02:00 PM   TRIG 195 (H) 07/10/2019 02:00 PM   HDL 34 (L) 07/10/2019 02:00 PM   CHOLHDL 6.3 07/10/2019 02:00 PM   LDLCALC 142 (H) 07/10/2019 02:00 PM    Physical Exam:    VS:  BP (!) 120/58   Pulse 72   Ht 5\' 5"  (1.651 m)   Wt 225 lb (102.1 kg)   SpO2 97%   BMI 37.44 kg/m     Wt Readings from Last 3 Encounters:  10/17/19 225 lb (102.1 kg)  10/05/19 215 lb (97.5 kg)  10/03/19 225 lb (102.1 kg)     Constitutional:      Appearance: Healthy appearance. Not in distress.  Neck:     Vascular: JVD normal.  Pulmonary:     Effort: Pulmonary effort is normal.     Breath sounds: No wheezing. No rales.  Cardiovascular:     Normal rate. Regular rhythm. Normal S1. Normal S2.  Murmurs: There is no murmur.  Edema:    Peripheral edema  absent.  Abdominal:     Palpations: Abdomen is soft. There is no hepatomegaly.  Skin:    General: Skin is warm and dry.  Neurological:     General: No focal deficit present.     Mental Status: Alert and oriented to person, place and time.     Cranial Nerves: Cranial nerves are intact.      ASSESSMENT & PLAN:    1. Coronary artery disease involving native coronary artery of native heart with angina pectoris [facility]) Status post myocardial infarction in 2017 treated with DES to the OM1 at [facility].  He is status post inferior STEMI in 07/2019 treated with a DES to the RCA.  He has residual stenosis with 80% in the RPL 2.  Recent Myoview demonstrated small amount of inferior ischemia.  The study was felt to be low risk.  I reviewed this with [name] and we felt that the patient could be treated medically.  If he failed medical therapy, PCI could be considered.  Patient is currently doing well without anginal symptoms on aspirin, atorvastatin, isosorbide, metoprolol succinate and prasugrel.  He will continue his current medical therapy and follow-up in 6 months.  2. PAD (peripheral artery disease) (HCC) Status post angioplasty to the left SFA.  He sees Dr. Gwenlyn Found later this month to arrange intervention on the right SFA.  3. Essential hypertension The patient's blood pressure is controlled on his current regimen.  Continue current therapy.   4. Hyperlipidemia, unspecified hyperlipidemia type Continue high intensity statin.  Arrange fasting lipids, LFTs  5. Palpitations His palpitations are somewhat infrequent.  They are not prolonged.  Hopefully, he will be able to afford the monitor and complete this.  I have encouraged him to go the emergency room if he has palpitations that are sustained.  I also recommended that he take an extra metoprolol succinate if his palpitations go on for a prolonged period of time   Dispo:  Return in about 6 months (around 04/18/2020) for Routine Follow Up,  w/ [name], or [name], PA-C, in person.   Medication Adjustments/Labs and Tests Ordered: Current medicines are reviewed at length with the patient today.  Concerns regarding medicines are outlined above.  Tests Ordered: Orders Placed This Encounter  Procedures  . Hepatic function panel  . Lipid panel   Medication Changes: No orders of the defined types were placed in this encounter.   Signed, [name], PA-C  10/17/2019 4:02 PM    [facility] [place], [place], [place]  [address] Phone: [phone]; Fax: [phone]

## 2019-10-17 NOTE — Patient Instructions (Addendum)
Medication Instructions:   Your physician recommends that you continue on your current medications as directed. Please refer to the Current Medication list given to you today.  *If you need a refill on your cardiac medications before your next appointment, please call your pharmacy*  Lab Work:  Your physician recommends that you return for lab work in 2 weeks on 10/31/19 at 9:15AM  If you have labs (blood work) drawn today and your tests are completely normal, you will receive your results only by: Marland Kitchen MyChart Message (if you have MyChart) OR . A paper copy in the mail If you have any lab test that is abnormal or we need to change your treatment, we will call you to review the results.  Testing/Procedures:  None ordered today  Follow-Up: At [facility], you and your health needs are our priority.  As part of our continuing mission to provide you with exceptional heart care, we have created designated Provider Care Teams.  These Care Teams include your primary Cardiologist (physician) and Advanced Practice Providers (APPs -  Physician Assistants and Nurse Practitioners) who all work together to provide you with the care you need, when you need it.  We recommend signing up for the patient portal called "MyChart".  Sign up information is provided on this After Visit Summary.  MyChart is used to connect with patients for Virtual Visits (Telemedicine).  Patients are able to view lab/test results, encounter notes, upcoming appointments, etc.  Non-urgent messages can be sent to your provider as well.   To learn more about what you can do with MyChart, go to [email or web address].    Your next appointment:   6 month(s)  The format for your next appointment:   In Person  Provider:   You may see Sherren Mocha, MD or [name], PA-C

## 2019-10-18 ENCOUNTER — Ambulatory Visit (HOSPITAL_COMMUNITY)
Admission: RE | Admit: 2019-10-18 | Discharge: 2019-10-18 | Disposition: A | Discharge status: Home / Self Care | Payer: Self-pay | Source: Ambulatory Visit | Attending: Cardiovascular Disease | Admitting: Cardiovascular Disease

## 2019-10-18 ENCOUNTER — Other Ambulatory Visit (HOSPITAL_COMMUNITY): Payer: Self-pay | Admitting: Cardiovascular Disease

## 2019-10-18 DIAGNOSIS — I739 Peripheral vascular disease, unspecified: Secondary | ICD-10-CM | POA: Insufficient documentation

## 2019-10-18 DIAGNOSIS — I25119 Atherosclerotic heart disease of native coronary artery with unspecified angina pectoris: Secondary | ICD-10-CM | POA: Insufficient documentation

## 2019-10-20 ENCOUNTER — Other Ambulatory Visit: Payer: Self-pay

## 2019-10-24 ENCOUNTER — Encounter: Payer: Self-pay | Admitting: Cardiovascular Disease

## 2019-10-24 ENCOUNTER — Encounter: Payer: Self-pay | Admitting: Radiology

## 2019-10-24 ENCOUNTER — Ambulatory Visit (INDEPENDENT_AMBULATORY_CARE_PROVIDER_SITE_OTHER): Payer: Self-pay | Admitting: Cardiovascular Disease

## 2019-10-24 ENCOUNTER — Other Ambulatory Visit: Payer: Self-pay

## 2019-10-24 DIAGNOSIS — E782 Mixed hyperlipidemia: Secondary | ICD-10-CM

## 2019-10-24 DIAGNOSIS — I739 Peripheral vascular disease, unspecified: Secondary | ICD-10-CM

## 2019-10-24 NOTE — Assessment & Plan Note (Signed)
History of hyperlipidemia on high-dose statin therapy with lipid profile performed 07/10/2019 revealing total cholesterol 215, LDL 142 and HDL 34.  I do not think he has had a fasting lipid profile since starting his high-dose statin.  We will check a fasting lipid profile.

## 2019-10-24 NOTE — Assessment & Plan Note (Signed)
History of peripheral arterial disease status post left SFA Hawk 1 directional atherectomy followed by drug-coated balloon angioplasty with excellent result reducing 80% lesion to 0% residual.  His follow-up Dopplers normalized and his left calf claudication has resolved.  He does have 70% fairly focal calcified mid right SFA stenosis with similar symptoms.  I am going to arrange for him to undergo intervention on 11/09/2019 with shockwave followed by [facility].

## 2019-10-24 NOTE — Progress Notes (Signed)
10/24/2019 Jeffrey Winters   01/15/1966  [id number]  Primary Physician Nicolette Bang, DO Primary Cardiologist: Lorretta Harp MD FACP, [place], [place], [place]  HPI:  Jeffrey Winters is a 54 y.o.  mild to moderately overweight single African-American male with no children referred to me by [name], PA-C for treatment of symptomatic PAD. He does not have a primary care provider.  I last saw him in the office 10/03/2019. 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  I last saw him in the office 08/29/2019.His risk factors include 20 to 30 pack years of tobacco abuse continue to smoke 1/2 pack/day, treated hypertension hyperlipidemia. There is no family history of heart disease. He is never had a stroke. He had a non-STEMI in December and had treatment of his distal RCA. He had stenting of his distal RCA and obtuse marginal branch at [facility] 02/26/2016 as well. He does complain of left Solomon claudication at 50 to 75 feet which is symmetric bilaterally. He had Doppler studies performed 08/09/2019 revealing normal ABIs with moderate disease in both SFAs.  I performed peripheral angiography on him 10/05/2019 revealing high-grade fairly focal mid bilateral SFA stenoses.  I performed [facility] 1 directional atherectomy followed by drug-coated balloon angioplasty of the mid left SFA with an excellent result.  His Dopplers normalized and his left calf claudication has resolved.  He wishes to proceed with mid right SFA intervention for lifestyle limiting claudication.    Current Meds  Medication Sig  . aspirin 81 MG chewable tablet Chew 1 tablet (81 mg total) by mouth daily.  Marland Kitchen atorvastatin (LIPITOR) 80 MG tablet Take 1 tablet (80 mg total) by mouth daily at 6 PM.  . famotidine (PEPCID) 20 MG tablet Take 1 tablet (20 mg total) by mouth 2 (two) times daily.  . isosorbide mononitrate (IMDUR) 30 MG 24 hr tablet Take 0.5 tablets (15 mg total) by  mouth daily.  . metoprolol succinate (TOPROL XL) 25 MG 24 hr tablet Take 1 tablet by mouth once a day, may take an additional tablet as needed for palpitations  . nitroGLYCERIN (NITROSTAT) 0.4 MG SL tablet Place 1 tablet (0.4 mg total) under the tongue every 5 (five) minutes as needed.  . prasugrel (EFFIENT) 10 MG TABS tablet Take 1 tablet (10 mg total) by mouth daily.     No Known Allergies  Social History   Socioeconomic History  . Marital status: Single    Spouse name: Not on file  . Number of children: Not on file  . Years of education: Not on file  . Highest education level: Not on file  Occupational History  . Not on file  Tobacco Use  . Smoking status: Current Every Day Smoker  . Smokeless tobacco: Never Used  . Tobacco comment: 4 cigs/day  Substance and Sexual Activity  . Alcohol use: Not Currently  . Drug use: No  . Sexual activity: Not on file  Other Topics Concern  . Not on file  Social History Narrative  . Not on file   Social Determinants of Health   Financial Resource Strain:   . Difficulty of Paying Living Expenses:   Food Insecurity:   . Worried About Charity fundraiser in the Last Year:   . Arboriculturist in the Last Year:   Transportation Needs:   . Film/video editor (Medical):   Marland Kitchen Lack of Transportation (Non-Medical):   Physical Activity:   .  Days of Exercise per Week:   . Minutes of Exercise per Session:   Stress:   . Feeling of Stress :   Social Connections:   . Frequency of Communication with Friends and Family:   . Frequency of Social Gatherings with Friends and Family:   . Attends Religious Services:   . Active Member of Clubs or Organizations:   . Attends Archivist Meetings:   Marland Kitchen Marital Status:   Intimate Partner Violence:   . Fear of Current or Ex-Partner:   . Emotionally Abused:   Marland Kitchen Physically Abused:   . Sexually Abused:      Review of Systems: General: negative for chills, fever, night sweats or weight  changes.  Cardiovascular: negative for chest pain, dyspnea on exertion, edema, orthopnea, palpitations, paroxysmal nocturnal dyspnea or shortness of breath Dermatological: negative for rash Respiratory: negative for cough or wheezing Urologic: negative for hematuria Abdominal: negative for nausea, vomiting, diarrhea, bright red blood per rectum, melena, or hematemesis Neurologic: negative for visual changes, syncope, or dizziness All other systems reviewed and are otherwise negative except as noted above.    Blood pressure 134/70, pulse 67, height 5\' 5"  (1.651 m), weight 224 lb (101.6 kg), SpO2 95 %.  General appearance: alert and no distress Neck: no adenopathy, no carotid bruit, no JVD, supple, symmetrical, trachea midline and thyroid not enlarged, symmetric, no tenderness/mass/nodules Lungs: clear to auscultation bilaterally Heart: regular rate and rhythm, S1, S2 normal, no murmur, click, rub or gallop Extremities: extremities normal, atraumatic, no cyanosis or edema Pulses: Diminished right pedal pulse Skin: Skin color, texture, turgor normal. No rashes or lesions Neurologic: Alert and oriented X 3, normal strength and tone. Normal symmetric reflexes. Normal coordination and gait  EKG not performed today  ASSESSMENT AND PLAN:   PAD (peripheral artery disease) (HCC) History of peripheral arterial disease status post left SFA Hawk 1 directional atherectomy followed by drug-coated balloon angioplasty with excellent result reducing 80% lesion to 0% residual.  His follow-up Dopplers normalized and his left calf claudication has resolved.  He does have 70% fairly focal calcified mid right SFA stenosis with similar symptoms.  I am going to arrange for him to undergo intervention on 11/09/2019 with shockwave followed by [facility].  Hyperlipidemia History of hyperlipidemia on high-dose statin therapy with lipid profile performed 07/10/2019 revealing total cholesterol 215, LDL 142 and HDL 34.  I do not  think he has had a fasting lipid profile since starting his high-dose statin.  We will check a fasting lipid profile.      Lorretta Harp MD FACP,FACC,FAHA, [facility] 10/24/2019 8:08 AM

## 2019-10-24 NOTE — Progress Notes (Signed)
Late entry... Received at fax from [place] on 08/17/19. Patient requested a benefit quote for his event monitor. The benefit quote team as attempted to reach out to the patient to provide the requested quote and has not been successful. Monitor was returned unused on 10/23/19. Monitor order will be cancelled.

## 2019-10-24 NOTE — Patient Instructions (Addendum)
    Jeffrey Winters [place] [address] Dept: [phone] Loc: [place]  10/24/2019  You are scheduled for a Peripheral Angiogram on Thursday, April 8 with Dr. Quay Burow.  1. Please arrive at the [facility] (Main Entrance A) at [facility]: [address] [place], [place] [address] at 7:30 AM (This time is two hours before your procedure to ensure your preparation). Free valet parking service is available.   Special note: Every effort is made to have your procedure done on time. Please understand that emergencies sometimes delay scheduled procedures.  2. Diet: Do not eat solid foods after midnight.  The patient may have clear liquids until 5am upon the day of the procedure.  3. Labs: Your physician recommends that you return for lab work PRIOR TO EATING   GO TO [facility] ON Monday 11-06-19@ 8:55 AM FOR COVID TESTING  4. Medication instructions in preparation for your procedure:  On the morning of your procedure, take your Effient/Prasugrel and any morning medicines NOT listed above.  You may use sips of water.  5. Plan for one night stay--bring personal belongings. 6. Bring a current list of your medications and current insurance cards. 7. You MUST have a responsible person to drive you home. 8. Someone MUST be with you the first 24 hours after you arrive home or your discharge will be delayed. 9. Please wear clothes that are easy to get on and off and wear slip-on shoes.  Thank you for allowing [place] to care for you!   -- [facility]   Your physician has requested that you have a lower extremity arterial duplex. During this test, ultrasound are used to evaluate arterial blood flow in the legs. Allow one hour for this exam. There are no restrictions or special instructions. [place] physician  recommends that you schedule a follow-up appointment in: 2 [place]

## 2019-10-30 MED FILL — ISOSORBIDE MN ER 30 MG TAB: 30 | 30 days supply | Qty: 15 | Fill #2

## 2019-10-30 MED FILL — PRASUGREL 10 MG TABLET: 10 | 30 days supply | Qty: 30 | Fill #2

## 2019-10-31 ENCOUNTER — Other Ambulatory Visit: Payer: Self-pay

## 2019-10-31 DIAGNOSIS — I25119 Atherosclerotic heart disease of native coronary artery with unspecified angina pectoris: Secondary | ICD-10-CM

## 2019-10-31 DIAGNOSIS — E785 Hyperlipidemia, unspecified: Secondary | ICD-10-CM

## 2019-10-31 LAB — LIPID PANEL
Chol/HDL Ratio: 3.6 ratio (ref 0.0–5.0)
Cholesterol, Total: 112 mg/dL (ref 100–199)
HDL: 31 mg/dL — ABNORMAL LOW (ref >39)
LDL Chol Calc (NIH): 56 mg/dL (ref 0–99)
Triglycerides: 143 mg/dL (ref 0–149)
VLDL Cholesterol Cal: 25 mg/dL (ref 5–40)

## 2019-10-31 LAB — HEPATIC FUNCTION PANEL
ALT: 21 IU/L (ref 0–44)
AST: 20 IU/L (ref 0–40)
Albumin: 3.7 g/dL — ABNORMAL LOW (ref 3.8–4.9)
Alkaline Phosphatase: 129 IU/L — ABNORMAL HIGH (ref 39–117)
Bilirubin Total: 0.3 mg/dL (ref 0.0–1.2)
Bilirubin, Direct: 0.1 mg/dL (ref 0.00–0.40)
Total Protein: 6.6 g/dL (ref 6.0–8.5)

## 2019-11-01 MED FILL — ATORVASTATIN 80 MG TABLET: 80 | 30 days supply | Qty: 30 | Fill #1

## 2019-11-06 ENCOUNTER — Other Ambulatory Visit (HOSPITAL_COMMUNITY)
Admission: RE | Admit: 2019-11-06 | Discharge: 2019-11-06 | Disposition: A | Discharge status: Home / Self Care | Payer: HRSA Program | Source: Ambulatory Visit | Attending: Cardiovascular Disease | Admitting: Cardiovascular Disease

## 2019-11-06 ENCOUNTER — Telehealth: Payer: Self-pay

## 2019-11-06 DIAGNOSIS — Z20822 Contact with and (suspected) exposure to covid-19: Secondary | ICD-10-CM | POA: Insufficient documentation

## 2019-11-06 DIAGNOSIS — Z01812 Encounter for preprocedural laboratory examination: Secondary | ICD-10-CM | POA: Diagnosis present

## 2019-11-06 LAB — SARS CORONAVIRUS 2 (TAT 6-24 HRS): SARS Coronavirus 2: NEGATIVE

## 2019-11-06 NOTE — Telephone Encounter (Signed)
Spoke to patient he will have bmet and cbc done at [facility] office tomorrow 4/6.

## 2019-11-08 ENCOUNTER — Telehealth: Payer: Self-pay | Admitting: *Deleted

## 2019-11-08 LAB — LIPID PANEL
Chol/HDL Ratio: 3.3 ratio (ref 0.0–5.0)
Cholesterol, Total: 115 mg/dL (ref 100–199)
HDL: 35 mg/dL — ABNORMAL LOW (ref >39)
LDL Chol Calc (NIH): 59 mg/dL (ref 0–99)
Triglycerides: 117 mg/dL (ref 0–149)
VLDL Cholesterol Cal: 21 mg/dL (ref 5–40)

## 2019-11-08 LAB — BASIC METABOLIC PANEL
BUN/Creatinine Ratio: 13 (ref 9–20)
BUN: 14 mg/dL (ref 6–24)
CO2: 23 mmol/L (ref 20–29)
Calcium: 9.4 mg/dL (ref 8.7–10.2)
Chloride: 104 mmol/L (ref 96–106)
Creatinine, Ser: 1.04 mg/dL (ref 0.76–1.27)
GFR calc Af Amer: 94 mL/min/{1.73_m2} (ref >59)
GFR calc non Af Amer: 81 mL/min/{1.73_m2} (ref >59)
Glucose: 90 mg/dL (ref 65–99)
Potassium: 4.7 mmol/L (ref 3.5–5.2)
Sodium: 142 mmol/L (ref 134–144)

## 2019-11-08 LAB — HEPATIC FUNCTION PANEL
ALT: 20 IU/L (ref 0–44)
AST: 21 IU/L (ref 0–40)
Albumin: 4 g/dL (ref 3.8–4.9)
Alkaline Phosphatase: 135 IU/L — ABNORMAL HIGH (ref 39–117)
Bilirubin Total: 0.3 mg/dL (ref 0.0–1.2)
Bilirubin, Direct: 0.09 mg/dL (ref 0.00–0.40)
Total Protein: 6.7 g/dL (ref 6.0–8.5)

## 2019-11-08 LAB — CBC
Hematocrit: 43.1 % (ref 37.5–51.0)
Hemoglobin: 14.2 g/dL (ref 13.0–17.7)
MCH: 27.2 pg (ref 26.6–33.0)
MCHC: 32.9 g/dL (ref 31.5–35.7)
MCV: 83 fL (ref 79–97)
Platelets: 178 10*3/uL (ref 150–450)
RBC: 5.22 x10E6/uL (ref 4.14–5.80)
RDW: 13.9 % (ref 11.6–15.4)
WBC: 4 10*3/uL (ref 3.4–10.8)

## 2019-11-08 NOTE — Progress Notes (Signed)
Show:Clear all  ManualTemplateCopied Added by:  Lorretta Harp, MD [place] for details                                                                           11/08/19 Jeffrey Winters  Sep 19, 1965  [id number]  Primary Physician Liliane Shi, PA-C  Primary Cardiologist: Lorretta Harp MD FACP, [place], [place], [place]  HPI: Mandeep Kostas is a 54 y.o. mild to moderately overweight single African-American male with no children referred to me by [name], PA-C for treatment of symptomatic PAD. He does not have a primary care provider. 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 I last saw him in the office 08/29/2019. His risk factors include 20 to 30 pack years of tobacco abuse continue to smoke 1/2 pack/day, treated hypertension hyperlipidemia. There is no family history of heart disease. He is never had a stroke. He had a non-STEMI in December and had treatment of his distal RCA. He had stenting of his distal RCA and obtuse marginal branch at [facility] 02/26/2016 as well. He does complain of left Solomon claudication at 50 to 75 feet which is symmetric bilaterally. He had Doppler studies performed 08/09/2019 revealing normal ABIs with moderate disease in both SFAs.  Since I saw him 6 weeks ago he continues to have claudication. He wishes to proceed with angiography potential endovascular therapy for lifestyle limiting claudication. He scheduled for this Thursday.  Active Medications                                                 No Known Allergies  Social History        Socioeconomic History  . Marital status: Single    Spouse name: Not on file  . Number of children: Not on file  . Years of education: Not on file  . Highest education level: Not on file  Occupational History  . Not on file  Tobacco Use  . Smoking status: Current Every Day Smoker  . Smokeless  tobacco: Never Used  . Tobacco comment: 4 cigs/day  Substance and Sexual Activity  . Alcohol use: Not Currently  . Drug use: No  . Sexual activity: Not on file  Other Topics Concern  . Not on file  Social History Narrative  . Not on file   Social Determinants of Health      Financial Resource Strain:   . Difficulty of Paying Living Expenses: Not on file  Food Insecurity:   . Worried About Charity fundraiser in the Last Year: Not on file  . Ran Out of Food in the Last Year: Not on file  Transportation Needs:   . Lack of Transportation (Medical): Not on file  . Lack of Transportation (Non-Medical): Not on file  Physical Activity:   . Days of Exercise per Week: Not on file  . Minutes of Exercise per Session: Not on file  Stress:   . Feeling of Stress : Not on file  Social Connections:   . Frequency of  Communication with Friends and Family: Not on file  . Frequency of Social Gatherings with Friends and Family: Not on file  . Attends Religious Services: Not on file  . Active Member of Clubs or Organizations: Not on file  . Attends Archivist Meetings: Not on file  . Marital Status: Not on file  Intimate Partner Violence:   . Fear of Current or Ex-Partner: Not on file  . Emotionally Abused: Not on file  . Physically Abused: Not on file  . Sexually Abused: Not on file   Review of Systems:  General: negative for chills, fever, night sweats or weight changes.  Cardiovascular: negative for chest pain, dyspnea on exertion, edema, orthopnea, palpitations, paroxysmal nocturnal dyspnea or shortness of breath  Dermatological: negative for rash  Respiratory: negative for cough or wheezing  Urologic: negative for hematuria  Abdominal: negative for nausea, vomiting, diarrhea, bright red blood per rectum, melena, or hematemesis  Neurologic: negative for visual changes, syncope, or dizziness  All other systems reviewed and are otherwise negative except as noted above.  Blood  pressure 130/60, pulse 72, temperature 98.2 F (36.8 C), height 5\' 7"  (1.702 m), weight 225 lb (102.1 kg).  General appearance: alert and no distress  Neck: no adenopathy, no carotid bruit, no JVD, supple, symmetrical, trachea midline and thyroid not enlarged, symmetric, no tenderness/mass/nodules  Lungs: clear to auscultation bilaterally  Heart: regular rate and rhythm, S1, S2 normal, no murmur, click, rub or gallop  Extremities: extremities normal, atraumatic, no cyanosis or edema  Pulses: 2+ and symmetric  Skin: Skin color, texture, turgor normal. No rashes or lesions  Neurologic: Alert and oriented X 3, normal strength and tone. Normal symmetric reflexes. Normal coordination and gait  EKG not performed today  ASSESSMENT AND PLAN:  PAD (peripheral artery disease) ([place])  For follow-up prior to his scheduled PV angiogram this coming Thursday. He does have a history of ischemic heart disease. He has less limiting claudication which is fairly symmetric and Doppler studies performed 08/09/2019 revealing fairly normal ABIs with bilateral SFA disease. His symptoms are lifestyle limiting. We will plan on performing angiography and endovascular therapy.   Lorretta Harp, M.D., [place], [facility], [place] [address] [place], [place]  [address]  [phone] 11/08/2019 12:31 PM

## 2019-11-08 NOTE — H&P (View-Only) (Signed)
Show:Clear all  ManualTemplateCopied Added by:  Lorretta Harp, MD [place] for details                                                                           11/08/19 Jeffrey Winters  1966/04/21  [id number]  Primary Physician Liliane Shi, PA-C  Primary Cardiologist: Lorretta Harp MD FACP, [place], [place], [place]  HPI: Jeffrey Winters is a 54 y.o. mild to moderately overweight single African-American male with no children referred to me by [name], PA-C for treatment of symptomatic PAD. He does not have a primary care provider. 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 I last saw him in the office 08/29/2019. His risk factors include 20 to 30 pack years of tobacco abuse continue to smoke 1/2 pack/day, treated hypertension hyperlipidemia. There is no family history of heart disease. He is never had a stroke. He had a non-STEMI in December and had treatment of his distal RCA. He had stenting of his distal RCA and obtuse marginal branch at [facility] 02/26/2016 as well. He does complain of left Solomon claudication at 50 to 75 feet which is symmetric bilaterally. He had Doppler studies performed 08/09/2019 revealing normal ABIs with moderate disease in both SFAs.  Since I saw him 6 weeks ago he continues to have claudication. He wishes to proceed with angiography potential endovascular therapy for lifestyle limiting claudication. He scheduled for this Thursday.  Active Medications                                                 No Known Allergies  Social History        Socioeconomic History  . Marital status: Single    Spouse name: Not on file  . Number of children: Not on file  . Years of education: Not on file  . Highest education level: Not on file  Occupational History  . Not on file  Tobacco Use  . Smoking status: Current Every Day Smoker  . Smokeless  tobacco: Never Used  . Tobacco comment: 4 cigs/day  Substance and Sexual Activity  . Alcohol use: Not Currently  . Drug use: No  . Sexual activity: Not on file  Other Topics Concern  . Not on file  Social History Narrative  . Not on file   Social Determinants of Health      Financial Resource Strain:   . Difficulty of Paying Living Expenses: Not on file  Food Insecurity:   . Worried About Charity fundraiser in the Last Year: Not on file  . Ran Out of Food in the Last Year: Not on file  Transportation Needs:   . Lack of Transportation (Medical): Not on file  . Lack of Transportation (Non-Medical): Not on file  Physical Activity:   . Days of Exercise per Week: Not on file  . Minutes of Exercise per Session: Not on file  Stress:   . Feeling of Stress : Not on file  Social Connections:   . Frequency of  Communication with Friends and Family: Not on file  . Frequency of Social Gatherings with Friends and Family: Not on file  . Attends Religious Services: Not on file  . Active Member of Clubs or Organizations: Not on file  . Attends Archivist Meetings: Not on file  . Marital Status: Not on file  Intimate Partner Violence:   . Fear of Current or Ex-Partner: Not on file  . Emotionally Abused: Not on file  . Physically Abused: Not on file  . Sexually Abused: Not on file   Review of Systems:  General: negative for chills, fever, night sweats or weight changes.  Cardiovascular: negative for chest pain, dyspnea on exertion, edema, orthopnea, palpitations, paroxysmal nocturnal dyspnea or shortness of breath  Dermatological: negative for rash  Respiratory: negative for cough or wheezing  Urologic: negative for hematuria  Abdominal: negative for nausea, vomiting, diarrhea, bright red blood per rectum, melena, or hematemesis  Neurologic: negative for visual changes, syncope, or dizziness  All other systems reviewed and are otherwise negative except as noted above.  Blood  pressure 130/60, pulse 72, temperature 98.2 F (36.8 C), height 5\' 7"  (1.702 m), weight 225 lb (102.1 kg).  General appearance: alert and no distress  Neck: no adenopathy, no carotid bruit, no JVD, supple, symmetrical, trachea midline and thyroid not enlarged, symmetric, no tenderness/mass/nodules  Lungs: clear to auscultation bilaterally  Heart: regular rate and rhythm, S1, S2 normal, no murmur, click, rub or gallop  Extremities: extremities normal, atraumatic, no cyanosis or edema  Pulses: 2+ and symmetric  Skin: Skin color, texture, turgor normal. No rashes or lesions  Neurologic: Alert and oriented X 3, normal strength and tone. Normal symmetric reflexes. Normal coordination and gait  EKG not performed today  ASSESSMENT AND PLAN:  PAD (peripheral artery disease) ([place])  For follow-up prior to his scheduled PV angiogram this coming Thursday. He does have a history of ischemic heart disease. He has less limiting claudication which is fairly symmetric and Doppler studies performed 08/09/2019 revealing fairly normal ABIs with bilateral SFA disease. His symptoms are lifestyle limiting. We will plan on performing angiography and endovascular therapy.   Lorretta Harp, M.D., [place], [facility], [place] [address] [place], [place]  [address]  [phone] 11/08/2019 12:31 PM

## 2019-11-08 NOTE — Telephone Encounter (Signed)
Pt contacted pre-abdominal aortogram  scheduled at [facility] for: Thursday November 09, 2019 9:30 AM Verified arrival time and place: [facility]) at: 7:30 AM   No solid food after midnight prior to cath, clear liquids until 5 AM day of procedure.  AM meds can be  taken pre-cath with sip of water including: ASA 81 mg Effient 10 mg  Confirmed patient has responsible adult to drive home post procedure and observe 24 hours after arriving home: yes  Currently, due to Covid-19 pandemic, only one person will be allowed with patient. Must be the same person for patient's entire stay and will be required to wear a mask. They will be asked to wait in the waiting room for the duration of the patient's stay.  Patients are required to wear a mask when they enter the hospital.      COVID-19 Pre-Screening Questions:  . In the past 7 to 10 days have you had a cough,  shortness of breath, headache, congestion, fever (100 or greater) body aches, chills, sore throat, or sudden loss of taste or sense of smell? no . Have you been around anyone with known Covid 19 in the past 7 to 10 days? no . Have you been around anyone who is awaiting Covid 19 test results in the past 7 to 10 days? no . Have you been around anyone who has mentioned symptoms of Covid 19 within the past 7 to 10 days? No  I reviewed procedure/mask/visitor instructions, COVID-19 screening questions with patient.

## 2019-11-09 ENCOUNTER — Encounter (HOSPITAL_COMMUNITY)
Admission: RE | Disposition: A | Discharge status: Home / Self Care | Payer: Self-pay | Source: Home / Self Care | Attending: Cardiovascular Disease

## 2019-11-09 ENCOUNTER — Other Ambulatory Visit: Payer: Self-pay

## 2019-11-09 ENCOUNTER — Ambulatory Visit (HOSPITAL_COMMUNITY)
Admission: RE | Admit: 2019-11-09 | Discharge: 2019-11-09 | Disposition: A | Discharge status: Home / Self Care | Payer: Self-pay | Attending: Cardiovascular Disease | Admitting: Cardiovascular Disease

## 2019-11-09 DIAGNOSIS — I739 Peripheral vascular disease, unspecified: Secondary | ICD-10-CM | POA: Diagnosis present

## 2019-11-09 DIAGNOSIS — I70211 Atherosclerosis of native arteries of extremities with intermittent claudication, right leg: Secondary | ICD-10-CM

## 2019-11-09 DIAGNOSIS — I252 Old myocardial infarction: Secondary | ICD-10-CM | POA: Insufficient documentation

## 2019-11-09 DIAGNOSIS — Z6836 Body mass index (BMI) 36.0-36.9, adult: Secondary | ICD-10-CM | POA: Insufficient documentation

## 2019-11-09 DIAGNOSIS — Z955 Presence of coronary angioplasty implant and graft: Secondary | ICD-10-CM | POA: Insufficient documentation

## 2019-11-09 DIAGNOSIS — E785 Hyperlipidemia, unspecified: Secondary | ICD-10-CM | POA: Insufficient documentation

## 2019-11-09 DIAGNOSIS — I10 Essential (primary) hypertension: Secondary | ICD-10-CM | POA: Insufficient documentation

## 2019-11-09 DIAGNOSIS — F17210 Nicotine dependence, cigarettes, uncomplicated: Secondary | ICD-10-CM | POA: Insufficient documentation

## 2019-11-09 DIAGNOSIS — E663 Overweight: Secondary | ICD-10-CM | POA: Insufficient documentation

## 2019-11-09 HISTORY — PX: INTRAVASCULAR LITHOTRIPSY: CATH118324

## 2019-11-09 HISTORY — PX: ABDOMINAL AORTOGRAM W/LOWER EXTREMITY: CATH118223

## 2019-11-09 HISTORY — PX: PERIPHERAL INTRAVASCULAR LITHOTRIPSY: CATH118324

## 2019-11-09 HISTORY — PX: PERIPHERAL VASCULAR BALLOON ANGIOPLASTY: CATH118281

## 2019-11-09 LAB — POCT ACTIVATED CLOTTING TIME
Activated Clotting Time: 257 seconds
Activated Clotting Time: 279 seconds

## 2019-11-09 SURGERY — ABDOMINAL AORTOGRAM W/LOWER EXTREMITY
Anesthesia: LOCAL

## 2019-11-09 MED ORDER — PRASUGREL HCL 10 MG PO TABS: 10.0000 mg | ORAL_TABLET | ORAL | Status: DC

## 2019-11-09 MED ORDER — MORPHINE SULFATE (PF) 2 MG/ML IV SOLN: 2.0000 mg | INTRAVENOUS | Status: DC | PRN

## 2019-11-09 MED ORDER — ONDANSETRON HCL 4 MG/2ML IJ SOLN: 4.0000 mg | Freq: Four times a day (QID) | INTRAMUSCULAR | Status: DC | PRN

## 2019-11-09 MED ORDER — SODIUM CHLORIDE 0.9% FLUSH: 3.0000 mL | Freq: Two times a day (BID) | INTRAVENOUS | Status: DC

## 2019-11-09 MED ORDER — SODIUM CHLORIDE 0.9 % IV SOLN: 250.0000 mL | INTRAVENOUS | Status: DC | PRN

## 2019-11-09 MED ORDER — FENTANYL CITRATE (PF) 100 MCG/2ML IJ SOLN
INTRAMUSCULAR | Status: DC | PRN
  Administered 2019-11-09: 25 ug via INTRAVENOUS

## 2019-11-09 MED ORDER — HEPARIN SODIUM (PORCINE) 1000 UNIT/ML IJ SOLN
INTRAMUSCULAR | Status: AC
  Filled 2019-11-09: qty 1

## 2019-11-09 MED ORDER — HYDRALAZINE HCL 20 MG/ML IJ SOLN: 5.0000 mg | INTRAMUSCULAR | Status: DC | PRN

## 2019-11-09 MED ORDER — ASPIRIN 81 MG PO CHEW: 81.0000 mg | CHEWABLE_TABLET | ORAL | Status: DC

## 2019-11-09 MED ORDER — HEPARIN (PORCINE) IN NACL 1000-0.9 UT/500ML-% IV SOLN
INTRAVENOUS | Status: DC | PRN
  Administered 2019-11-09 (×2): 500 mL

## 2019-11-09 MED ORDER — SODIUM CHLORIDE 0.9 % WEIGHT BASED INFUSION
3.0000 mL/kg/h | INTRAVENOUS | Status: AC
  Administered 2019-11-09: 3 mL/kg/h via INTRAVENOUS

## 2019-11-09 MED ORDER — IODIXANOL 320 MG/ML IV SOLN
INTRAVENOUS | Status: DC | PRN
  Administered 2019-11-09: 50 mL

## 2019-11-09 MED ORDER — HEPARIN (PORCINE) IN NACL 1000-0.9 UT/500ML-% IV SOLN
INTRAVENOUS | Status: AC
  Filled 2019-11-09: qty 1000

## 2019-11-09 MED ORDER — LABETALOL HCL 5 MG/ML IV SOLN: 10.0000 mg | INTRAVENOUS | Status: DC | PRN

## 2019-11-09 MED ORDER — SODIUM CHLORIDE 0.9 % IV SOLN: INTRAVENOUS | Status: AC

## 2019-11-09 MED ORDER — MIDAZOLAM HCL 2 MG/2ML IJ SOLN
INTRAMUSCULAR | Status: DC | PRN
  Administered 2019-11-09: 1 mg via INTRAVENOUS

## 2019-11-09 MED ORDER — LIDOCAINE HCL (PF) 1 % IJ SOLN
INTRAMUSCULAR | Status: AC
  Filled 2019-11-09: qty 30

## 2019-11-09 MED ORDER — LIDOCAINE HCL (PF) 1 % IJ SOLN
INTRAMUSCULAR | Status: DC | PRN
  Administered 2019-11-09: 30 mL

## 2019-11-09 MED ORDER — SODIUM CHLORIDE 0.9 % WEIGHT BASED INFUSION: 1.0000 mL/kg/h | INTRAVENOUS | Status: DC

## 2019-11-09 MED ORDER — SODIUM CHLORIDE 0.9% FLUSH: 3.0000 mL | INTRAVENOUS | Status: DC | PRN

## 2019-11-09 MED ORDER — FENTANYL CITRATE (PF) 100 MCG/2ML IJ SOLN
INTRAMUSCULAR | Status: AC
  Filled 2019-11-09: qty 2

## 2019-11-09 MED ORDER — MIDAZOLAM HCL 2 MG/2ML IJ SOLN
INTRAMUSCULAR | Status: AC
  Filled 2019-11-09: qty 2

## 2019-11-09 MED ORDER — ACETAMINOPHEN 325 MG PO TABS: 650.0000 mg | ORAL_TABLET | ORAL | Status: DC | PRN

## 2019-11-09 MED ORDER — HEPARIN SODIUM (PORCINE) 1000 UNIT/ML IJ SOLN
INTRAMUSCULAR | Status: DC | PRN
  Administered 2019-11-09: 10000 [IU] via INTRAVENOUS
  Administered 2019-11-09: 3000 [IU] via INTRAVENOUS

## 2019-11-09 SURGICAL SUPPLY — 21 items
CATH ANGIO 5F PIGTAIL 65CM (CATHETERS) ×3 IMPLANT
CATH CROSS OVER TEMPO 5F (CATHETERS) ×3 IMPLANT
CATH SHOCKWAVE 5.5X60 (CATHETERS) ×3 IMPLANT
CLOSURE MYNX CONTROL 6F/7F (Vascular Products) ×3 IMPLANT
DCB RANGER 5.0X80 135 (BALLOONS) ×2 IMPLANT
KIT ENCORE 26 ADVANTAGE (KITS) ×3 IMPLANT
KIT PV (KITS) ×3 IMPLANT
RANGER DCB 5.0X80 135 (BALLOONS) ×3
SHEATH HIGHFLEX ANSEL 6FRX55 (SHEATH) ×3 IMPLANT
SHEATH PINNACLE 5F 10CM (SHEATH) ×3 IMPLANT
SHEATH PINNACLE 6F 10CM (SHEATH) ×3 IMPLANT
SHEATH PROBE COVER 6X72 (BAG) ×3 IMPLANT
STOPCOCK MORSE 400PSI 3WAY (MISCELLANEOUS) ×3 IMPLANT
SYR MEDRAD MARK 7 150ML (SYRINGE) ×3 IMPLANT
TAPE VIPERTRACK RADIOPAQ (MISCELLANEOUS) ×2 IMPLANT
TAPE VIPERTRACK RADIOPAQUE (MISCELLANEOUS) ×1
TRANSDUCER W/STOPCOCK (MISCELLANEOUS) ×3 IMPLANT
TRAY PV CATH (CUSTOM PROCEDURE TRAY) ×3 IMPLANT
TUBING CIL FLEX 10 FLL-RA (TUBING) ×3 IMPLANT
WIRE HITORQ VERSACORE ST 145CM (WIRE) ×3 IMPLANT
WIRE ZILIENT 014 4G (WIRE) ×3 IMPLANT

## 2019-11-09 NOTE — Interval H&P Note (Signed)
History and Physical Interval Note:  11/09/2019 8:55 AM  Jeffrey Winters  has presented today for surgery, with the diagnosis of Peripheral vascular disease.  The various methods of treatment have been discussed with the patient and family. After consideration of risks, benefits and other options for treatment, the patient has consented to  Procedure(s): ABDOMINAL AORTOGRAM W/LOWER EXTREMITY (N/A) as a surgical intervention.  The patient's history has been reviewed, patient examined, no change in status, stable for surgery.  I have reviewed the patient's chart and labs.  Questions were answered to the patient's satisfaction.     Quay Burow

## 2019-11-09 NOTE — Discharge Instructions (Signed)
Femoral Site Care This sheet gives you information about how to care for yourself after your procedure. Your health care provider may also give you more specific instructions. If you have problems or questions, contact your health care provider. What can I expect after the procedure? After the procedure, it is common to have:  Bruising that usually fades within 1-2 weeks.  Tenderness at the site. Follow these instructions at home: Wound care  Follow instructions from your health care provider about how to take care of your insertion site. Make sure you: ? Wash your hands with soap and water before you change your bandage (dressing). If soap and water are not available, use hand sanitizer. ? Change your dressing as told by your health care provider. ? Leave stitches (sutures), skin glue, or adhesive strips in place. These skin closures may need to stay in place for 2 weeks or longer. If adhesive strip edges start to loosen and curl up, you may trim the loose edges. Do not remove adhesive strips completely unless your health care provider tells you to do that.  Do not take baths, swim, or use a hot tub until your health care provider approves.  You may shower 24-48 hours after the procedure or as told by your health care provider. ? Gently wash the site with plain soap and water. ? Pat the area dry with a clean towel. ? Do not rub the site. This may cause bleeding.  Do not apply powder or lotion to the site. Keep the site clean and dry.  Check your femoral site every day for signs of infection. Check for: ? Redness, swelling, or pain. ? Fluid or blood. ? Warmth. ? Pus or a bad smell. Activity  For the first 2-3 days after your procedure, or as long as directed: ? Avoid climbing stairs as much as possible. ? Do not squat.  Do not lift anything that is heavier than 10 lb (4.5 kg), or the limit that you are told, until your health care provider says that it is safe.  Rest as  directed. ? Avoid sitting for a long time without moving. Get up to take short walks every 1-2 hours.  Do not drive for 24 hours if you were given a medicine to help you relax (sedative). General instructions  Take over-the-counter and prescription medicines only as told by your health care provider.  Keep all follow-up visits as told by your health care provider. This is important. Contact a health care provider if you have:  A fever or chills.  You have redness, swelling, or pain around your insertion site. Get help right away if:  The catheter insertion area swells very fast.  You pass out.  You suddenly start to sweat or your skin gets clammy.  The catheter insertion area is bleeding, and the bleeding does not stop when you hold steady pressure on the area.  The area near or just beyond the catheter insertion site becomes pale, cool, tingly, or numb. These symptoms may represent a serious problem that is an emergency. Do not wait to see if the symptoms will go away. Get medical help right away. Call your local emergency services (911 in the [place] Do not drive yourself to the hospital. Summary  After the procedure, it is common to have bruising that usually fades within 1-2 weeks.  Check your femoral site every day for signs of infection.  Do not lift anything that is heavier than 10 lb (4.5 kg), or the   limit that you are told, until your health care provider says that it is safe. This information is not intended to replace advice given to you by your health care provider. Make sure you discuss any questions you have with your health care provider. Document Revised: 08/02/2017 Document Reviewed: 08/02/2017 Elsevier Patient Education  2020 [employer]  

## 2019-11-09 NOTE — Progress Notes (Signed)
Discharge instructions reviewed with patient and family. Verbalized understanding. 

## 2019-11-16 ENCOUNTER — Other Ambulatory Visit (HOSPITAL_COMMUNITY): Payer: Self-pay | Admitting: Cardiovascular Disease

## 2019-11-16 ENCOUNTER — Other Ambulatory Visit: Payer: Self-pay

## 2019-11-16 ENCOUNTER — Ambulatory Visit (HOSPITAL_COMMUNITY)
Admission: RE | Admit: 2019-11-16 | Discharge: 2019-11-16 | Disposition: A | Discharge status: Home / Self Care | Payer: Self-pay | Source: Ambulatory Visit | Attending: Cardiology | Admitting: Cardiology

## 2019-11-16 DIAGNOSIS — I739 Peripheral vascular disease, unspecified: Secondary | ICD-10-CM | POA: Insufficient documentation

## 2019-11-21 ENCOUNTER — Other Ambulatory Visit (HOSPITAL_COMMUNITY): Payer: Self-pay | Admitting: Cardiovascular Disease

## 2019-11-21 DIAGNOSIS — Z9862 Peripheral vascular angioplasty status: Secondary | ICD-10-CM

## 2019-11-21 DIAGNOSIS — I739 Peripheral vascular disease, unspecified: Secondary | ICD-10-CM

## 2019-11-24 ENCOUNTER — Other Ambulatory Visit: Payer: Self-pay

## 2019-11-24 ENCOUNTER — Encounter: Payer: Self-pay | Admitting: Cardiovascular Disease

## 2019-11-24 ENCOUNTER — Ambulatory Visit (INDEPENDENT_AMBULATORY_CARE_PROVIDER_SITE_OTHER): Payer: Self-pay | Admitting: Cardiovascular Disease

## 2019-11-24 VITALS — BP 138/82 | HR 54 | Ht 66.0 in | Wt 225.4 lb

## 2019-11-24 DIAGNOSIS — R7989 Other specified abnormal findings of blood chemistry: Secondary | ICD-10-CM

## 2019-11-24 DIAGNOSIS — I739 Peripheral vascular disease, unspecified: Secondary | ICD-10-CM

## 2019-11-24 NOTE — Progress Notes (Signed)
11/24/2019 Jeffrey Winters   08-14-1965  [id number]  Primary Physician Lorretta Harp, MD Primary Cardiologist: Lorretta Harp MD FACP, [place], [place], [place]  HPI:  Jeffrey Winters is a 54 y.o.  mild to moderately overweight single African-American male with no children referred to me by [name], PA-C for treatment of symptomatic PAD. He does not have a primary care provider.  I last saw him in the office 10/24/2019. 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 There is no family history of heart disease. He is never had a stroke. He had a non-STEMI in December and had treatment of his distal RCA. He had stenting of his distal RCA and obtuse marginal branch at [facility] 02/26/2016 as well. He does complain of left Solomon claudication at 50 to 75 feet which is symmetric bilaterally. He had Doppler studies performed 08/09/2019 revealing normal ABIs with moderate disease in both SFAs.  I performed peripheral angiography on him 10/05/2019 revealing high-grade fairly focal mid bilateral SFA stenoses.  I performed [facility] 1 directional atherectomy followed by drug-coated balloon angioplasty of the mid left SFA with an excellent result.  His Dopplers normalized and his left calf claudication has resolved.   I performed right SFA intervention on him 11/09/2019 using intravascular lithotripsy followed by drug-coated balloon angioplasty.  His follow-up Doppler studies performed 11/16/2019 showed no change in the frequency mid right SFA or change in his ABIs although clinically he is improved.  Unfortunately, he does continue to smoke a half a pack a day.  His lipid profile is excellent.   Current Meds  Medication Sig  . aspirin 81 MG chewable tablet Chew 1 tablet (81 mg total) by  mouth daily.  Marland Kitchen atorvastatin (LIPITOR) 80 MG tablet Take 1 tablet (80 mg total) by mouth daily at 6 PM.  . famotidine (PEPCID) 20 MG tablet Take 1 tablet (20 mg total) by mouth 2 (two) times daily.  . isosorbide mononitrate (IMDUR) 30 MG 24 hr tablet Take 0.5 tablets (15 mg total) by mouth daily.  . metoprolol succinate (TOPROL XL) 25 MG 24 hr tablet Take 1 tablet by mouth once a day, may take an additional tablet as needed for palpitations  . nitroGLYCERIN (NITROSTAT) 0.4 MG SL tablet Place 1 tablet (0.4 mg total) under the tongue every 5 (five) minutes as needed.  . prasugrel (EFFIENT) 10 MG TABS tablet Take 1 tablet (10 mg total) by mouth daily.     No Known Allergies  Social History   Socioeconomic History  . Marital status: Single    Spouse name: Not on file  . Number of children: Not on file  . Years of education: Not on file  . Highest education level: Not on file  Occupational History  . Not on file  Tobacco Use  . Smoking status: Current Every Day Smoker  . Smokeless tobacco: Never Used  . Tobacco comment: 4 cigs/day  Substance and Sexual Activity  . Alcohol use: Not Currently  . Drug use: No  . Sexual activity: Not on file  Other Topics Concern  . Not on file  Social History Narrative  . Not on file   Social Determinants of Health   Financial Resource Strain:   . Difficulty of Paying Living Expenses:  Food Insecurity:   . Worried About Charity fundraiser in the Last Year:   . Arboriculturist in the Last Year:   Transportation Needs:   . Film/video editor (Medical):   Marland Kitchen Lack of Transportation (Non-Medical):   Physical Activity:   . Days of Exercise per Week:   . Minutes of Exercise per Session:   Stress:   . Feeling of Stress :   Social Connections:   . Frequency of Communication with Friends and Family:   . Frequency of Social Gatherings with Friends and Family:   . Attends Religious Services:   . Active Member of Clubs or Organizations:   .  Attends Archivist Meetings:   Marland Kitchen Marital Status:   Intimate Partner Violence:   . Fear of Current or Ex-Partner:   . Emotionally Abused:   Marland Kitchen Physically Abused:   . Sexually Abused:      Review of Systems: General: negative for chills, fever, night sweats or weight changes.  Cardiovascular: negative for chest pain, dyspnea on exertion, edema, orthopnea, palpitations, paroxysmal nocturnal dyspnea or shortness of breath Dermatological: negative for rash Respiratory: negative for cough or wheezing Urologic: negative for hematuria Abdominal: negative for nausea, vomiting, diarrhea, bright red blood per rectum, melena, or hematemesis Neurologic: negative for visual changes, syncope, or dizziness All other systems reviewed and are otherwise negative except as noted above.    Blood pressure 138/82, pulse (!) 54, height 5\' 6"  (1.676 m), weight 225 lb 6.4 oz (102.2 kg), SpO2 98 %.  General appearance: alert and no distress Neck: no adenopathy, no carotid bruit, no JVD, supple, symmetrical, trachea midline and thyroid not enlarged, symmetric, no tenderness/mass/nodules Lungs: clear to auscultation bilaterally Heart: regular rate and rhythm, S1, S2 normal, no murmur, click, rub or gallop Extremities: extremities normal, atraumatic, no cyanosis or edema Pulses: 2+ and symmetric Skin: Skin color, texture, turgor normal. No rashes or lesions Neurologic: Alert and oriented X 3, normal strength and tone. Normal symmetric reflexes. Normal coordination and gait  EKG not performed today  ASSESSMENT AND PLAN:   PAD (peripheral artery disease) ([place]) History of peripheral arterial disease referred to me by [name], PA-C, primary cardiology patient of Dr. Antionette Char.  He underwent [place] 1 directional atherectomy followed by drug-coated balloon angioplasty on 10/05/2019 with staged right SFA intervention by myself 11/09/2019 using intravascular lithotripsy/shockwave with drug-coated balloon  angioplasty.  Symptoms of claudication have resolved.  Is post intervention Dopplers however did show continued elevated velocities in the site of intervention on the right side with unchanged ABIs.  We will recheck Doppler studies on him in 6 months.  He does continue to smoke unfortunately.      Lorretta Harp MD FACP,FACC,FAHA, [facility] 11/24/2019 9:28 AM

## 2019-11-24 NOTE — Assessment & Plan Note (Signed)
History of peripheral arterial disease referred to me by [name], PA-C, primary cardiology patient of Dr. Antionette Char.  He underwent [place] 1 directional atherectomy followed by drug-coated balloon angioplasty on 10/05/2019 with staged right SFA intervention by myself 11/09/2019 using intravascular lithotripsy/shockwave with drug-coated balloon angioplasty.  Symptoms of claudication have resolved.  Is post intervention Dopplers however did show continued elevated velocities in the site of intervention on the right side with unchanged ABIs.  We will recheck Doppler studies on him in 6 months.  He does continue to smoke unfortunately.

## 2019-11-24 NOTE — Patient Instructions (Signed)
Medication Instructions:  Your physician recommends that you continue on your current medications as directed. Please refer to the Current Medication list given to you today.  *If you need a refill on your cardiac medications before your next appointment, please call your pharmacy*   Lab Work: Return for lab work in [place] (June)-liver function  If you have labs (blood work) drawn today and your tests are completely normal, you will receive your results only by: Marland Kitchen MyChart Message (if you have MyChart) OR . A paper copy in the mail If you have any lab test that is abnormal or we need to change your treatment, we will call you to review the results.   Testing/Procedures: Your physician has requested that you have a lower extremity arterial duplex in October 2021. This test is an ultrasound of the arteries in the legs. It looks at arterial blood flow in the legs. Allow one hour for Lower Arterial scans. There are no restrictions or special instructions  Follow-Up: At [facility], you and your health needs are our priority.  As part of our continuing mission to provide you with exceptional heart care, we have created designated Provider Care Teams.  These Care Teams include your primary Cardiologist (physician) and Advanced Practice Providers (APPs -  Physician Assistants and Nurse Practitioners) who all work together to provide you with the care you need, when you need it.  We recommend signing up for the patient portal called "MyChart".  Sign up information is provided on this After Visit Summary.  MyChart is used to connect with patients for Virtual Visits (Telemedicine).  Patients are able to view lab/test results, encounter notes, upcoming appointments, etc.  Non-urgent messages can be sent to your provider as well.   To learn more about what you can do with MyChart, go to [email or web address].    Your next appointment:   12 month(s)  The format for your next appointment:   In  Person  Provider:   Quay Burow, MD

## 2019-12-04 MED FILL — ATORVASTATIN 80 MG TABLET: 80 | 30 days supply | Qty: 30 | Fill #2

## 2019-12-04 MED FILL — PRASUGREL HCL 10 MG TABS: 10 | 30 days supply | Qty: 30 | Fill #3

## 2019-12-04 MED FILL — ISOSORBIDE MN ER 30 MG TAB: 30 | 30 days supply | Qty: 15 | Fill #3

## 2020-01-08 ENCOUNTER — Other Ambulatory Visit: Payer: Self-pay | Admitting: Cardiovascular Disease

## 2020-03-12 ENCOUNTER — Other Ambulatory Visit: Payer: Self-pay | Admitting: Internal Medicine

## 2020-03-12 ENCOUNTER — Encounter: Payer: Self-pay | Admitting: Internal Medicine

## 2020-03-12 ENCOUNTER — Other Ambulatory Visit: Payer: Self-pay

## 2020-03-12 ENCOUNTER — Telehealth (INDEPENDENT_AMBULATORY_CARE_PROVIDER_SITE_OTHER): Payer: Self-pay | Admitting: Internal Medicine

## 2020-03-12 DIAGNOSIS — K219 Gastro-esophageal reflux disease without esophagitis: Secondary | ICD-10-CM

## 2020-03-12 MED ORDER — PANTOPRAZOLE SODIUM 40 MG PO TBEC: 40.0000 mg | DELAYED_RELEASE_TABLET | Freq: Every day | ORAL | 1 refills | Status: DC

## 2020-03-12 MED ORDER — PRASUGREL HCL 10 MG PO TABS: 10.0000 mg | ORAL_TABLET | Freq: Every day | ORAL | 3 refills | Status: DC

## 2020-03-12 NOTE — Progress Notes (Signed)
Virtual Visit via Telephone Note  I connected with Jeffrey Winters, on 03/12/2020 at 3:49 PM by telephone due to the COVID-19 pandemic and verified that I am speaking with the correct person using two identifiers.   Consent: I discussed the limitations, risks, security and privacy concerns of performing an evaluation and management service by telephone and the availability of in person appointments. I also discussed with the patient that there may be a patient responsible charge related to this service. The patient expressed understanding and agreed to proceed.   Location of Patient: Home   Location of Provider: Clinic    Persons participating in Telemedicine visit: Jesper Stirewalt [facility] Dr. Juleen China      History of Present Illness: Patient has a visit to follow up on GERD. Reports that he has had significant increase in his reflux like symptoms---lots of burning in chest, bitter taste in mouth, burping, gas pain, reflux of food. Taking Pepcid 20 mg BID. No recent diet changes. Has never had endoscopy.    Past Medical History:  Diagnosis Date   Coronary artery disease 2017   MI s/p PCI to OM2 // s/p MI 07/2019 >> DES to RCA; residual RPL2 80 // Myoview 08/2019: EF 61, small area of mod ischemia inf base; Low Risk   Hypertension    PAD (peripheral artery disease) ([place])    LE [place] 08/2019: R SFA 50-74; L SFA 50-74   No Known Allergies  Current Outpatient Medications on File Prior to Visit  Medication Sig Dispense Refill   aspirin 81 MG chewable tablet Chew 1 tablet (81 mg total) by mouth daily. 90 tablet 1   atorvastatin (LIPITOR) 80 MG tablet Take 1 tablet (80 mg total) by mouth daily at 6 PM. 90 tablet 3   famotidine (PEPCID) 20 MG tablet Take 1 tablet (20 mg total) by mouth 2 (two) times daily. 180 tablet 1   isosorbide mononitrate (IMDUR) 30 MG 24 hr tablet Take 0.5 tablets (15 mg total) by mouth daily. 45 tablet 1   metoprolol succinate (TOPROL XL) 25 MG  24 hr tablet Take 1 tablet by mouth once a day, may take an additional tablet as needed for palpitations 120 tablet 3   nitroGLYCERIN (NITROSTAT) 0.4 MG SL tablet Place 1 tablet (0.4 mg total) under the tongue every 5 (five) minutes as needed. 25 tablet 2   prasugrel (EFFIENT) 10 MG TABS tablet Take 1 tablet (10 mg total) by mouth daily. 90 tablet 3   No current facility-administered medications on file prior to visit.    Observations/Objective: NAD. Speaking clearly.  Work of breathing normal.  Alert and oriented. Mood appropriate.   Assessment and Plan: 1. Gastroesophageal reflux disease without esophagitis Will have patient return for H. Pylori testing. Do not initiate PPI until after test has been completed. If positive, will treat appropriately, If negative, d/c Pepcid and trial Protonix. If no improvement with PPI, will refer to GI. Discussed avoidance of dietary triggers including caffeine, alcohol, citrus/other acidic foods, greasy and fatty foods.  - H. pylori breath test; Future - pantoprazole (PROTONIX) 40 MG tablet; Take 1 tablet (40 mg total) by mouth daily.  Dispense: 30 tablet; Refill: 1   Follow Up Instructions: Lab visit 8/16    I discussed the assessment and treatment plan with the patient. The patient was provided an opportunity to ask questions and all were answered. The patient agreed with the plan and demonstrated an understanding of the instructions.   The  patient was advised to call back or seek an in-person evaluation if the symptoms worsen or if the condition fails to improve as anticipated.     I provided 19 minutes total of non-face-to-face time during this encounter including median intraservice time, reviewing previous notes, investigations, ordering medications, medical decision making, coordinating care and patient verbalized understanding at the end of the visit.    Phill Myron, D.O. Primary Care at [facility]  03/12/2020, 3:49 PM

## 2020-03-13 ENCOUNTER — Ambulatory Visit: Payer: Self-pay | Admitting: Internal Medicine

## 2020-03-13 MED FILL — PANTOPRAZOLE SOD DR 40 MG T: 40 | 30 days supply | Qty: 30 | Fill #0

## 2020-03-13 MED FILL — PRASUGREL HCL 10 MG TABS: 10 | 30 days supply | Qty: 30 | Fill #0

## 2020-03-15 MED FILL — ISOSORBIDE MN ER 30 MG TAB: 30 | 30 days supply | Qty: 15 | Fill #0

## 2020-03-15 MED FILL — METOPROLOL SUCCINATE ER 25: 25 | 30 days supply | Qty: 60 | Fill #0

## 2020-03-15 MED FILL — ATORVASTATIN 80 MG TABLET: 80 | 30 days supply | Qty: 30 | Fill #0

## 2020-03-18 ENCOUNTER — Other Ambulatory Visit: Payer: Self-pay

## 2020-03-18 DIAGNOSIS — K219 Gastro-esophageal reflux disease without esophagitis: Secondary | ICD-10-CM

## 2020-03-18 NOTE — Progress Notes (Signed)
Patient here for H. Pylori breath test. Laurance Flatten, CMA.

## 2020-03-19 LAB — H. PYLORI BREATH TEST: H pylori Breath Test: NEGATIVE

## 2020-03-19 NOTE — Progress Notes (Signed)
Normal lab letter mailed.

## 2020-04-16 ENCOUNTER — Ambulatory Visit: Payer: Self-pay | Admitting: Physician Assistant

## 2020-04-22 ENCOUNTER — Other Ambulatory Visit: Payer: Self-pay | Admitting: Physician Assistant

## 2020-04-22 MED FILL — PRASUGREL HCL 10 MG TABS: 10 | 30 days supply | Qty: 30 | Fill #1

## 2020-04-22 MED FILL — METOPROLOL SUCCINATE ER 25: 25 | 30 days supply | Qty: 60 | Fill #1

## 2020-04-22 MED FILL — ATORVASTATIN CALCIUM 80 MG: 80 | 30 days supply | Qty: 30 | Fill #1

## 2020-04-22 MED FILL — ISOSORBIDE MN ER 30 MG TAB: 30 | 30 days supply | Qty: 15 | Fill #0

## 2020-05-13 NOTE — Progress Notes (Signed)
Cardiology Office Note:    Date:  05/14/2020   ID:  [name], DOB 12-Dec-1965, MRN [id number]  PCP:  Lorretta Harp, MD  [facility] Cardiologist:  Sherren Mocha, MD   [facility] Electrophysiologist:  None  PV: Quay Burow, MD  Referring MD: Lorretta Harp, MD   Chief Complaint:  Follow-up (CAD)    Patient Profile:    BOHDI LEEDS is a 54 y.o. male with:   Coronary artery disease ? S/p NSTEMI in 02/2016 tx with Xience DES to OM1 ? s/p NSTEMI in 05/2016 ? S/p Inf STEMI 07/2019 >> s/p DES to RCA; OM1 stent patent  Residual RPL2 80% >> Med Rx  Myoview 08/2019: small inf ischemia; low risk >> med Rx continued  Peripheral Arterial Disease   ? S/p L SFA PTA 10/2019 ? S/p R SFA PTA 11/2019  Hypertension   Hyperlipidemia   Tobacco use  Prior CV studies: Myoview 08/25/2019 EF 61, small area of inferior ischemia; low risk  LE Art [place] 08/2019:  R SFA 50-74; L SFA 50-74  Echocardiogram 07/11/2019 EF 55, mild LVH, inf HK, Gr 2 DD, normal RVSF, trace MR, trivial TR, RVSP 36.3  Cardiac catheterization 07/10/2019 LAD mid 55, dist 40 LCx irregs; OM2 stent patent RCA mid 100, dist 50; RPL2 80 PCI: 3 x 38 mm Resolute Onyx DES to mid RCA  Cardiac catheterization 02/26/16 ([place]) 2. 2.5 x 18 mm Xience drug eluting stent to the OM2   History of Present Illness:    Mr. Mackie was last seen in March 2021.  He has had symptoms of palpitations.  An event monitor has been ordered but never performed.  Since last seen, he underwent balloon angioplasty to the right SFA by Dr. Gwenlyn Found in April 2021.  He returns for follow-up.  He is here alone.  He continues to note issues with shortness of breath with exertion as well as being tired.  He is also had some left knee pain and swelling.  He really has not had recurrent anginal symptoms.  He has not had syncope.  He has not had orthopnea.  He continues to smoke half a pack per day.      Past Medical History:   Diagnosis Date  . Coronary artery disease 2017   MI s/p PCI to OM2 // s/p MI 07/2019 >> DES to RCA; residual RPL2 80 // Myoview 08/2019: EF 61, small area of mod ischemia inf base; Low Risk  . Hypertension   . PAD (peripheral artery disease) ([place])    LE Art [place] 08/2019: R SFA 50-74; L SFA 50-74    Current Medications: Current Meds  Medication Sig  . aspirin 81 MG chewable tablet Chew 1 tablet (81 mg total) by mouth daily.  Marland Kitchen atorvastatin (LIPITOR) 80 MG tablet Take 1 tablet (80 mg total) by mouth daily at 6 PM.  . famotidine (PEPCID) 20 MG tablet Take 1 tablet (20 mg total) by mouth 2 (two) times daily.  . isosorbide mononitrate (IMDUR) 30 MG 24 hr tablet TAKE 1/2 TABLET BY MOUTH DAILY.  . metoprolol succinate (TOPROL XL) 25 MG 24 hr tablet Take 1 tablet by mouth once a day, may take an additional tablet as needed for palpitations  . nitroGLYCERIN (NITROSTAT) 0.4 MG SL tablet Place 1 tablet (0.4 mg total) under the tongue every 5 (five) minutes as needed.  . prasugrel (EFFIENT) 10 MG TABS tablet Take 1 tablet (10 mg total) by mouth daily.  Allergies:   Patient has no known allergies.   Social History   Tobacco Use  . Smoking status: Current Every Day Smoker  . Smokeless tobacco: Never Used  . Tobacco comment: 4 cigs/day  Substance Use Topics  . Alcohol use: Not Currently  . Drug use: No     Family Hx: The patient's family history includes Heart disease in his father and mother.  ROS See HPI  EKGs/Labs/Other Test Reviewed:    EKG:  EKG is   ordered today.  The ekg ordered today demonstrates normal sinus rhythm, heart rate 60, normal axis, inferior Q waves, QTC 396, no change from prior tracing  Recent Labs: 07/21/2019: NT-Pro BNP 233 08/15/2019: Magnesium 1.9; TSH 1.810 11/07/2019: ALT 20; BUN 14; Creatinine, Ser 1.04; Hemoglobin 14.2; Platelets 178; Potassium 4.7; Sodium 142   Recent Lipid Panel Lab Results  Component Value Date/Time   CHOL 115 11/07/2019 02:32 PM    TRIG 117 11/07/2019 02:32 PM   HDL 35 (L) 11/07/2019 02:32 PM   CHOLHDL 3.3 11/07/2019 02:32 PM   CHOLHDL 6.3 07/10/2019 02:00 PM   [place] 59 11/07/2019 02:32 PM    Physical Exam:    VS:  BP 120/68   Pulse 60   Ht 5\' 6"  (1.676 m)   Wt 224 lb 3.2 oz (101.7 kg)   SpO2 98%   BMI 36.19 kg/m     Wt Readings from Last 3 Encounters:  05/14/20 224 lb 3.2 oz (101.7 kg)  11/24/19 225 lb 6.4 oz (102.2 kg)  11/09/19 225 lb (102.1 kg)     Constitutional:      Appearance: Healthy appearance. Not in distress.  Neck:     Thyroid: No thyromegaly.     Vascular: JVD normal.  Pulmonary:     Effort: Pulmonary effort is normal.     Breath sounds: Diffuse Wheezing (inspiratory) present. No rales.  Cardiovascular:     Normal rate. Regular rhythm. Normal S1. Normal S2.     Murmurs: There is no murmur.  Edema:    Pretibial: bilateral trace edema of the pretibial area. Abdominal:     Palpations: Abdomen is soft. There is no hepatomegaly.  Skin:    General: Skin is warm and dry.  Neurological:     General: No focal deficit present.     Mental Status: Alert and oriented to person, place and time.     Cranial Nerves: Cranial nerves are intact.      ASSESSMENT & PLAN:    1. Coronary artery disease involving native coronary artery of native heart with angina pectoris [facility]) s/p myocardial infarction in 2017 treated with DES to the OM1 at [facility].  He is status post inferior STEMI in 07/2019 treated with a DES to the RCA.  He has residual stenosis with 80% in the RPL 2.  Myoview in 08/2019 demonstrated a small amount of inferior ischemia.  He is managed medically.  He is not really having recurrent angina.  Continue Prasugrel, ASA, Atorvastatin, Metoprolol and Isosorbide.    2. PAD (peripheral artery disease) ([facility]) No claudication.  FU with Dr. Gwenlyn Found as planned.   3. Essential hypertension hypertension   4. Mixed hyperlipidemia LDL optimal on most recent lab work.  Continue current  Rx.    5. Chronic obstructive pulmonary disease, unspecified COPD type ([place]) He has a long history of smoking and previously smoked a pack a day.  He is currently smoking half a pack per day.  He notes shortness of  breath with exertion as well as being tired.  He has significant wheezing on exam.  I suspect he has COPD.  I recommend he quit smoking.  I will give him a prescription for albuterol to use as needed for now.  I will also refer him to pulmonology for further evaluation and management.  6. Chronic pain of left knee He may have arthritis.  He does not see a primary care physician.  I have asked him to get established with someone at [facility] for evaluation.  7. Tobacco abuse As noted, I have recommended smoking cessation.  We discussed the increased risk of recurrent MI should he continue to smoke.    Dispo:  Return in about 6 months (around 11/12/2020) for Routine Follow Up, w/ [name], in person.   Medication Adjustments/Labs and Tests Ordered: Current medicines are reviewed at length with the patient today.  Concerns regarding medicines are outlined above.  Tests Ordered: Orders Placed This Encounter  Procedures  . Ambulatory referral to Pulmonology  . Ambulatory referral to Internal Medicine  . EKG 12-Lead   Medication Changes: Meds ordered this encounter  Medications  . albuterol (VENTOLIN HFA) 108 (90 Base) MCG/ACT inhaler    Sig: Inhale 1-2 puffs into the lungs every 6 (six) hours as needed for wheezing or shortness of breath.    Dispense:  8 g    Refill:  2    Signed, [name], PA-C  05/14/2020 8:52 AM    [facility] [place], [place], [place]  [address] Phone: [phone]; Fax: [phone]

## 2020-05-14 ENCOUNTER — Other Ambulatory Visit: Payer: Self-pay

## 2020-05-14 ENCOUNTER — Other Ambulatory Visit: Payer: Self-pay | Admitting: Physician Assistant

## 2020-05-14 ENCOUNTER — Ambulatory Visit (INDEPENDENT_AMBULATORY_CARE_PROVIDER_SITE_OTHER): Payer: Self-pay | Admitting: Physician Assistant

## 2020-05-14 ENCOUNTER — Encounter: Payer: Self-pay | Admitting: Physician Assistant

## 2020-05-14 VITALS — BP 120/68 | HR 60 | Ht 66.0 in | Wt 224.2 lb

## 2020-05-14 DIAGNOSIS — M25562 Pain in left knee: Secondary | ICD-10-CM

## 2020-05-14 DIAGNOSIS — I739 Peripheral vascular disease, unspecified: Secondary | ICD-10-CM

## 2020-05-14 DIAGNOSIS — G8929 Other chronic pain: Secondary | ICD-10-CM

## 2020-05-14 DIAGNOSIS — I10 Essential (primary) hypertension: Secondary | ICD-10-CM

## 2020-05-14 DIAGNOSIS — J449 Chronic obstructive pulmonary disease, unspecified: Secondary | ICD-10-CM

## 2020-05-14 DIAGNOSIS — E782 Mixed hyperlipidemia: Secondary | ICD-10-CM

## 2020-05-14 DIAGNOSIS — Z720 Tobacco use: Secondary | ICD-10-CM

## 2020-05-14 DIAGNOSIS — I25119 Atherosclerotic heart disease of native coronary artery with unspecified angina pectoris: Secondary | ICD-10-CM

## 2020-05-14 MED ORDER — ALBUTEROL SULFATE HFA 108 (90 BASE) MCG/ACT IN AERS: 1.0000 | INHALATION_SPRAY | Freq: Four times a day (QID) | RESPIRATORY_TRACT | 2 refills | Status: DC | PRN

## 2020-05-14 MED FILL — ALBUTEROL SULFATE HFA 108 (: 108 (90 BAS | 25 days supply | Qty: 18 | Fill #0

## 2020-05-14 NOTE — Patient Instructions (Signed)
Medication Instructions:  Your physician has recommended you make the following change in your medication:   1) Start Albuterol inhaler, 1-2 puffs every 4-6 hours as needed  *If you need a refill on your cardiac medications before your next appointment, please call your pharmacy*  Lab Work: None ordered today  Testing/Procedures: None ordered today  Follow-Up: At [facility], you and your health needs are our priority.  As part of our continuing mission to provide you with exceptional heart care, we have created designated Provider Care Teams.  These Care Teams include your primary Cardiologist (physician) and Advanced Practice Providers (APPs -  Physician Assistants and Nurse Practitioners) who all work together to provide you with the care you need, when you need it.  Your next appointment:   6 month(s)  The format for your next appointment:   In Person  Provider:   Sherren Mocha, MD

## 2020-05-17 ENCOUNTER — Other Ambulatory Visit: Payer: Self-pay | Admitting: Physician Assistant

## 2020-05-17 MED FILL — ATORVASTATIN CALCIUM 80 MG: 80 | 30 days supply | Qty: 30 | Fill #2

## 2020-05-17 MED FILL — PRASUGREL HCL 10 MG TABS: 10 | 30 days supply | Qty: 30 | Fill #2

## 2020-05-20 MED FILL — ISOSORBIDE MN ER 30 MG TAB: 30 | 30 days supply | Qty: 15 | Fill #0

## 2020-05-21 ENCOUNTER — Other Ambulatory Visit: Payer: Self-pay

## 2020-05-21 ENCOUNTER — Ambulatory Visit (HOSPITAL_COMMUNITY)
Admission: RE | Admit: 2020-05-21 | Discharge: 2020-05-21 | Disposition: A | Discharge status: Home / Self Care | Payer: Self-pay | Source: Ambulatory Visit | Attending: Cardiovascular Disease | Admitting: Cardiovascular Disease

## 2020-05-21 DIAGNOSIS — I739 Peripheral vascular disease, unspecified: Secondary | ICD-10-CM | POA: Insufficient documentation

## 2020-05-21 DIAGNOSIS — Z9862 Peripheral vascular angioplasty status: Secondary | ICD-10-CM | POA: Insufficient documentation

## 2020-05-29 ENCOUNTER — Other Ambulatory Visit: Payer: Self-pay

## 2020-05-29 ENCOUNTER — Ambulatory Visit (HOSPITAL_COMMUNITY)
Admission: RE | Admit: 2020-05-29 | Discharge: 2020-05-29 | Disposition: A | Discharge status: Home / Self Care | Payer: Self-pay | Source: Ambulatory Visit | Attending: Internal Medicine | Admitting: Internal Medicine

## 2020-05-29 ENCOUNTER — Encounter: Payer: Self-pay | Admitting: Internal Medicine

## 2020-05-29 ENCOUNTER — Ambulatory Visit: Payer: Self-pay | Attending: Family Medicine | Admitting: Internal Medicine

## 2020-05-29 VITALS — BP 112/67 | HR 89 | Resp 16 | Wt 223.6 lb

## 2020-05-29 DIAGNOSIS — Z2821 Immunization not carried out because of patient refusal: Secondary | ICD-10-CM

## 2020-05-29 DIAGNOSIS — M25562 Pain in left knee: Secondary | ICD-10-CM

## 2020-05-29 DIAGNOSIS — I739 Peripheral vascular disease, unspecified: Secondary | ICD-10-CM

## 2020-05-29 NOTE — Progress Notes (Signed)
CC knee pain  Pain duration 1 month. Pain increases when he tries to stand from sitting/lying position. He notes that when he sits for a prolonged period of time his leg will "go numb" (really means a profound tingling sensation, not lack of sensation). Has pain with walking. He localizes pain to left knee. This has not happened before.   Reviewed recent CV note and ED note from [facility] (odontogenic infection).  patinet was treated with amox/clav and nsaid.   Past Medical History:  Diagnosis Date  . Coronary artery disease 2017   MI s/p PCI to OM2 // s/p MI 07/2019 >> DES to RCA; residual RPL2 80 // Myoview 08/2019: EF 61, small area of mod ischemia inf base; Low Risk  . Hypertension   . PAD (peripheral artery disease) ([place])    LE Art [place] 08/2019: R SFA 50-74; L SFA 50-74    Social History   Socioeconomic History  . Marital status: Single    Spouse name: Not on file  . Number of children: Not on file  . Years of education: Not on file  . Highest education level: Not on file  Occupational History  . Not on file  Tobacco Use  . Smoking status: Current Every Day Smoker  . Smokeless tobacco: Never Used  . Tobacco comment: 4 cigs/day  Substance and Sexual Activity  . Alcohol use: Not Currently  . Drug use: No  . Sexual activity: Not on file  Other Topics Concern  . Not on file  Social History Narrative  . Not on file   Social Determinants of Health   Financial Resource Strain:   . Difficulty of Paying Living Expenses: Not on file  Food Insecurity:   . Worried About Charity fundraiser in the Last Year: Not on file  . Ran Out of Food in the Last Year: Not on file  Transportation Needs:   . Lack of Transportation (Medical): Not on file  . Lack of Transportation (Non-Medical): Not on file  Physical Activity:   . Days of Exercise per Week: Not on file  . Minutes of Exercise per Session: Not on file  Stress:   . Feeling of Stress : Not on file  Social Connections:   . Frequency  of Communication with Friends and Family: Not on file  . Frequency of Social Gatherings with Friends and Family: Not on file  . Attends Religious Services: Not on file  . Active Member of Clubs or Organizations: Not on file  . Attends Archivist Meetings: Not on file  . Marital Status: Not on file  Intimate Partner Violence:   . Fear of Current or Ex-Partner: Not on file  . Emotionally Abused: Not on file  . Physically Abused: Not on file  . Sexually Abused: Not on file    Past Surgical History:  Procedure Laterality Date  . ABDOMINAL AORTOGRAM W/LOWER EXTREMITY Bilateral 10/05/2019   Procedure: ABDOMINAL AORTOGRAM W/LOWER EXTREMITY;  Surgeon: Lorretta Harp, MD;  Location: [place] [facility];  Service: Cardiovascular;  Laterality: Bilateral;  . ABDOMINAL AORTOGRAM W/LOWER EXTREMITY N/A 11/09/2019   Procedure: ABDOMINAL AORTOGRAM W/LOWER EXTREMITY;  Surgeon: Lorretta Harp, MD;  Location: [place] [facility];  Service: Cardiovascular;  Laterality: N/A;  . CORONARY/GRAFT ACUTE MI REVASCULARIZATION N/A 07/10/2019   Procedure: CORONARY/GRAFT ACUTE MI REVASCULARIZATION;  Surgeon: Nelva Bush, MD;  Location: [place] [facility];  Service: Cardiovascular;  Laterality: N/A;  . INTRAVASCULAR LITHOTRIPSY  11/09/2019   Procedure:  INTRAVASCULAR LITHOTRIPSY;  Surgeon: Lorretta Harp, MD;  Location: [place] [facility];  Service: Cardiovascular;;  . LEFT HEART CATH AND CORONARY ANGIOGRAPHY N/A 07/10/2019   Procedure: LEFT HEART CATH AND CORONARY ANGIOGRAPHY;  Surgeon: Nelva Bush, MD;  Location: [place] [facility];  Service: Cardiovascular;  Laterality: N/A;  . PERCUTANEOUS CORONARY STENT INTERVENTION (PCI-S)  2017   DES to OM2 at [facility]  . PERIPHERAL VASCULAR ATHERECTOMY Left 10/05/2019   Procedure: PERIPHERAL VASCULAR ATHERECTOMY;  Surgeon: Lorretta Harp, MD;  Location: [place] [facility];  Service: Cardiovascular;  Laterality: Left;  SFA  . PERIPHERAL VASCULAR BALLOON ANGIOPLASTY   11/09/2019   Procedure: PERIPHERAL VASCULAR BALLOON ANGIOPLASTY;  Surgeon: Lorretta Harp, MD;  Location: [place] [facility];  Service: Cardiovascular;;  . PERIPHERAL VASCULAR INTERVENTION Left 10/05/2019   Procedure: PERIPHERAL VASCULAR INTERVENTION;  Surgeon: Lorretta Harp, MD;  Location: [place] [facility];  Service: Cardiovascular;  Laterality: Left;  SFA    Family History  Problem Relation Age of Onset  . Heart disease Mother   . Heart disease Father     No Known Allergies  Current Outpatient Medications on File Prior to Visit  Medication Sig Dispense Refill  . albuterol (VENTOLIN HFA) 108 (90 Base) MCG/ACT inhaler Inhale 1-2 puffs into the lungs every 6 (six) hours as needed for wheezing or shortness of breath. 8 g 2  . aspirin 81 MG chewable tablet Chew 1 tablet (81 mg total) by mouth daily. 90 tablet 1  . atorvastatin (LIPITOR) 80 MG tablet Take 1 tablet (80 mg total) by mouth daily at 6 PM. 90 tablet 3  . famotidine (PEPCID) 20 MG tablet Take 1 tablet (20 mg total) by mouth 2 (two) times daily. 180 tablet 1  . isosorbide mononitrate (IMDUR) 30 MG 24 hr tablet TAKE 1/2 TABLET BY MOUTH DAILY. 45 tablet 3  . metoprolol succinate (TOPROL XL) 25 MG 24 hr tablet Take 1 tablet by mouth once a day, may take an additional tablet as needed for palpitations 120 tablet 3  . nitroGLYCERIN (NITROSTAT) 0.4 MG SL tablet Place 1 tablet (0.4 mg total) under the tongue every 5 (five) minutes as needed. 25 tablet 2  . prasugrel (EFFIENT) 10 MG TABS tablet Take 1 tablet (10 mg total) by mouth daily. 90 tablet 3   No current facility-administered medications on file prior to visit.     patient denies chest pain, shortness of breath, orthopnea. Denies lower extremity edema, abdominal pain, change in appetite, change in bowel movements. Patient denies rashes, musculoskeletal complaints. No other specific complaints in a complete review of systems.   BP 112/67   Pulse 89   Resp 16   Wt 223 lb 9.6  oz (101.4 kg)   SpO2 96%   BMI 36.09 kg/m   well-developed well-nourished male in no acute distress. HEENT exam atraumatic, normocephalic, neck supple without jugular venous distention. Chest clear to auscultation cardiac exam S1-S2 are regular. Abdominal exam overweight with bowel sounds, soft and nontender. Extremities no edema except for knee swelling. Neurologic exam is alert with an antalgic gait.  msk- he has FROM of both knees but pain of left with full flexion. He has an effusion of left knee with bulge sign.   A/p Left knee pain- unclear etiology but I don't think he has an urgent issue.  I will avoid NSAIDs (see meds). I'll get an imaging study with weight bearing. Suspect OA. Treatment plan after the xray.

## 2020-06-05 ENCOUNTER — Telehealth: Payer: Self-pay

## 2020-06-05 DIAGNOSIS — M25562 Pain in left knee: Secondary | ICD-10-CM

## 2020-06-05 NOTE — Telephone Encounter (Signed)
Contacted pt to go over xray results pt is aware. Pt states he is still having pain in his knee and would like the referral.   Referral has ben submitted

## 2020-06-13 ENCOUNTER — Ambulatory Visit (INDEPENDENT_AMBULATORY_CARE_PROVIDER_SITE_OTHER): Payer: Self-pay | Admitting: Physician Assistant

## 2020-06-13 ENCOUNTER — Encounter: Payer: Self-pay | Admitting: Orthopedic Surgery

## 2020-06-13 VITALS — Ht 66.0 in | Wt 223.0 lb

## 2020-06-13 DIAGNOSIS — M23322 Other meniscus derangements, posterior horn of medial meniscus, left knee: Secondary | ICD-10-CM

## 2020-06-13 MED ORDER — METHYLPREDNISOLONE ACETATE 40 MG/ML IJ SUSP
40.0000 mg | INTRAMUSCULAR | Status: AC | PRN
  Administered 2020-06-13: 40 mg via INTRA_ARTICULAR

## 2020-06-13 MED ORDER — LIDOCAINE HCL 1 % IJ SOLN
5.0000 mL | INTRAMUSCULAR | Status: AC | PRN
  Administered 2020-06-13: 5 mL

## 2020-06-13 NOTE — Progress Notes (Signed)
Office Visit Note   Patient: Jeffrey Winters           Date of Birth: 01/25/1966           MRN: [id number] Visit Date: 06/13/2020              Requested by: Lisabeth Pick, MD [address] [place],  [place] [address] PCP: Lorretta Harp, MD  Chief Complaint  Patient presents with  . Left Knee - Pain      HPI: This is a pleasant gentleman who has a 4-month history of left medial knee pain.  He denies any injury.  He has begun bicycling.  He is not sure if this is what exaggerated his pain.  He said the knee at times is swollen though not very swollen today  Assessment & Plan: Visit Diagnoses: No diagnosis found.  Plan: Findings consistent with varus arthritis and possibly meniscus tear.  Discussed with him treatment options.  We will go forward with an injection today.  He will follow-up in 3 weeks to discuss the results.  Follow-Up Instructions: No follow-ups on file.   Ortho Exam  Patient is alert, oriented, no adenopathy, well-dressed, normal affect, normal respiratory effort. Examination of his knee he has no effusion no cellulitis.  No crepitus with range of motion no tenderness around the patellofemoral joint and lateral joint.  He does have acute tenderness over the posterior medial joint line.  Excellent stability. Imaging: X-rays of his knee done a week ago reviewed.  They demonstrate varus malalignment.  He does have some medial joint line narrowing.  No acute osseous changes No results found. No images are attached to the encounter.  Labs: Lab Results  Component Value Date   HGBA1C 6.3 (H) 07/10/2019     Lab Results  Component Value Date   ALBUMIN 4.0 11/07/2019   ALBUMIN 3.7 (L) 10/31/2019   ALBUMIN 3.0 (L) 07/10/2019    Lab Results  Component Value Date   MG 1.9 08/15/2019   No results found for: VD25OH  No results found for: PREALBUMIN CBC EXTENDED Latest Ref Rng & Units 11/07/2019 09/13/2019 07/21/2019  WBC 3.4 - 10.8 x10E3/uL 4.0 5.0 5.8   RBC 4.14 - 5.80 x10E6/uL 5.22 5.27 5.24  HGB 13.0 - 17.7 g/dL 14.2 14.4 14.2  HCT 37.5 - 51.0 % 43.1 42.7 43.1  PLT 150 - 450 x10E3/uL 178 210 253     Body mass index is 35.99 kg/m.  Orders:  No orders of the defined types were placed in this encounter.  No orders of the defined types were placed in this encounter.    Procedures: Large Joint Inj: L knee on 06/13/2020 1:38 PM Indications: pain and diagnostic evaluation Details: 22 G 1.5 in needle, medial approach  Arthrogram: No  Medications: 40 mg methylPREDNISolone acetate 40 MG/ML; 5 mL lidocaine 1 % Outcome: tolerated well, no immediate complications Procedure, treatment alternatives, risks and benefits explained, specific risks discussed. Consent was given by the patient.      Clinical Data: No additional findings.  ROS:  All other systems negative, except as noted in the HPI. Review of Systems  Objective: Vital Signs: Ht 5\' 6"  (1.676 m)   Wt 223 lb (101.2 kg)   BMI 35.99 kg/m   Specialty Comments:  No specialty comments available.  PMFS History: Patient Active Problem List   Diagnosis Date Noted  . Influenza vaccine refused 05/29/2020  . PAD (peripheral artery disease) ([place])   . Hyperlipidemia  07/12/2019  . STEMI (ST elevation myocardial infarction) ([place]) 07/10/2019  . STEMI involving right coronary artery ([place]) 07/10/2019   Past Medical History:  Diagnosis Date  . Coronary artery disease 2017   MI s/p PCI to OM2 // s/p MI 07/2019 >> DES to RCA; residual RPL2 80 // Myoview 08/2019: EF 61, small area of mod ischemia inf base; Low Risk  . Hypertension   . PAD (peripheral artery disease) ([place])    LE Art [place] 08/2019: R SFA 50-74; L SFA 50-74    Family History  Problem Relation Age of Onset  . Heart disease Mother   . Heart disease Father     Past Surgical History:  Procedure Laterality Date  . ABDOMINAL AORTOGRAM W/LOWER EXTREMITY Bilateral 10/05/2019   Procedure: ABDOMINAL AORTOGRAM W/LOWER  EXTREMITY;  Surgeon: Lorretta Harp, MD;  Location: [place] [facility];  Service: Cardiovascular;  Laterality: Bilateral;  . ABDOMINAL AORTOGRAM W/LOWER EXTREMITY N/A 11/09/2019   Procedure: ABDOMINAL AORTOGRAM W/LOWER EXTREMITY;  Surgeon: Lorretta Harp, MD;  Location: [place] [facility];  Service: Cardiovascular;  Laterality: N/A;  . CORONARY/GRAFT ACUTE MI REVASCULARIZATION N/A 07/10/2019   Procedure: CORONARY/GRAFT ACUTE MI REVASCULARIZATION;  Surgeon: Nelva Bush, MD;  Location: [place] [facility];  Service: Cardiovascular;  Laterality: N/A;  . INTRAVASCULAR LITHOTRIPSY  11/09/2019   Procedure: INTRAVASCULAR LITHOTRIPSY;  Surgeon: Lorretta Harp, MD;  Location: [place] [facility];  Service: Cardiovascular;;  . LEFT HEART CATH AND CORONARY ANGIOGRAPHY N/A 07/10/2019   Procedure: LEFT HEART CATH AND CORONARY ANGIOGRAPHY;  Surgeon: Nelva Bush, MD;  Location: [place] [facility];  Service: Cardiovascular;  Laterality: N/A;  . PERCUTANEOUS CORONARY STENT INTERVENTION (PCI-S)  2017   DES to OM2 at [facility]  . PERIPHERAL VASCULAR ATHERECTOMY Left 10/05/2019   Procedure: PERIPHERAL VASCULAR ATHERECTOMY;  Surgeon: Lorretta Harp, MD;  Location: [place] [facility];  Service: Cardiovascular;  Laterality: Left;  SFA  . PERIPHERAL VASCULAR BALLOON ANGIOPLASTY  11/09/2019   Procedure: PERIPHERAL VASCULAR BALLOON ANGIOPLASTY;  Surgeon: Lorretta Harp, MD;  Location: [place] [facility];  Service: Cardiovascular;;  . PERIPHERAL VASCULAR INTERVENTION Left 10/05/2019   Procedure: PERIPHERAL VASCULAR INTERVENTION;  Surgeon: Lorretta Harp, MD;  Location: [place] [facility];  Service: Cardiovascular;  Laterality: Left;  SFA   Social History   Occupational History  . Not on file  Tobacco Use  . Smoking status: Current Every Day Smoker  . Smokeless tobacco: Never Used  . Tobacco comment: 4 cigs/day  Substance and Sexual Activity  . Alcohol use: Not Currently  . Drug use: No  . Sexual activity:  Not on file

## 2020-06-19 ENCOUNTER — Encounter: Payer: Self-pay | Admitting: Pulmonary Disease

## 2020-06-19 ENCOUNTER — Other Ambulatory Visit: Payer: Self-pay

## 2020-06-19 ENCOUNTER — Ambulatory Visit (INDEPENDENT_AMBULATORY_CARE_PROVIDER_SITE_OTHER): Payer: Self-pay | Admitting: Pulmonary Disease

## 2020-06-19 ENCOUNTER — Ambulatory Visit (INDEPENDENT_AMBULATORY_CARE_PROVIDER_SITE_OTHER): Payer: Self-pay

## 2020-06-19 VITALS — BP 128/68 | HR 67 | Temp 98.4°F | Ht 66.0 in | Wt 222.0 lb

## 2020-06-19 DIAGNOSIS — R0600 Dyspnea, unspecified: Secondary | ICD-10-CM

## 2020-06-19 DIAGNOSIS — R0609 Other forms of dyspnea: Secondary | ICD-10-CM

## 2020-06-19 DIAGNOSIS — Z720 Tobacco use: Secondary | ICD-10-CM

## 2020-06-19 NOTE — Patient Instructions (Signed)
Nice to see you  We will get a chest xray today  We will get breathing tests in the future and see if the lungs are contributing to your shortness of breath  Return for follow up in 3 months with Dr. Silas Flood

## 2020-06-20 NOTE — Progress Notes (Signed)
@Patient  ID: Jeffrey Winters, male    DOB: 1966/07/13, 54 y.o.   MRN: [id number]  Chief Complaint  Patient presents with  . Consult    non productive cough, shortness of breath with heavy lifting    Referring provider: Sharmon Winters  HPI:   54 year old with significant coronary disease status post multiple MIs and PCI's whom are seen in consultation at the request of Jeffrey Dopp, PA for evaluation of dyspnea on exertion.  Notes from referring provider and cardiologist treating peripheral artery disease reviewed.  Patient notes shortness of breath for some time.  Made worse over the last several months.  No shortness of breath at rest but with exertion prickly going up inclines or stairs he gets short winded and needs to stop.  With rest his symptoms improved.  He has a nonproductive cough but is not too bothersome to him.  No weight loss, fever, chills.  He denies any frank chest pain or chest pressure.  No orthopnea or PND.  No chest images during the work-up of these complaints.  He follows with cardiology and most recent heart attack was 07/2019 with stent to RCA.  No follow-up echocardiogram seen in the system.  These also been followed by cardiology for peripheral arterial disease and stents lower extremity arteries.  He has ongoing complaints in his legs but it does seem the claudication is improved somewhat.  In terms of his dyspnea exertion, no other exacerbating alleviating factors.  No time of day worse worse.  No clear timing in terms of onset with acute event or otherwise readily identifiable trigger.  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  MMRC: No flowsheet data found.  Epworth:  No flowsheet data found.  Tests:   FENO:  No results found for: NITRICOXIDE  PFT: No flowsheet data found.  WALK:  No flowsheet data found.  Imaging: DG Knee Complete 4 Views Left  Result Date: 05/31/2020 CLINICAL DATA:  Pain with swelling EXAM: LEFT KNEE - COMPLETE 4+ VIEW COMPARISON:  None. FINDINGS: Mild tricompartment arthritis. No fracture or malalignment. Probable trace knee effusion. Vascular calcifications with incompletely visualized stent in the distal thigh. IMPRESSION: No acute osseous abnormality. Mild tricompartment arthritis with probable trace knee effusion. Electronically Signed   By: Jeffrey Winters M.D.   On: 05/31/2020 18:45   VAS [place] ABI WITH/WO TBI  Result Date: 05/21/2020 LOWER EXTREMITY DOPPLER STUDY Indications: Peripheral artery disease, and denies claudication, but c/o left              knee pain with walking and at rest, which is new over the past few              months. High Risk Factors: Hypertension, hyperlipidemia, current smoker, prior MI. Other Factors: SEE LEA DUPLEX REPORT.  Vascular Interventions: Left mid to distal SFA atherectomy/PTA/Stent 10/2019                         Right mid SFA PTA 11/2019. Comparison Study: 11/19/19 (see table) Performing Technologist: Jeffrey Winters BS, RVT, RDCS  Examination Guidelines: A complete evaluation includes at minimum, Doppler waveform signals and systolic blood  pressure reading at the level of bilateral brachial, anterior tibial, and posterior tibial arteries, when vessel segments are accessible. Bilateral testing is considered an integral part of a complete examination. Photoelectric Plethysmograph (PPG) waveforms and toe systolic pressure readings are included as required and additional duplex testing as needed. Limited examinations for reoccurring indications may be performed as noted.  ABI Findings:  +---------+------------------+-----+--------+--------+ Right    Rt Pressure (mmHg)IndexWaveformComment  +---------+------------------+-----+--------+--------+ Brachial 128                                     +---------+------------------+-----+--------+--------+ ATA      137               1.01 biphasic         +---------+------------------+-----+--------+--------+ PTA      146               1.07 biphasic         +---------+------------------+-----+--------+--------+ PERO     138               1.01 biphasic         +---------+------------------+-----+--------+--------+ Great Toe113               0.83 Normal           +---------+------------------+-----+--------+--------+ +---------+------------------+-----+--------+-------+ Left     Lt Pressure (mmHg)IndexWaveformComment +---------+------------------+-----+--------+-------+ Brachial 136                                    +---------+------------------+-----+--------+-------+ ATA      107               0.79 biphasic        +---------+------------------+-----+--------+-------+ PTA      145               1.07 biphasic        +---------+------------------+-----+--------+-------+ PERO     134               0.99 biphasic        +---------+------------------+-----+--------+-------+ Great Toe107               0.79 Normal          +---------+------------------+-----+--------+-------+ +-------+-----------+-----------+------------+------------+ ABI/TBIToday's ABIToday's TBIPrevious ABIPrevious TBI +-------+-----------+-----------+------------+------------+ Right  1.07       .83        1.03        .77          +-------+-----------+-----------+------------+------------+ Left   1.07       .79        1.05        .53          +-------+-----------+-----------+------------+------------+ Ankle waveforms are bi to triphasic. Bilateral TBIs appear essentially unchanged compared to prior study on  11/16/19.  Summary: Right: Resting right ankle-brachial index is within normal range. No evidence of significant right lower extremity arterial disease. The right toe-brachial index is normal. Left: Resting left ankle-brachial index is within normal range. No evidence of significant left lower extremity arterial disease. The left toe-brachial index is abnormal.  *See table(s) above for measurements and observations.  Suggest follow up study in 6 months. Electronically signed by Ena Dawley MD on 05/21/2020 at 5:45:01 PM.    Final    VAS [place] LOWER EXTREMITY ARTERIAL DUPLEX  Result Date: 05/21/2020 LOWER EXTREMITY ARTERIAL DUPLEX  STUDY Indications: Peripheral artery disease, and denies claudication. c/o new left              knee pain with walking and at rest. High Risk Factors: Hypertension, hyperlipidemia, current smoker, prior MI. Other Factors: SEE ABI REPORT.  Vascular Interventions: Left mid to distal SFA atherectomy/PTA/Stent 10/2019                         Right mid SFA PTA 11/2019. Current ABI:            Bilateral >1.0. Comparison Study: 11/16/19 right LEA duplex, 10/18/19 left LEA duplex. SFA's were                   reportedly widely patent in both. Performing Technologist: [employer] BS, RVT, RDCS  Examination Guidelines: A complete evaluation includes B-mode imaging, spectral Doppler, color Doppler, and power Doppler as needed of all accessible portions of each vessel. Bilateral testing is considered an integral part of a complete examination. Limited examinations for reoccurring indications may be performed as noted.  +----------+--------+-----+--------+---------+-------------------+ RIGHT     PSV cm/sRatioStenosisWaveform Comments            +----------+--------+-----+--------+---------+-------------------+ CFA Prox  131                  triphasic                    +----------+--------+-----+--------+---------+-------------------+ DFA       125                  triphasic                     +----------+--------+-----+--------+---------+-------------------+ SFA Prox  73                   triphasic                    +----------+--------+-----+--------+---------+-------------------+ SFA Mid   107                  triphasicheterogenous plaque +----------+--------+-----+--------+---------+-------------------+ SFA Distal95                   triphasic                    +----------+--------+-----+--------+---------+-------------------+ POP Prox  140                  triphasicheterogenous plaque +----------+--------+-----+--------+---------+-------------------+ POP Distal34                   triphasic                    +----------+--------+-----+--------+---------+-------------------+ TP Trunk  115                  triphasic                    +----------+--------+-----+--------+---------+-------------------+  +----------+--------+-----+--------+---------+--------+ LEFT      PSV cm/sRatioStenosisWaveform Comments +----------+--------+-----+--------+---------+--------+ CFA Prox  138                  triphasic         +----------+--------+-----+--------+---------+--------+ DFA       116                  triphasic         +----------+--------+-----+--------+---------+--------+ SFA Prox  71  triphasic         +----------+--------+-----+--------+---------+--------+ SFA Mid                                 stent    +----------+--------+-----+--------+---------+--------+ SFA Distal                              stent    +----------+--------+-----+--------+---------+--------+ POP Prox  186                  triphasic         +----------+--------+-----+--------+---------+--------+ POP Distal129                  triphasic         +----------+--------+-----+--------+---------+--------+ TP Trunk  81                   triphasic          +----------+--------+-----+--------+---------+--------+  Left Stent(s): +-----------------+--------+--------+---------+----------+ mid to distal SFAPSV cm/sStenosisWaveform Comments   +-----------------+--------+--------+---------+----------+ Prox to Stent    106             triphasicmid SFA    +-----------------+--------+--------+---------+----------+ Proximal Stent   132             triphasic           +-----------------+--------+--------+---------+----------+ Mid Stent        133             triphasic           +-----------------+--------+--------+---------+----------+ Distal Stent     139             triphasic           +-----------------+--------+--------+---------+----------+ Distal to Stent  154             triphasicdistal SFA +-----------------+--------+--------+---------+----------+    Summary: Right: Patent right mid SFA atherectomy. Left: Patent left mid through distal SFA stent.  See table(s) above for measurements and observations. Suggest follow up study in 6 months. Electronically signed by Ena Dawley MD on 05/21/2020 at 5:45:15 PM.    Final     Lab Results: Personally reviewed CBC    Component Value Date/Time   WBC 4.0 11/07/2019 1432   WBC 6.4 07/11/2019 0247   RBC 5.22 11/07/2019 1432   RBC 5.03 07/11/2019 0247   HGB 14.2 11/07/2019 1432   HCT 43.1 11/07/2019 1432   PLT 178 11/07/2019 1432   MCV 83 11/07/2019 1432   MCH 27.2 11/07/2019 1432   MCH 27.2 07/11/2019 0247   MCHC 32.9 11/07/2019 1432   MCHC 32.7 07/11/2019 0247   RDW 13.9 11/07/2019 1432    BMET    Component Value Date/Time   NA 142 11/07/2019 1432   K 4.7 11/07/2019 1432   CL 104 11/07/2019 1432   CO2 23 11/07/2019 1432   GLUCOSE 90 11/07/2019 1432   GLUCOSE 125 (H) 07/11/2019 0247   BUN 14 11/07/2019 1432   CREATININE 1.04 11/07/2019 1432   CALCIUM 9.4 11/07/2019 1432   GFRNONAA 81 11/07/2019 1432   GFRAA 94 11/07/2019 1432    BNP No results found for:  BNP  ProBNP    Component Value Date/Time   PROBNP 233 (H) 07/21/2019 1120    Specialty Problems    None      No Known Allergies   There is no immunization history on file for  this patient.  Past Medical History:  Diagnosis Date  . Coronary artery disease 2017   MI s/p PCI to OM2 // s/p MI 07/2019 >> DES to RCA; residual RPL2 80 // Myoview 08/2019: EF 61, small area of mod ischemia inf base; Low Risk  . Hypertension   . PAD (peripheral artery disease) ([place])    LE Art [place] 08/2019: R SFA 50-74; L SFA 50-74    Tobacco History: Social History   Tobacco Use  Smoking Status Current Every Day Smoker  . Packs/day: 1.00  . Years: 7.00  . Pack years: 7.00  . Types: Cigarettes  Smokeless Tobacco Never Used  Tobacco Comment   smokes 1/2 pack per day 06/19/2020   Ready to quit: Not Answered Counseling given: Not Answered Comment: smokes 1/2 pack per day 06/19/2020   Continue to not smoke  Outpatient Encounter Medications as of 06/19/2020  Medication Sig  . albuterol (VENTOLIN HFA) 108 (90 Base) MCG/ACT inhaler Inhale 1-2 puffs into the lungs every 6 (six) hours as needed for wheezing or shortness of breath.  Marland Kitchen aspirin 81 MG chewable tablet Chew 1 tablet (81 mg total) by mouth daily.  Marland Kitchen atorvastatin (LIPITOR) 80 MG tablet Take 1 tablet (80 mg total) by mouth daily at 6 PM.  . famotidine (PEPCID) 20 MG tablet Take 1 tablet (20 mg total) by mouth 2 (two) times daily.  . isosorbide mononitrate (IMDUR) 30 MG 24 hr tablet TAKE 1/2 TABLET BY MOUTH DAILY.  . metoprolol succinate (TOPROL XL) 25 MG 24 hr tablet Take 1 tablet by mouth once a day, may take an additional tablet as needed for palpitations  . nitroGLYCERIN (NITROSTAT) 0.4 MG SL tablet Place 1 tablet (0.4 mg total) under the tongue every 5 (five) minutes as needed.  . prasugrel (EFFIENT) 10 MG TABS tablet Take 1 tablet (10 mg total) by mouth daily.   No facility-administered encounter medications on file as of 06/19/2020.      Review of Systems  Review of Systems  No seasonal allergies.  No history of breathing issues as a younger man or child.  Started smoking just a few years ago.  Comprehensive review of systems otherwise negative.  Physical Exam  BP 128/68 (BP Location: Left Arm, Cuff Size: Normal)   Pulse 67   Temp 98.4 F (36.9 C) (Other (Comment)) Comment (Src): wrist  Ht 5\' 6"  (1.676 m)   Wt 222 lb (100.7 kg)   SpO2 98% Comment: room air  BMI 35.83 kg/m   Wt Readings from Last 5 Encounters:  06/19/20 222 lb (100.7 kg)  06/13/20 223 lb (101.2 kg)  05/29/20 223 lb 9.6 oz (101.4 kg)  05/14/20 224 lb 3.2 oz (101.7 kg)  11/24/19 225 lb 6.4 oz (102.2 kg)    BMI Readings from Last 5 Encounters:  06/19/20 35.83 kg/m  06/13/20 35.99 kg/m  05/29/20 36.09 kg/m  05/14/20 36.19 kg/m  11/24/19 36.38 kg/m     Physical Exam General: Well-appearing, no distress Eyes: EOMI, no icterus Neck: Supple, no JVP appreciated Respiratory: Clear aspiration bilaterally, no wheeze Cardiovascular: Regular rhythm, no murmurs Abdomen: Nondistended, bowel sounds present MSK: No synovitis, no joint effusion Neuro: Normal gait, no weakness Psych: Normal mood, full affect   Assessment & Plan:   Dyspnea on exertion: Possibly multifactorial.  Do believe that significant contribution from underlying coronary disease, possible anginal equivalent.  He is only smoked for 7 years or so.  This does put him at risk for development of eczema or  COPD but is a relatively small amount of smoking.  Will evaluate for pulmonary causes of dyspnea on exertion with PFTs in coming days.  If normal will reach out to cardiology on request additional evaluation, possible TTE, ischemic evaluation per their expertise.  Accordingly, if PFTs demonstrate pulmonary contribution to dyspnea exertion, will initiate therapy once results have returned.   Return in about 3 months (around 09/19/2020).   Lanier Clam,  MD 06/20/2020

## 2020-07-02 MED FILL — ISOSORBIDE MN ER 30 MG TAB: 30 | 30 days supply | Qty: 15 | Fill #1

## 2020-07-02 MED FILL — ATORVASTATIN CALCIUM 80 MG: 80 | 30 days supply | Qty: 30 | Fill #3

## 2020-07-02 MED FILL — PRASUGREL HCL 10 MG TABS: 10 | 30 days supply | Qty: 30 | Fill #3

## 2020-07-04 ENCOUNTER — Encounter (HOSPITAL_COMMUNITY): Payer: Self-pay | Admitting: Emergency Medicine

## 2020-07-04 ENCOUNTER — Other Ambulatory Visit: Payer: Self-pay

## 2020-07-04 ENCOUNTER — Encounter: Payer: Self-pay | Admitting: Physician Assistant

## 2020-07-04 ENCOUNTER — Ambulatory Visit (INDEPENDENT_AMBULATORY_CARE_PROVIDER_SITE_OTHER): Payer: Self-pay | Admitting: Physician Assistant

## 2020-07-04 ENCOUNTER — Emergency Department (HOSPITAL_COMMUNITY): Payer: Self-pay

## 2020-07-04 ENCOUNTER — Emergency Department (HOSPITAL_COMMUNITY)
Admission: EM | Admit: 2020-07-04 | Discharge: 2020-07-05 | Disposition: A | Discharge status: Home / Self Care | Payer: Self-pay | Attending: Emergency Medicine | Admitting: Emergency Medicine

## 2020-07-04 VITALS — Ht 66.0 in | Wt 222.0 lb

## 2020-07-04 DIAGNOSIS — F17210 Nicotine dependence, cigarettes, uncomplicated: Secondary | ICD-10-CM | POA: Insufficient documentation

## 2020-07-04 DIAGNOSIS — I10 Essential (primary) hypertension: Secondary | ICD-10-CM | POA: Insufficient documentation

## 2020-07-04 DIAGNOSIS — R079 Chest pain, unspecified: Secondary | ICD-10-CM | POA: Insufficient documentation

## 2020-07-04 DIAGNOSIS — M23322 Other meniscus derangements, posterior horn of medial meniscus, left knee: Secondary | ICD-10-CM

## 2020-07-04 DIAGNOSIS — R002 Palpitations: Secondary | ICD-10-CM | POA: Insufficient documentation

## 2020-07-04 DIAGNOSIS — Z7982 Long term (current) use of aspirin: Secondary | ICD-10-CM | POA: Insufficient documentation

## 2020-07-04 DIAGNOSIS — R42 Dizziness and giddiness: Secondary | ICD-10-CM | POA: Insufficient documentation

## 2020-07-04 LAB — CBC
HCT: 44.2 % (ref 39.0–52.0)
Hemoglobin: 14.7 g/dL (ref 13.0–17.0)
MCH: 28.5 pg (ref 26.0–34.0)
MCHC: 33.3 g/dL (ref 30.0–36.0)
MCV: 85.7 fL (ref 80.0–100.0)
Platelets: 168 10*3/uL (ref 150–400)
RBC: 5.16 MIL/uL (ref 4.22–5.81)
RDW: 13.5 % (ref 11.5–15.5)
WBC: 4.2 10*3/uL (ref 4.0–10.5)
nRBC: 0 % (ref 0.0–0.2)

## 2020-07-04 LAB — BASIC METABOLIC PANEL
Anion gap: 10 (ref 5–15)
BUN: 13 mg/dL (ref 6–20)
CO2: 26 mmol/L (ref 22–32)
Calcium: 9.1 mg/dL (ref 8.9–10.3)
Chloride: 102 mmol/L (ref 98–111)
Creatinine, Ser: 1.06 mg/dL (ref 0.61–1.24)
GFR, Estimated: >60 mL/min (ref >60)
Glucose, Bld: 118 mg/dL — ABNORMAL HIGH (ref 70–99)
Potassium: 4.1 mmol/L (ref 3.5–5.1)
Sodium: 138 mmol/L (ref 135–145)

## 2020-07-04 LAB — TROPONIN I (HIGH SENSITIVITY)
Troponin I (High Sensitivity): 15 ng/L (ref <18)
Troponin I (High Sensitivity): 16 ng/L (ref <18)

## 2020-07-04 MED ORDER — PRASUGREL HCL 10 MG PO TABS
10.0000 mg | ORAL_TABLET | Freq: Once | ORAL | Status: AC
  Administered 2020-07-04: 10 mg via ORAL
  Filled 2020-07-04: qty 1

## 2020-07-04 MED ORDER — METOPROLOL SUCCINATE ER 25 MG PO TB24
25.0000 mg | ORAL_TABLET | Freq: Once | ORAL | Status: AC
  Administered 2020-07-04: 25 mg via ORAL
  Filled 2020-07-04: qty 1

## 2020-07-04 MED ORDER — ISOSORBIDE MONONITRATE ER 30 MG PO TB24
30.0000 mg | ORAL_TABLET | Freq: Once | ORAL | Status: AC
  Administered 2020-07-04: 30 mg via ORAL
  Filled 2020-07-04: qty 1

## 2020-07-04 NOTE — Progress Notes (Signed)
Office Visit Note   Patient: Jeffrey Winters           Date of Birth: Jan 09, 1966           MRN: [id number] Visit Date: 07/04/2020              Requested by: Lorretta Harp, MD [address] [place],  [place] [address] PCP: Lorretta Harp, MD  Chief Complaint  Patient presents with  . Left Knee - Follow-up    S/p cortisone injection 06/13/20      HPI: Patient is a pleasant 54 year old gentleman who presented today to follow-up on his left medial knee pain.  He did have a cortisone injection 3 weeks ago.  Upon arrival he discussed with her medical assistant that he was having shortness of breath and felt like "his chest was going to explode "he also related a history of diaphoresis.  He has a significant cardiac history including multiple myocardial infarctions per his report and his stent.  Assessment & Plan: Visit Diagnoses: No diagnosis found.  Plan: Patient was discussed by Dr. Sharol Given we have called EMS and believe he needs emergent referral to the emergency room for evaluation.  Certainly we can follow-up on his injection at another time or on phone.  Follow-Up Instructions: No follow-ups on file.   Ortho Exam  Patient is alert, oriented, no adenopathy, well-dressed, normal affect, normal respiratory effort. Blood pressure is 137/81 heart rate is 71 he is pulse oxing at 97% on room air he is sitting in the chair answers questions appropriately alert and oriented EMS has arrived  Imaging: No results found. No images are attached to the encounter.  Labs: Lab Results  Component Value Date   HGBA1C 6.3 (H) 07/10/2019     Lab Results  Component Value Date   ALBUMIN 4.0 11/07/2019   ALBUMIN 3.7 (L) 10/31/2019   ALBUMIN 3.0 (L) 07/10/2019    Lab Results  Component Value Date   MG 1.9 08/15/2019   No results found for: VD25OH  No results found for: PREALBUMIN CBC EXTENDED Latest Ref Rng & Units 11/07/2019 09/13/2019 07/21/2019  WBC 3.4 - 10.8  x10E3/uL 4.0 5.0 5.8  RBC 4.14 - 5.80 x10E6/uL 5.22 5.27 5.24  HGB 13.0 - 17.7 g/dL 14.2 14.4 14.2  HCT 37.5 - 51.0 % 43.1 42.7 43.1  PLT 150 - 450 x10E3/uL 178 210 253     Body mass index is 35.83 kg/m.  Orders:  No orders of the defined types were placed in this encounter.  No orders of the defined types were placed in this encounter.    Procedures: No procedures performed  Clinical Data: No additional findings.  ROS:  All other systems negative, except as noted in the HPI. Review of Systems  Objective: Vital Signs: Ht 5\' 6"  (1.676 m)   Wt 222 lb (100.7 kg)   BMI 35.83 kg/m   Specialty Comments:  No specialty comments available.  PMFS History: Patient Active Problem List   Diagnosis Date Noted  . Influenza vaccine refused 05/29/2020  . PAD (peripheral artery disease) ([place])   . Hyperlipidemia 07/12/2019  . STEMI (ST elevation myocardial infarction) ([place]) 07/10/2019  . STEMI involving right coronary artery ([place]) 07/10/2019   Past Medical History:  Diagnosis Date  . Coronary artery disease 2017   MI s/p PCI to OM2 // s/p MI 07/2019 >> DES to RCA; residual RPL2 80 // Myoview 08/2019: EF 61, small area of mod ischemia inf base;  Low Risk  . Hypertension   . PAD (peripheral artery disease) ([place])    LE Art [place] 08/2019: R SFA 50-74; L SFA 50-74    Family History  Problem Relation Age of Onset  . Heart disease Mother   . Heart disease Father     Past Surgical History:  Procedure Laterality Date  . ABDOMINAL AORTOGRAM W/LOWER EXTREMITY Bilateral 10/05/2019   Procedure: ABDOMINAL AORTOGRAM W/LOWER EXTREMITY;  Surgeon: Lorretta Harp, MD;  Location: [facility];  Service: Cardiovascular;  Laterality: Bilateral;  . ABDOMINAL AORTOGRAM W/LOWER EXTREMITY N/A 11/09/2019   Procedure: ABDOMINAL AORTOGRAM W/LOWER EXTREMITY;  Surgeon: Lorretta Harp, MD;  Location: [facility];  Service: Cardiovascular;  Laterality: N/A;  . CORONARY/GRAFT ACUTE MI  REVASCULARIZATION N/A 07/10/2019   Procedure: CORONARY/GRAFT ACUTE MI REVASCULARIZATION;  Surgeon: Nelva Bush, MD;  Location: [facility];  Service: Cardiovascular;  Laterality: N/A;  . INTRAVASCULAR LITHOTRIPSY  11/09/2019   Procedure: INTRAVASCULAR LITHOTRIPSY;  Surgeon: Lorretta Harp, MD;  Location: [facility];  Service: Cardiovascular;;  . LEFT HEART CATH AND CORONARY ANGIOGRAPHY N/A 07/10/2019   Procedure: LEFT HEART CATH AND CORONARY ANGIOGRAPHY;  Surgeon: Nelva Bush, MD;  Location: [facility];  Service: Cardiovascular;  Laterality: N/A;  . PERCUTANEOUS CORONARY STENT INTERVENTION (PCI-S)  2017   DES to OM2 [facility]  . PERIPHERAL VASCULAR ATHERECTOMY Left 10/05/2019   Procedure: PERIPHERAL VASCULAR ATHERECTOMY;  Surgeon: Lorretta Harp, MD;  Location: [place] [facility];  Service: Cardiovascular;  Laterality: Left;  SFA  . PERIPHERAL VASCULAR BALLOON ANGIOPLASTY  11/09/2019   Procedure: PERIPHERAL VASCULAR BALLOON ANGIOPLASTY;  Surgeon: Lorretta Harp, MD;  Location: [place] [facility];  Service: Cardiovascular;;  . PERIPHERAL VASCULAR INTERVENTION Left 10/05/2019   Procedure: PERIPHERAL VASCULAR INTERVENTION;  Surgeon: Lorretta Harp, MD;  Location: [place] [facility];  Service: Cardiovascular;  Laterality: Left;  SFA   Social History   Occupational History  . Not on file  Tobacco Use  . Smoking status: Current Every Day Smoker    Packs/day: 1.00    Years: 7.00    Pack years: 7.00    Types: Cigarettes  . Smokeless tobacco: Never Used  . Tobacco comment: smokes 1/2 pack per day 06/19/2020  Substance and Sexual Activity  . Alcohol use: Not Currently  . Drug use: No  . Sexual activity: Not on file

## 2020-07-04 NOTE — ED Triage Notes (Addendum)
Pt arrives via [facility] from ortho office where he was getting a cortizone shot in knee. Woke up at 3am this morning, diaphoretic, lightheaded, sob and c/o chest pain. Reports he has been out of all meds x2 days, cardiac hx (stemi, high cholesterol). Upon ems arrival, he reported continued palpitations and lightheadedness, some PVCs on monitor, skin warm and dry, denied pain. 18G IV LFA. EMS VSS 132/83, HR76, O2 100% on ra, 16RR, cbg 135.

## 2020-07-05 ENCOUNTER — Other Ambulatory Visit: Payer: Self-pay | Admitting: Emergency Medicine

## 2020-07-05 MED ORDER — NITROGLYCERIN 0.4 MG SL SUBL: 0.4000 mg | SUBLINGUAL_TABLET | SUBLINGUAL | 0 refills | Status: DC | PRN

## 2020-07-05 MED FILL — NITROGLYCERIN 0.4 MG TAB SL: 0.4 | 8 days supply | Qty: 25 | Fill #0

## 2020-07-05 NOTE — ED Provider Notes (Signed)
[facility] EMERGENCY DEPARTMENT Provider Note   CSN: [id number] Arrival date & time: 07/04/20  1354     History Chief Complaint  Patient presents with   Chest Pain   Palpitations    Jeffrey Winters is a 54 y.o. male.  HPI Patient presents with chest pain.  States he woke up at this this morning feeling sweaty little lightheaded.  States that the pain resolved on its own.  Went to Ortho today about follow-up for a knee injection.  States had continued palpitations and lightheaded.  Came in here.  Reported was sweaty last night.  Pain-free now.  Does feel some palpitations.  States that yesterday and today he has not had his heart medicines because he ran out.  No fevers.  No lightheadedness.  Sees [name] from cardiology.  Pain felt different than previous angina.    Past Medical History:  Diagnosis Date   Coronary artery disease 2017   MI s/p PCI to OM2 // s/p MI 07/2019 >> DES to RCA; residual RPL2 80 // Myoview 08/2019: EF 61, small area of mod ischemia inf base; Low Risk   Hypertension    PAD (peripheral artery disease) ([place])    LE Art [place] 08/2019: R SFA 50-74; L SFA 50-74    Patient Active Problem List   Diagnosis Date Noted   Influenza vaccine refused 05/29/2020   PAD (peripheral artery disease) ([facility])    Hyperlipidemia 07/12/2019   STEMI (ST elevation myocardial infarction) ([place]) 07/10/2019   STEMI involving right coronary artery ([place]) 07/10/2019    Past Surgical History:  Procedure Laterality Date   ABDOMINAL AORTOGRAM W/LOWER EXTREMITY Bilateral 10/05/2019   Procedure: ABDOMINAL AORTOGRAM W/LOWER EXTREMITY;  Surgeon: Lorretta Harp, MD;  Location: [place] [facility];  Service: Cardiovascular;  Laterality: Bilateral;   ABDOMINAL AORTOGRAM W/LOWER EXTREMITY N/A 11/09/2019   Procedure: ABDOMINAL AORTOGRAM W/LOWER EXTREMITY;  Surgeon: Lorretta Harp, MD;  Location: [place] [facility];  Service: Cardiovascular;  Laterality: N/A;    CORONARY/GRAFT ACUTE MI REVASCULARIZATION N/A 07/10/2019   Procedure: CORONARY/GRAFT ACUTE MI REVASCULARIZATION;  Surgeon: Nelva Bush, MD;  Location: [place] [facility];  Service: Cardiovascular;  Laterality: N/A;   INTRAVASCULAR LITHOTRIPSY  11/09/2019   Procedure: INTRAVASCULAR LITHOTRIPSY;  Surgeon: Lorretta Harp, MD;  Location: [place] [facility];  Service: Cardiovascular;;   LEFT HEART CATH AND CORONARY ANGIOGRAPHY N/A 07/10/2019   Procedure: LEFT HEART CATH AND CORONARY ANGIOGRAPHY;  Surgeon: Nelva Bush, MD;  Location: [place] [facility];  Service: Cardiovascular;  Laterality: N/A;   PERCUTANEOUS CORONARY STENT INTERVENTION (PCI-S)  2017   DES to OM2 at [place] Left 10/05/2019   Procedure: PERIPHERAL VASCULAR ATHERECTOMY;  Surgeon: Lorretta Harp, MD;  Location: [place] [facility];  Service: Cardiovascular;  Laterality: Left;  SFA   PERIPHERAL VASCULAR BALLOON ANGIOPLASTY  11/09/2019   Procedure: PERIPHERAL VASCULAR BALLOON ANGIOPLASTY;  Surgeon: Lorretta Harp, MD;  Location: [place] [facility];  Service: Cardiovascular;;   PERIPHERAL VASCULAR INTERVENTION Left 10/05/2019   Procedure: PERIPHERAL VASCULAR INTERVENTION;  Surgeon: Lorretta Harp, MD;  Location: [place] [facility];  Service: Cardiovascular;  Laterality: Left;  SFA       Family History  Problem Relation Age of Onset   Heart disease Mother    Heart disease Father     Social History   Tobacco Use   Smoking status: Current Every Day Smoker    Packs/day: 1.00  Years: 7.00    Pack years: 7.00    Types: Cigarettes   Smokeless tobacco: Never Used   Tobacco comment: smokes 1/2 pack per day 06/19/2020  Substance Use Topics   Alcohol use: Not Currently   Drug use: No    Home Medications Prior to Admission medications   Medication Sig Start Date End Date Taking? Authorizing Provider  albuterol (VENTOLIN HFA) 108 (90 Base) MCG/ACT inhaler Inhale 1-2 puffs  into the lungs every 6 (six) hours as needed for wheezing or shortness of breath. 05/14/20  Yes [name] T, PA-C  aspirin 81 MG chewable tablet Chew 1 tablet (81 mg total) by mouth daily. 07/13/19  Yes Cheryln Manly, NP  atorvastatin (LIPITOR) 80 MG tablet Take 1 tablet (80 mg total) by mouth daily at 6 PM. 08/07/19  Yes Sherren Mocha, MD  diphenhydramine-acetaminophen (TYLENOL PM) 25-500 MG TABS tablet Take 0.5 tablets by mouth at bedtime.   Yes [provider]  famotidine (PEPCID) 20 MG tablet Take 1 tablet (20 mg total) by mouth 2 (two) times daily. 07/21/19  Yes Weaver, Scott T, PA-C  isosorbide mononitrate (IMDUR) 30 MG 24 hr tablet TAKE 1/2 TABLET BY MOUTH DAILY. Patient taking differently: Take 15 mg by mouth in the morning.  05/17/20  Yes Weaver, Scott T, PA-C  metoprolol succinate (TOPROL XL) 25 MG 24 hr tablet Take 1 tablet by mouth once a day, may take an additional tablet as needed for palpitations Patient taking differently: Take 25 mg by mouth See admin instructions. Take 25 mg by mouth once a day and an additional 25 mg as needed for palpitations 08/15/19  Yes [name] T, PA-C  nitroGLYCERIN (NITROSTAT) 0.4 MG SL tablet Place 1 tablet (0.4 mg total) under the tongue every 5 (five) minutes as needed. Patient taking differently: Place 0.4 mg under the tongue every 5 (five) minutes as needed for chest pain.  07/12/19  Yes Reino Bellis B, NP  prasugrel (EFFIENT) 10 MG TABS tablet Take 1 tablet (10 mg total) by mouth daily. 03/12/20  Yes Nicolette Bang, DO    Allergies    Patient has no known allergies.  Review of Systems   Review of Systems  Constitutional: Positive for diaphoresis. Negative for appetite change.  HENT: Negative for congestion.   Respiratory: Negative for shortness of breath.   Cardiovascular: Positive for chest pain.  Gastrointestinal: Negative for abdominal pain.  Genitourinary: Negative for genital sores.  Musculoskeletal:  Negative for back pain.  Skin: Negative for rash.  Neurological: Negative for weakness.  Psychiatric/Behavioral: Negative for confusion.    Physical Exam Updated Vital Signs BP 129/84    Pulse 70    Temp 98.1 F (36.7 C) (Oral)    Resp 20    Ht 5\' 6"  (1.676 m)    Wt 100.7 kg    SpO2 98%    BMI 35.83 kg/m   Physical Exam Vitals and nursing note reviewed.  Constitutional:      Appearance: He is well-developed.  HENT:     Head: Normocephalic.  Cardiovascular:     Rate and Rhythm: Normal rate and regular rhythm.  Pulmonary:     Breath sounds: No wheezing or rhonchi.  Chest:     Chest wall: No tenderness.  Abdominal:     Tenderness: There is no abdominal tenderness.  Musculoskeletal:     Right lower leg: No edema.     Left lower leg: No edema.  Skin:    General: Skin is  warm.     Capillary Refill: Capillary refill takes less than 2 seconds.  Neurological:     Mental Status: He is alert and oriented to person, place, and time.     ED Results / Procedures / Treatments   Labs (all labs ordered are listed, but only abnormal results are displayed) Labs Reviewed  BASIC METABOLIC PANEL - Abnormal; Notable for the following components:      Result Value   Glucose, Bld 118 (*)    All other components within normal limits  CBC  TROPONIN I (HIGH SENSITIVITY)  TROPONIN I (HIGH SENSITIVITY)    EKG EKG Interpretation  Date/Time:  Thursday July 04 2020 22:07:35 EST Ventricular Rate:  57 PR Interval:  166 QRS Duration: 96 QT Interval:  419 QTC Calculation: 408 R Axis:   62 Text Interpretation: Sinus rhythm Abnormal inferior Q waves ST elev, probable normal early repol pattern similar to prior today Confirmed by Davonna Belling [phone]) on 07/04/2020 11:31:32 PM   Radiology DG Chest 2 View  Result Date: 07/04/2020 CLINICAL DATA:  Shortness of breath, chest pain and palpitations. EXAM: CHEST - 2 VIEW COMPARISON:  06/19/2020 FINDINGS: Heart, mediastinum and hila are  unremarkable. Clear lungs. No pleural effusion or pneumothorax. Skeletal structures are intact IMPRESSION: No active cardiopulmonary disease. Electronically Signed   By: Lajean Manes M.D.   On: 07/04/2020 14:53    Procedures Procedures (including critical care time)  Medications Ordered in ED Medications  metoprolol succinate (TOPROL-XL) 24 hr tablet 25 mg (25 mg Oral Given 07/04/20 2340)  isosorbide mononitrate (IMDUR) 24 hr tablet 30 mg (30 mg Oral Given 07/04/20 2340)  prasugrel (EFFIENT) tablet 10 mg (10 mg Oral Given 07/04/20 2340)    ED Course  I have reviewed the triage vital signs and the nursing notes.  Pertinent labs & imaging results that were available during my care of the patient were reviewed by me and considered in my medical decision making (see chart for details).    MDM Rules/Calculators/A&P                         Patient woke up with episode of palpitations.  Has been out of medicines for a couple days.  Has been doing better during the day with pain however.  EKG reassuring.  Troponin negative x2.  Think patient would benefit from getting started back on his medicines.  First dose given here cardiac medicines.  Did have PVCs on EKG that are potentially symptomatic.  Not had exertional pain today.  Does have cardiac history however and feels patient benefit from follow-up with cardiology.  Will discharge home.  Final Clinical Impression(s) / ED Diagnoses Final diagnoses:  Nonspecific chest pain    Rx / DC Orders ED Discharge Orders    None       Davonna Belling, MD 07/05/20 0107

## 2020-07-05 NOTE — Discharge Instructions (Signed)
Pick up your refills tomorrow.  Follow-up with cardiology as needed.  Return for worsening symptoms

## 2020-07-05 NOTE — ED Notes (Signed)
Discharge instructions provided to patient. Verbalized understanding. Alert and oriented. IV lock removed. Ambulated with steady gait out of ED.

## 2020-07-30 MED FILL — ATORVASTATIN CALCIUM 80 MG: 80 | 30 days supply | Qty: 30 | Fill #4

## 2020-07-30 MED FILL — PRASUGREL HCL 10 MG TABS: 10 | 30 days supply | Qty: 30 | Fill #4

## 2020-07-30 MED FILL — METOPROLOL SUCCINATE ER 25: 25 | 30 days supply | Qty: 60 | Fill #2

## 2020-07-30 MED FILL — ISOSORBIDE MN ER 30 MG TAB: 30 | 30 days supply | Qty: 15 | Fill #2

## 2020-08-01 ENCOUNTER — Other Ambulatory Visit: Payer: Self-pay

## 2020-08-01 ENCOUNTER — Ambulatory Visit (INDEPENDENT_AMBULATORY_CARE_PROVIDER_SITE_OTHER): Payer: Self-pay | Admitting: Pulmonary Disease

## 2020-08-01 DIAGNOSIS — R0609 Other forms of dyspnea: Secondary | ICD-10-CM

## 2020-08-01 DIAGNOSIS — R0600 Dyspnea, unspecified: Secondary | ICD-10-CM

## 2020-08-01 LAB — PULMONARY FUNCTION TEST
DL/VA % pred: 113 %
DL/VA: 4.96 ml/min/mmHg/L
DLCO cor % pred: 63 %
DLCO cor: 17.67 ml/min/mmHg
DLCO unc % pred: 63 %
DLCO unc: 17.72 ml/min/mmHg
FEF 25-75 Post: 3.31 L/sec
FEF 25-75 Pre: 3.12 L/sec
FEF2575-%Change-Post: 6 %
FEF2575-%Pred-Post: 107 %
FEF2575-%Pred-Pre: 101 %
FEV1-%Change-Post: 3 %
FEV1-%Pred-Post: 85 %
FEV1-%Pred-Pre: 82 %
FEV1-Post: 2.69 L
FEV1-Pre: 2.6 L
FEV1FVC-%Change-Post: 3 %
FEV1FVC-%Pred-Pre: 104 %
FEV6-%Change-Post: 0 %
FEV6-%Pred-Post: 81 %
FEV6-%Pred-Pre: 80 %
FEV6-Post: 3.12 L
FEV6-Pre: 3.11 L
FEV6FVC-%Change-Post: 0 %
FEV6FVC-%Pred-Post: 103 %
FEV6FVC-%Pred-Pre: 102 %
FVC-%Change-Post: 0 %
FVC-%Pred-Post: 78 %
FVC-%Pred-Pre: 78 %
FVC-Post: 3.13 L
FVC-Pre: 3.14 L
Post FEV1/FVC ratio: 86 %
Post FEV6/FVC ratio: 100 %
Pre FEV1/FVC ratio: 83 %
Pre FEV6/FVC Ratio: 99 %
RV % pred: 32 %
RV: 0.66 L
TLC % pred: 55 %
TLC: 3.75 L

## 2020-08-01 NOTE — Progress Notes (Signed)
PFT done today. 

## 2020-09-04 ENCOUNTER — Other Ambulatory Visit: Payer: Self-pay | Admitting: Physician Assistant

## 2020-09-04 ENCOUNTER — Other Ambulatory Visit: Payer: Self-pay | Admitting: Cardiovascular Disease

## 2020-09-04 MED ORDER — ATORVASTATIN CALCIUM 80 MG PO TABS: 80.0000 mg | ORAL_TABLET | Freq: Every day | ORAL | 2 refills | Status: DC

## 2020-09-04 MED FILL — METOPROLOL SUCCINATE ER 25: 25 | 30 days supply | Qty: 60 | Fill #0

## 2020-09-04 MED FILL — PRASUGREL HCL 10 MG TABS: 10 | 30 days supply | Qty: 30 | Fill #5

## 2020-09-04 MED FILL — ATORVASTATIN CALCIUM 80 MG: 80 | 30 days supply | Qty: 30 | Fill #0

## 2020-09-04 MED FILL — ISOSORBIDE MN ER 30 MG TAB: 30 | 30 days supply | Qty: 15 | Fill #3

## 2020-09-04 NOTE — Telephone Encounter (Signed)
Pt's medications were sent to pt's pharmacy as requested. Confirmation received.  

## 2020-09-04 NOTE — Telephone Encounter (Signed)
°*  STAT* If patient is at the pharmacy, call can be transferred to refill team.   1. Which medications need to be refilled? (please list name of each medication and dose if known)  atorvastatin (LIPITOR) 80 MG tablet [[id number]] metoprolol succinate (TOPROL XL) 25 MG 24 hr tablet   2. Which pharmacy/location (including street and city if local pharmacy) is medication to be sent to?  [facility], [address]  [place], [place] [address]  Phone:  [phone] Fax:  [phone]   3. Do they need a 30 day or 90 day supply? 52 Pt is due to fu in April

## 2020-09-04 NOTE — Telephone Encounter (Signed)
Will forward to refills

## 2020-10-07 MED FILL — ATORVASTATIN CALCIUM 80 MG: 80 | 30 days supply | Qty: 30 | Fill #1

## 2020-10-07 MED FILL — ISOSORBIDE MN ER 30 MG TAB: 30 | 30 days supply | Qty: 15 | Fill #4

## 2020-10-08 ENCOUNTER — Other Ambulatory Visit: Payer: Self-pay

## 2020-10-08 ENCOUNTER — Encounter: Payer: Self-pay | Admitting: Pulmonary Disease

## 2020-10-08 ENCOUNTER — Ambulatory Visit (INDEPENDENT_AMBULATORY_CARE_PROVIDER_SITE_OTHER): Payer: Self-pay | Admitting: Pulmonary Disease

## 2020-10-08 VITALS — BP 130/74 | HR 75 | Temp 97.8°F | Ht 66.0 in | Wt 232.6 lb

## 2020-10-08 DIAGNOSIS — R0600 Dyspnea, unspecified: Secondary | ICD-10-CM

## 2020-10-08 DIAGNOSIS — R0609 Other forms of dyspnea: Secondary | ICD-10-CM

## 2020-10-08 MED ORDER — ANORO ELLIPTA 62.5-25 MCG/INH IN AEPB: 1.0000 | INHALATION_SPRAY | Freq: Every day | RESPIRATORY_TRACT | 0 refills | Status: DC

## 2020-10-08 MED FILL — PRASUGREL HCL 10 MG TABS: 10 | 30 days supply | Qty: 30 | Fill #6

## 2020-10-08 NOTE — Progress Notes (Signed)
@Patient  ID: Jeffrey Winters, male    DOB: Mar 17, 1966, 55 y.o.   MRN: [id number]  Chief Complaint  Patient presents with  . Follow-up    3 month f/u for DOE. Completed PFTs back in December 2021. States his breathing has been stable since last visit. Increased coughing but has not been able to cough up anything.     Referring provider: Lorretta Harp, MD  HPI:   55 year old with significant coronary disease status post multiple MIs and PCI's whom we are seeing in follow up of DOE.  Symptoms stable. Albuterol not helpful Still with moderate DOE with long walks, stairs, inclines. No cough. Lungs clear. No allergies, atopic symptoms.   HPI at initial visit: Patient notes shortness of breath for some time.  Made worse over the last several months.  No shortness of breath at rest but with exertion prickly going up inclines or stairs he gets short winded and needs to stop.  With rest his symptoms improved.  He has a nonproductive cough but is not too bothersome to him.  No weight loss, fever, chills.  He denies any frank chest pain or chest pressure.  No orthopnea or PND.  No chest images during the work-up of these complaints.  He follows with cardiology and most recent heart attack was 07/2019 with stent to RCA.  No follow-up echocardiogram seen in the system.  These also been followed by cardiology for peripheral arterial disease and stents lower extremity arteries.  He has ongoing complaints in his legs but it does seem the claudication is improved somewhat.  In terms of his dyspnea exertion, no other exacerbating alleviating factors.  No time of day worse worse.  No clear timing in terms of onset with acute event or otherwise readily identifiable trigger.  PMH: Coronary artery disease, hypertension, sleep apnea Surgical history: Multiple PCI's Family history: Emphysema, heart disease, cancer in first-degree relatives Social history: Smoking half a pack per day, down from 1 pack a day,  approximate 7-pack-year smoking history, now retired, worked in [employer] for some time  Licensed conveyancer / Pulmonary Flowsheets:   ACT:  No flowsheet data found.  MMRC: mMRC Dyspnea Scale mMRC Score  10/08/2020 2    [place]worth:  No flowsheet data found.  Tests:   FENO:  No results found for: NITRICOXIDE  PFT: PFT Results Latest Ref Rng & Units 08/01/2020  FVC-Pre L 3.14  FVC-Predicted Pre % 78  FVC-Post L 3.13  FVC-Predicted Post % 78  Pre FEV1/FVC % % 83  Post FEV1/FCV % % 86  FEV1-Pre L 2.60  FEV1-Predicted Pre % 82  FEV1-Post L 2.69  DLCO uncorrected ml/min/mmHg 17.72  DLCO UNC% % 63  DLCO corrected ml/min/mmHg 17.67  DLCO COR %Predicted % 63  DLVA Predicted % 113  TLC L 3.75  TLC % Predicted % 55  RV % Predicted % 32    WALK:  No flowsheet data found.  Imaging: No results found.  Lab Results: Personally reviewed CBC    Component Value Date/Time   WBC 4.2 07/04/2020 1412   RBC 5.16 07/04/2020 1412   HGB 14.7 07/04/2020 1412   HGB 14.2 11/07/2019 1432   HCT 44.2 07/04/2020 1412   HCT 43.1 11/07/2019 1432   PLT 168 07/04/2020 1412   PLT 178 11/07/2019 1432   MCV 85.7 07/04/2020 1412   MCV 83 11/07/2019 1432   MCH 28.5 07/04/2020 1412   MCHC 33.3 07/04/2020 1412   RDW 13.5 07/04/2020 1412  RDW 13.9 11/07/2019 1432    BMET    Component Value Date/Time   NA 138 07/04/2020 1412   NA 142 11/07/2019 1432   K 4.1 07/04/2020 1412   CL 102 07/04/2020 1412   CO2 26 07/04/2020 1412   GLUCOSE 118 (H) 07/04/2020 1412   BUN 13 07/04/2020 1412   BUN 14 11/07/2019 1432   CREATININE 1.06 07/04/2020 1412   CALCIUM 9.1 07/04/2020 1412   GFRNONAA >60 07/04/2020 1412   GFRAA 94 11/07/2019 1432    BNP No results found for: BNP  ProBNP    Component Value Date/Time   PROBNP 233 (H) 07/21/2019 1120    Specialty Problems   None     No Known Allergies   There is no immunization history on file for this patient.  Past Medical History:   Diagnosis Date  . Coronary artery disease 2017   MI s/p PCI to OM2 // s/p MI 07/2019 >> DES to RCA; residual RPL2 80 // Myoview 08/2019: EF 61, small area of mod ischemia inf base; Low Risk  . Hypertension   . PAD (peripheral artery disease) ([place])    LE Art [place] 08/2019: R SFA 50-74; L SFA 50-74    Tobacco History: Social History   Tobacco Use  Smoking Status Current Every Day Smoker  . Packs/day: 1.00  . Years: 7.00  . Pack years: 7.00  . Types: Cigarettes  Smokeless Tobacco Never Used  Tobacco Comment   smokes 1/2 pack per day 06/19/2020   Ready to quit: Not Answered Counseling given: Not Answered Comment: smokes 1/2 pack per day 06/19/2020   Continue to not smoke  Outpatient Encounter Medications as of 10/08/2020  Medication Sig  . albuterol (VENTOLIN HFA) 108 (90 Base) MCG/ACT inhaler Inhale 1-2 puffs into the lungs every 6 (six) hours as needed for wheezing or shortness of breath.  Marland Kitchen aspirin 81 MG chewable tablet Chew 1 tablet (81 mg total) by mouth daily.  Marland Kitchen atorvastatin (LIPITOR) 80 MG tablet Take 1 tablet (80 mg total) by mouth daily at 6 PM.  . diphenhydramine-acetaminophen (TYLENOL PM) 25-500 MG TABS tablet Take 0.5 tablets by mouth at bedtime.  . famotidine (PEPCID) 20 MG tablet Take 1 tablet (20 mg total) by mouth 2 (two) times daily.  . isosorbide mononitrate (IMDUR) 30 MG 24 hr tablet TAKE 1/2 TABLET BY MOUTH DAILY. (Patient taking differently: Take 15 mg by mouth in the morning.)  . metoprolol succinate (TOPROL-XL) 25 MG 24 hr tablet TAKE 1 TABLET BY MOUTH DAILY. MAY TAKE 1 ADDITIONAL TABLET AS NEEDED FOR PALPITATIONS.  Marland Kitchen nitroGLYCERIN (NITROSTAT) 0.4 MG SL tablet Place 1 tablet (0.4 mg total) under the tongue every 5 (five) minutes as needed for chest pain.  . prasugrel (EFFIENT) 10 MG TABS tablet Take 1 tablet (10 mg total) by mouth daily.   No facility-administered encounter medications on file as of 10/08/2020.     Review of Systems  Review of Systems  No  seasonal allergies.  No history of breathing issues as a younger man or child.  Started smoking just a few years ago.  Comprehensive review of systems otherwise negative.  Physical Exam  BP 130/74   Pulse 75   Temp 97.8 F (36.6 C) (Temporal)   Ht 5\' 6"  (1.676 m)   Wt 232 lb 9.6 oz (105.5 kg)   SpO2 99% Comment: on RA  BMI 37.54 kg/m   Wt Readings from Last 5 Encounters:  10/08/20 232 lb 9.6 oz (105.5  kg)  07/04/20 222 lb (100.7 kg)  07/04/20 222 lb (100.7 kg)  06/19/20 222 lb (100.7 kg)  06/13/20 223 lb (101.2 kg)    BMI Readings from Last 5 Encounters:  10/08/20 37.54 kg/m  07/04/20 35.83 kg/m  07/04/20 35.83 kg/m  06/19/20 35.83 kg/m  06/13/20 35.99 kg/m     Physical Exam General: Well-appearing, no distress Eyes: EOMI, no icterus Neck: Supple, no JVP appreciated Respiratory: Clear to auscultation bilaterally, no wheeze Cardiovascular: Regular rhythm, no murmurs Abdomen: Nondistended, bowel sounds present MSK: No synovitis, no joint effusion Neuro: Normal gait, no weakness Psych: Normal mood, full affect   Assessment & Plan:   Dyspnea on exertion: Possibly multifactorial.  Do believe that significant contribution from underlying coronary disease, possible anginal equivalent.  He only smoked for 7 years or so.  PFT show now obstruction. Low suspicion for asthma. TLC with moderate restriction - lungs are clear, CXR clear, low suspicion for ILD. Suspect this is an erroneous measurement given lack of sign on physical exam, normal CXR, normal FVC or spirometry. Consider CT chest if not imrpoving in future. Asked he re-evaluate with cardiologist. Trial Anoro to see if helps - albuterol has not.    Return in about 3 months (around 01/08/2021).   Lanier Clam, MD 10/08/2020

## 2020-10-08 NOTE — Addendum Note (Signed)
Addended by: Valerie Salts on: 10/08/2020 12:20 PM   Modules accepted: Orders

## 2020-10-08 NOTE — Patient Instructions (Signed)
Nice to see you again  Your breathing test showed no COPD.  The lung volumes were a bit low which is hard to explain given normal values otherwise.  I suspect this is an inaccurate or wrong measurement.  However, a CT scan of the chest with rule out most things that would contribute to this.  We will not pursue this at this time but we must consider this at next visit if the new inhaler does not help.  Use Anoro 1 puff daily.  This has to medicine similar to albuterol but last much longer.  I am hopeful this will help.  Come back in 3 months for follow-up with Dr. Silas Flood.

## 2020-11-06 ENCOUNTER — Other Ambulatory Visit: Payer: Self-pay

## 2020-11-06 MED FILL — Atorvastatin Calcium Tab 80 MG (Base Equivalent): ORAL | 30 days supply | Qty: 30 | Fill #0 | Status: AC

## 2020-11-06 MED FILL — Isosorbide Mononitrate Tab ER 24HR 30 MG: ORAL | 30 days supply | Qty: 15 | Fill #0 | Status: AC

## 2020-11-06 MED FILL — Metoprolol Succinate Tab ER 24HR 25 MG (Tartrate Equiv): ORAL | 30 days supply | Qty: 60 | Fill #0 | Status: AC

## 2020-11-07 ENCOUNTER — Other Ambulatory Visit: Payer: Self-pay

## 2020-11-07 MED FILL — Prasugrel HCl Tab 10 MG (Base Equiv): ORAL | 30 days supply | Qty: 30 | Fill #0 | Status: AC

## 2020-11-08 ENCOUNTER — Other Ambulatory Visit: Payer: Self-pay

## 2020-11-10 NOTE — Progress Notes (Addendum)
Cardiology Office Note:    Date:  11/11/2020   ID:  Jeffrey Winters, DOB 1966-03-17, MRN [id number]  PCP:  Lorretta Harp, [place]  Cardiologist:  Sherren Mocha, MD   Advanced Practice Provider:  Liliane Shi, PA-C Electrophysiologist:  None  PV: Quay Burow, MD      Referring MD: Lorretta Harp, MD   Chief Complaint:  Follow-up (CAD)    Patient Profile:     Jeffrey Winters is a 55 y.o. male with:   Coronary artery disease ? S/p NSTEMI in 02/2016 tx with Xience DES to OM1 ? s/p NSTEMI in 05/2016 ? S/p Inf STEMI 07/2019 >> s/p DES to RCA; OM1 stent patent  Residual RPL2 80% >> Med Rx  Myoview 08/2019: small inf ischemia; low risk >> med Rx continued  Peripheral Arterial Disease ? S/p L SFA PTA 10/2019 ? S/p R SFA PTA 11/2019  Hypertension   Hyperlipidemia   Tobacco use  Prior CV studies: Myoview 08/25/2019 EF 61, small area of inferior ischemia; low risk  LE Art [place] 08/2019:  R SFA 50-74; L SFA 50-74  Echocardiogram 07/11/2019 EF 55, mild LVH, inf HK, Gr 2 DD, normal RVSF, trace MR, trivial TR, RVSP 36.3  Cardiac catheterization 07/10/2019 LAD mid 55, dist 40 LCx irregs; OM2 stent patent RCA mid 100, dist 50; RPL2 80 PCI: 3 x 38 mm Resolute Onyx DES to mid RCA  Cardiac catheterization 02/26/16 ([facility]) 2. 2.5 x 18 mm Xience drug eluting stent to the OM2       History of Present Illness:    Mr. Burdi was last seen in 10/21.  He has been seen by pulmonology for shortness of breath.  PFTs did not show significant findings.  Dr. Silas Flood recommended f/u with Cardiology (?shortness of breath >>anginal equivalent).  He is here alone today.  He has not had any severe chest pain.  He has occasional chest symptoms from time to time.  He does ride a bike.  He has not really had significant shortness of breath when riding his bike.  He sometimes feels short of breath if he rides a long distance.  He does feel like  the inhaled med changes with pulmonology has helped.  Overall, he has not had any significant worsening in his symptoms. He has not had orthopnea, syncope, lower ext edema.           Past Medical History:  Diagnosis Date  . Coronary artery disease 2017   MI s/p PCI to OM2 // s/p MI 07/2019 >> DES to RCA; residual RPL2 80 // Myoview 08/2019: EF 61, small area of mod ischemia inf base; Low Risk  . Hypertension   . PAD (peripheral artery disease) ()    LE Art [place] 08/2019: R SFA 50-74; L SFA 50-74    Current Medications: Current Meds  Medication Sig  . albuterol (VENTOLIN HFA) 108 (90 Base) MCG/ACT inhaler Inhale 1-2 puffs into the lungs every 6 (six) hours as needed for wheezing or shortness of breath.  Marland Kitchen aspirin 81 MG chewable tablet Chew 1 tablet (81 mg total) by mouth daily.  Marland Kitchen atorvastatin (LIPITOR) 80 MG tablet Take 1 tablet (80 mg total) by mouth daily at 6 PM.  . clopidogrel (PLAVIX) 75 MG tablet Take 1 tablet (75 mg total) by mouth daily.  . diphenhydramine-acetaminophen (TYLENOL PM) 25-500 MG TABS tablet Take 0.5 tablets by mouth at bedtime.  . famotidine (PEPCID) 20  MG tablet Take 1 tablet (20 mg total) by mouth 2 (two) times daily.  . isosorbide mononitrate (IMDUR) 30 MG 24 hr tablet TAKE 1/2 TABLET BY MOUTH DAILY.  . metoprolol succinate (TOPROL-XL) 25 MG 24 hr tablet TAKE 1 TABLET BY MOUTH DAILY. MAY TAKE 1 ADDITIONAL TABLET AS NEEDED FOR PALPITATIONS.  Marland Kitchen nitroGLYCERIN (NITROSTAT) 0.4 MG SL tablet Place 1 tablet (0.4 mg total) under the tongue every 5 (five) minutes as needed for chest pain.  Marland Kitchen umeclidinium-vilanterol (ANORO ELLIPTA) 62.5-25 MCG/INH AEPB Inhale 1 puff into the lungs daily.  . [DISCONTINUED] prasugrel (EFFIENT) 10 MG TABS tablet Take 1 tablet (10 mg total) by mouth daily.     Allergies:   Patient has no known allergies.   Social History   Tobacco Use  . Smoking status: Current Every Day Smoker    Packs/day: 1.00    Years: 7.00    Pack years: 7.00     Types: Cigarettes  . Smokeless tobacco: Never Used  . Tobacco comment: smokes 1/2 pack per day 06/19/2020  Substance Use Topics  . Alcohol use: Not Currently  . Drug use: No     Family Hx: The patient's family history includes Heart disease in his father and mother.  ROS   EKGs/Labs/Other Test Reviewed:    EKG:  EKG is not ordered today.  The ekg ordered today demonstrates n/a  Recent Labs: 07/04/2020: BUN 13; Creatinine, Ser 1.06; Hemoglobin 14.7; Platelets 168; Potassium 4.1; Sodium 138   Recent Lipid Panel Lab Results  Component Value Date/Time   CHOL 115 11/07/2019 02:32 PM   TRIG 117 11/07/2019 02:32 PM   HDL 35 (L) 11/07/2019 02:32 PM   CHOLHDL 3.3 11/07/2019 02:32 PM   CHOLHDL 6.3 07/10/2019 02:00 PM   LDLCALC 59 11/07/2019 02:32 PM      Risk Assessment/Calculations:      Physical Exam:    VS:  BP 110/60   Pulse 84   Ht '5\' 6"'  (1.676 m)   Wt 231 lb 9.6 oz (105.1 kg)   SpO2 97%   BMI 37.38 kg/m     Wt Readings from Last 3 Encounters:  11/11/20 231 lb 9.6 oz (105.1 kg)  10/08/20 232 lb 9.6 oz (105.5 kg)  07/04/20 222 lb (100.7 kg)     Constitutional:      Appearance: Healthy appearance. Not in distress.  Neck:     Vascular: No JVR. JVD normal.  Pulmonary:     Effort: Pulmonary effort is normal.     Breath sounds: No wheezing. No rales.  Cardiovascular:     Normal rate. Regular rhythm. Normal S1. Normal S2.     Murmurs: There is no murmur.  Edema:    Peripheral edema absent.  Abdominal:     Palpations: Abdomen is soft. There is no hepatomegaly.  Skin:    General: Skin is warm and dry.  Neurological:     General: No focal deficit present.     Mental Status: Alert and oriented to person, place and time.     Cranial Nerves: Cranial nerves are intact.         ASSESSMENT & PLAN:    1. Coronary artery disease involving native coronary artery of native heart with angina pectoris [facility]) s/p myocardial infarctionin2017 treated with DES to the  OM1 at [facility]. He is status post inferior STEMI in 07/2019 treated with a DES to the RCA. He has residual stenosis with 80% in the RPL 2. Myoview in 08/2019  demonstrated a small amount of inferior ischemia.  He is managed medically.  He has seen pulmonology and his PFTs were really not that abnormal.  Overall, he feels better on inhaled medications.  He has not had any progressive worsening.  I reviewed his case earlier today with [name] prior to his appt.  Since he is fairly stable, we will continue med Rx.  If he has worsening symptoms, we will likely proceed with cardiac catheterization.  Continue ASA, statin, beta-blocker, nitrate.  He is > 1 year out from ACS.  He has multiple stents.  I think he should remain on DAPT long term.  DC Effient.  Start Plavix 75 mg once daily. F/u 6 mos.   2. PAD (peripheral artery disease) ([place]) He has some tingling in his feet with prolonged sitting.  But, he is not having claudication.  F/u with Dr. Gwenlyn Found as planned.   3. Essential hypertension The patient's blood pressure is controlled on his current regimen.  Continue current therapy.    4. Mixed hyperlipidemia Continue high intensity statin Rx.  Arrange CMET, Lipids.    Dispo:  Return in about 6 months (around 05/13/2021) for Routine 6 month follow up with [name] or [name], PA. .   Medication Adjustments/Labs and Tests Ordered: Current medicines are reviewed at length with the patient today.  Concerns regarding medicines are outlined above.  Tests Ordered: Orders Placed This Encounter  Procedures  . Comp Met (CMET)  . Lipid Profile   Medication Changes: Meds ordered this encounter  Medications  . clopidogrel (PLAVIX) 75 MG tablet    Sig: Take 1 tablet (75 mg total) by mouth daily.    Dispense:  30 tablet    Refill:  [address], [name], [place]  11/11/2020 4:25 PM    [facility] [place], [place], [place]  [address] Phone: [phone]; Fax: [phone]

## 2020-11-11 ENCOUNTER — Other Ambulatory Visit: Payer: Self-pay

## 2020-11-11 ENCOUNTER — Encounter: Payer: Self-pay | Admitting: Physician Assistant

## 2020-11-11 ENCOUNTER — Ambulatory Visit (INDEPENDENT_AMBULATORY_CARE_PROVIDER_SITE_OTHER): Payer: Self-pay | Admitting: Physician Assistant

## 2020-11-11 VITALS — BP 110/60 | HR 84 | Ht 66.0 in | Wt 231.6 lb

## 2020-11-11 DIAGNOSIS — I739 Peripheral vascular disease, unspecified: Secondary | ICD-10-CM

## 2020-11-11 DIAGNOSIS — E782 Mixed hyperlipidemia: Secondary | ICD-10-CM

## 2020-11-11 DIAGNOSIS — I10 Essential (primary) hypertension: Secondary | ICD-10-CM

## 2020-11-11 DIAGNOSIS — R0602 Shortness of breath: Secondary | ICD-10-CM

## 2020-11-11 DIAGNOSIS — I25119 Atherosclerotic heart disease of native coronary artery with unspecified angina pectoris: Secondary | ICD-10-CM

## 2020-11-11 MED ORDER — CLOPIDOGREL BISULFATE 75 MG PO TABS
75.0000 mg | ORAL_TABLET | Freq: Every day | ORAL | 11 refills | Status: DC
Start: 2020-11-11 — End: 2021-07-09
  Filled 2020-11-11 – 2021-02-11 (×2): qty 30, 30d supply, fill #0

## 2020-11-11 NOTE — Patient Instructions (Signed)
Medication Instructions:  Your physician has recommended you make the following change in your medication:   1. Discontinue Effient 2.. Start Plavix one tablet by mouth ( 75 mg) daily # 30 at requested pharmacy.   *If you need a refill on your cardiac medications before your next appointment, please call your pharmacy*   Lab Work: Your physician recommends that you return for a FASTING lipid profile/CMET on Wednesday, May 4 between 7:30 - 4:30 fasting from 12 the night before  If you have labs (blood work) drawn today and your tests are completely normal, you will receive your results only by: Marland Kitchen MyChart Message (if you have MyChart) OR . A paper copy in the mail If you have any lab test that is abnormal or we need to change your treatment, we will call you to review the results.   Testing/Procedures: -None   Follow-Up: At [facility], you and your health needs are our priority.  As part of our continuing mission to provide you with exceptional heart care, we have created designated Provider Care Teams.  These Care Teams include your primary Cardiologist (physician) and Advanced Practice Providers (APPs -  Physician Assistants and Nurse Practitioners) who all work together to provide you with the care you need, when you need it.  We recommend signing up for the patient portal called "MyChart".  Sign up information is provided on this After Visit Summary.  MyChart is used to connect with patients for Virtual Visits (Telemedicine).  Patients are able to view lab/test results, encounter notes, upcoming appointments, etc.  Non-urgent messages can be sent to your provider as well.   To learn more about what you can do with MyChart, go to [email or web address].    Your next appointment:   6 month(s)  The format for your next appointment:   In Person  Provider:   You may see Sherren Mocha, MD or one of the following Advanced Practice Providers on your designated Care Team:    [name], PA-C   Other Instructions Your physician wants you to follow-up in: 6 months with Dr.Cooper or [name], PA.  You will receive a reminder letter in the mail two months in advance. If you don't receive a letter, please call our office to schedule the follow-up appointment.

## 2020-11-18 ENCOUNTER — Other Ambulatory Visit: Payer: Self-pay

## 2020-11-20 ENCOUNTER — Ambulatory Visit (INDEPENDENT_AMBULATORY_CARE_PROVIDER_SITE_OTHER): Payer: Self-pay | Admitting: Cardiovascular Disease

## 2020-11-20 ENCOUNTER — Other Ambulatory Visit: Payer: Self-pay

## 2020-11-20 ENCOUNTER — Encounter: Payer: Self-pay | Admitting: Cardiovascular Disease

## 2020-11-20 VITALS — BP 122/78 | HR 86 | Ht 68.0 in | Wt 232.2 lb

## 2020-11-20 DIAGNOSIS — I2111 ST elevation (STEMI) myocardial infarction involving right coronary artery: Secondary | ICD-10-CM

## 2020-11-20 DIAGNOSIS — I739 Peripheral vascular disease, unspecified: Secondary | ICD-10-CM

## 2020-11-20 DIAGNOSIS — Z720 Tobacco use: Secondary | ICD-10-CM

## 2020-11-20 NOTE — Assessment & Plan Note (Signed)
Ongoing tobacco abuse of just under a pack a day recalcitrant to risk factor modification.

## 2020-11-20 NOTE — Patient Instructions (Signed)
Medication Instructions:  Your physician recommends that you continue on your current medications as directed. Please refer to the Current Medication list given to you today.  *If you need a refill on your cardiac medications before your next appointment, please call your pharmacy*   Testing/Procedures: Your physician has requested that you have a lower extremity arterial duplex. This test is an ultrasound of the arteries in the legs. It looks at arterial blood flow in the. Allow one hour for Lower Arterial scans. There are no restrictions or special instructions  Your physician has requested that you have an ankle brachial index (ABI). During this test an ultrasound and blood pressure cuff are used to evaluate the arteries that supply the arms and legs with blood. Allow thirty minutes for this exam. There are no restrictions or special instructions.  These procedures are done at [place]. 2nd Floor. To be done in Oct. 2022.   Follow-Up: At [facility], you and your health needs are our priority.  As part of our continuing mission to provide you with exceptional heart care, we have created designated Provider Care Teams.  These Care Teams include your primary Cardiologist (physician) and Advanced Practice Providers (APPs -  Physician Assistants and Nurse Practitioners) who all work together to provide you with the care you need, when you need it.  We recommend signing up for the patient portal called "MyChart".  Sign up information is provided on this After Visit Summary.  MyChart is used to connect with patients for Virtual Visits (Telemedicine).  Patients are able to view lab/test results, encounter notes, upcoming appointments, etc.  Non-urgent messages can be sent to your provider as well.   To learn more about what you can do with MyChart, go to [email or web address].    Your next appointment:   12 month(s)  The format for your next appointment:   In Person  Provider:    Quay Burow, MD

## 2020-11-20 NOTE — Assessment & Plan Note (Signed)
History of CAD status post non-STEMI treated with distal RCA intervention as well as obtuse marginal branch intervention at [facility] 02/26/2016.

## 2020-11-20 NOTE — Assessment & Plan Note (Signed)
History of PAD status post left SFA [place] 1 directional atherectomy followed by [facility] and absolute Pro stenting 10/05/2019.  He underwent staged shockwave IV L4/8/21 followed by [facility] with an excellent result.  His claudication has resolved.  Dopplers performed 05/21/2020 revealed ABIs of greater than 1 bilaterally with a widely patent left SFA stent.  These will be repeated on annual basis.

## 2020-11-20 NOTE — Progress Notes (Signed)
11/20/2020 Jeffrey Winters   1966/04/24  [id number]  Primary Physician Lorretta Harp, MD Primary Cardiologist: Lorretta Harp MD FACP, [place], [place], [place]  HPI:  Jeffrey Winters is a 55 y.o.  mild to moderately overweight single African-American male with no children referred to me by [name], PA-C for treatment of symptomatic PAD. He does not have a primary care provider.I last saw him in the office  11/24/2019. 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 There is no family history of heart disease. He is never had a stroke. He had a non-STEMI in December and had treatment of his distal RCA. He had stenting of his distal RCA and obtuse marginal branch at [facility] 02/26/2016 as well. He does complain of left Solomon claudication at 50 to 75 feet which is symmetric bilaterally. He had Doppler studies performed 08/09/2019 revealing normal ABIs with moderate disease in both SFAs.  I performed peripheral angiography on him 10/05/2019 revealing high-grade fairly focal mid bilateral SFA stenoses. I performed [facility] 1 directional atherectomy followed by drug-coated balloon angioplasty of the mid left SFA with an excellent result. His Dopplers normalized and his left calf claudication has resolved.   I performed right SFA intervention on him 11/09/2019 using intravascular lithotripsy followed by drug-coated balloon angioplasty.  His follow-up Doppler studies performed 11/16/2019 showed no change in the frequency mid right SFA or change in his ABIs although clinically he is improved.  Unfortunately, he does continue to smoke a half a pack a day.  Since I saw him a year ago he continues to do well.  He is still denies claudication chest pain or shortness of breath.   Dopplers performed 05/21/2020 revealed ABIs are greater than 1 bilaterally with widely patent SFAs.      Current Meds  Medication Sig  . albuterol (VENTOLIN HFA) 108 (90 Base) MCG/ACT inhaler INHALE 1-2 PUFFS INTO THE LUNGS EVERY 6 (SIX) HOURS AS NEEDED FOR WHEEZING OR SHORTNESS OF BREATH.  Marland Kitchen aspirin 81 MG chewable tablet Chew 1 tablet (81 mg total) by mouth daily.  Marland Kitchen atorvastatin (LIPITOR) 80 MG tablet TAKE 1 TABLET (80 MG TOTAL) BY MOUTH DAILY AT 6 PM. (Patient taking differently: Take 80 mg by mouth daily. Take 1 tablet by mouth daily at [facility])  . clopidogrel (PLAVIX) 75 MG tablet Take 1 tablet (75 mg total) by mouth daily.  . diphenhydramine-acetaminophen (TYLENOL PM) 25-500 MG TABS tablet Take 0.5 tablets by mouth at bedtime.  . famotidine (PEPCID) 20 MG tablet Take 1 tablet (20 mg total) by mouth 2 (two) times daily.  . isosorbide mononitrate (IMDUR) 30 MG 24 hr tablet TAKE 1/2 TABLET BY MOUTH DAILY.  . metoprolol succinate (TOPROL-XL) 25 MG 24 hr tablet TAKE 1 TABLET BY MOUTH DAILY. MAY TAKE 1 ADDITIONAL TABLET AS NEEDED FOR PALPITATIONS.  Marland Kitchen nitroGLYCERIN (NITROSTAT) 0.4 MG SL tablet PLACE 1 TABLET (0.4 MG TOTAL) UNDER THE TONGUE EVERY 5 (FIVE) MINUTES AS NEEDED FOR CHEST PAIN.  Marland Kitchen prasugrel (EFFIENT) 10 MG TABS tablet TAKE 1 TABLET (10 MG TOTAL) BY MOUTH DAILY.  Marland Kitchen umeclidinium-vilanterol (ANORO ELLIPTA) 62.5-25 MCG/INH AEPB Inhale 1 puff into the lungs daily.     No Known Allergies  Social History   Socioeconomic History  . Marital status: Single  Spouse name: Not on file  . Number of children: Not on file  . Years of education: Not on file  . Highest education level: Not on file  Occupational History  . Not on file  Tobacco Use  . Smoking status: Current Every Day Smoker    Packs/day: 1.00    Years: 7.00    Pack years: 7.00    Types: Cigarettes  . Smokeless tobacco: Never Used  . Tobacco comment: smokes 1/2 pack per day 06/19/2020  Substance and Sexual Activity  .  Alcohol use: Not Currently  . Drug use: No  . Sexual activity: Not on file  Other Topics Concern  . Not on file  Social History Narrative  . Not on file   Social Determinants of Health   Financial Resource Strain: Not on file  Food Insecurity: Not on file  Transportation Needs: Not on file  Physical Activity: Not on file  Stress: Not on file  Social Connections: Not on file  Intimate Partner Violence: Not on file     Review of Systems: General: negative for chills, fever, night sweats or weight changes.  Cardiovascular: negative for chest pain, dyspnea on exertion, edema, orthopnea, palpitations, paroxysmal nocturnal dyspnea or shortness of breath Dermatological: negative for rash Respiratory: negative for cough or wheezing Urologic: negative for hematuria Abdominal: negative for nausea, vomiting, diarrhea, bright red blood per rectum, melena, or hematemesis Neurologic: negative for visual changes, syncope, or dizziness All other systems reviewed and are otherwise negative except as noted above.    Blood pressure 122/78, pulse 86, height 5\' 8"  (1.727 m), weight 232 lb 3.2 oz (105.3 kg), SpO2 97 %.  General appearance: alert and no distress Neck: no adenopathy, no carotid bruit, no JVD, supple, symmetrical, trachea midline and thyroid not enlarged, symmetric, no tenderness/mass/nodules Lungs: clear to auscultation bilaterally Heart: regular rate and rhythm, S1, S2 normal, no murmur, click, rub or gallop Extremities: extremities normal, atraumatic, no cyanosis or edema Pulses: 2+ and symmetric Skin: Skin color, texture, turgor normal. No rashes or lesions Neurologic: Alert and oriented X 3, normal strength and tone. Normal symmetric reflexes. Normal coordination and gait  EKG not performed today  ASSESSMENT AND PLAN:   STEMI (ST elevation myocardial infarction) ([place]) History of CAD status post non-STEMI treated with distal RCA intervention as well as obtuse marginal branch  intervention at [facility] 02/26/2016.  Hyperlipidemia History of hyperlipidemia on statin therapy with lipid profile performed 11/07/2019 revealing total cholesterol of 115, LDL of 59 HDL 35.  PAD (peripheral artery disease) ([facility]) History of PAD status post left SFA [place] 1 directional atherectomy followed by [facility] and absolute Pro stenting 10/05/2019.  He underwent staged shockwave IV L4/8/21 followed by [facility] with an excellent result.  His claudication has resolved.  Dopplers performed 05/21/2020 revealed ABIs of greater than 1 bilaterally with a widely patent left SFA stent.  These will be repeated on annual basis.  Tobacco abuse Ongoing tobacco abuse of just under a pack a day recalcitrant to risk factor modification.      Lorretta Harp MD FACP,FACC,FAHA, [facility] 11/20/2020 4:23 PM

## 2020-11-20 NOTE — Assessment & Plan Note (Signed)
History of hyperlipidemia on statin therapy with lipid profile performed 11/07/2019 revealing total cholesterol of 115, LDL of 59 HDL 35.

## 2020-11-24 ENCOUNTER — Other Ambulatory Visit: Payer: Self-pay

## 2020-11-24 ENCOUNTER — Emergency Department (HOSPITAL_COMMUNITY): Payer: Medicaid Other

## 2020-11-24 ENCOUNTER — Inpatient Hospital Stay (HOSPITAL_COMMUNITY)
Admission: EM | Admit: 2020-11-24 | Discharge: 2020-11-26 | DRG: 603 | Disposition: A | Discharge status: Home / Self Care | Payer: Medicaid Other | Attending: Internal Medicine | Admitting: Internal Medicine

## 2020-11-24 DIAGNOSIS — I739 Peripheral vascular disease, unspecified: Secondary | ICD-10-CM | POA: Diagnosis present

## 2020-11-24 DIAGNOSIS — Z720 Tobacco use: Secondary | ICD-10-CM | POA: Diagnosis present

## 2020-11-24 DIAGNOSIS — Z7982 Long term (current) use of aspirin: Secondary | ICD-10-CM

## 2020-11-24 DIAGNOSIS — F17210 Nicotine dependence, cigarettes, uncomplicated: Secondary | ICD-10-CM | POA: Diagnosis present

## 2020-11-24 DIAGNOSIS — K029 Dental caries, unspecified: Secondary | ICD-10-CM | POA: Diagnosis present

## 2020-11-24 DIAGNOSIS — Z8249 Family history of ischemic heart disease and other diseases of the circulatory system: Secondary | ICD-10-CM

## 2020-11-24 DIAGNOSIS — I10 Essential (primary) hypertension: Secondary | ICD-10-CM

## 2020-11-24 DIAGNOSIS — Z20822 Contact with and (suspected) exposure to covid-19: Secondary | ICD-10-CM | POA: Diagnosis present

## 2020-11-24 DIAGNOSIS — I2510 Atherosclerotic heart disease of native coronary artery without angina pectoris: Secondary | ICD-10-CM | POA: Diagnosis present

## 2020-11-24 DIAGNOSIS — I252 Old myocardial infarction: Secondary | ICD-10-CM

## 2020-11-24 DIAGNOSIS — Z7902 Long term (current) use of antithrombotics/antiplatelets: Secondary | ICD-10-CM

## 2020-11-24 DIAGNOSIS — Z79899 Other long term (current) drug therapy: Secondary | ICD-10-CM

## 2020-11-24 DIAGNOSIS — J449 Chronic obstructive pulmonary disease, unspecified: Secondary | ICD-10-CM

## 2020-11-24 DIAGNOSIS — L03211 Cellulitis of face: Principal | ICD-10-CM | POA: Diagnosis present

## 2020-11-24 LAB — CBC WITH DIFFERENTIAL/PLATELET
Abs Immature Granulocytes: 0.01 10*3/uL (ref 0.00–0.07)
Basophils Absolute: 0 10*3/uL (ref 0.0–0.1)
Basophils Relative: 0 %
Eosinophils Absolute: 0.1 10*3/uL (ref 0.0–0.5)
Eosinophils Relative: 3 %
HCT: 47.2 % (ref 39.0–52.0)
Hemoglobin: 15.1 g/dL (ref 13.0–17.0)
Immature Granulocytes: 0 %
Lymphocytes Relative: 38 %
Lymphs Abs: 2.1 10*3/uL (ref 0.7–4.0)
MCH: 27.9 pg (ref 26.0–34.0)
MCHC: 32 g/dL (ref 30.0–36.0)
MCV: 87.2 fL (ref 80.0–100.0)
Monocytes Absolute: 0.7 10*3/uL (ref 0.1–1.0)
Monocytes Relative: 12 %
Neutro Abs: 2.7 10*3/uL (ref 1.7–7.7)
Neutrophils Relative %: 47 %
Platelets: 184 10*3/uL (ref 150–400)
RBC: 5.41 MIL/uL (ref 4.22–5.81)
RDW: 13.4 % (ref 11.5–15.5)
WBC: 5.7 10*3/uL (ref 4.0–10.5)
nRBC: 0 % (ref 0.0–0.2)

## 2020-11-24 LAB — LACTIC ACID, PLASMA
Lactic Acid, Venous: 1.4 mmol/L (ref 0.5–1.9)
Lactic Acid, Venous: 1 mmol/L (ref 0.5–1.9)

## 2020-11-24 LAB — BASIC METABOLIC PANEL
Anion gap: 8 (ref 5–15)
BUN: 17 mg/dL (ref 6–20)
CO2: 29 mmol/L (ref 22–32)
Calcium: 9.1 mg/dL (ref 8.9–10.3)
Chloride: 101 mmol/L (ref 98–111)
Creatinine, Ser: 1.45 mg/dL — ABNORMAL HIGH (ref 0.61–1.24)
GFR, Estimated: 57 mL/min — ABNORMAL LOW (ref >60)
Glucose, Bld: 98 mg/dL (ref 70–99)
Potassium: 4.6 mmol/L (ref 3.5–5.1)
Sodium: 138 mmol/L (ref 135–145)

## 2020-11-24 MED ORDER — IOHEXOL 300 MG/ML  SOLN
75.0000 mL | Freq: Once | INTRAMUSCULAR | Status: AC | PRN
  Administered 2020-11-24: 75 mL via INTRAVENOUS

## 2020-11-24 MED ORDER — SODIUM CHLORIDE 0.9 % IV SOLN
1.0000 g | Freq: Once | INTRAVENOUS | Status: AC
  Administered 2020-11-25: 1 g via INTRAVENOUS
  Filled 2020-11-24: qty 10

## 2020-11-24 NOTE — ED Notes (Signed)
Swollen lt face for one week  More wollen today  Was seen at [place] last pm and was given an antibiotic

## 2020-11-24 NOTE — ED Triage Notes (Signed)
Emergency Medicine Provider Triage Evaluation Note  Jeffrey Winters , a 55 y.o. male  was evaluated in triage.  Pt complains of left sided facial edema x1 week. Was seen at [facility] ED where patient declined I&D and opted for trial of antibiotics (Keflex and Bactrim). He has been compliant with antibiotics; however he notes edema got worse today and began to involve his left eye. No dental pain. Left side of cheek has been draining intermittently. No fever or chills. No difficulties breathing or trismus.  Review of Systems  Positive: Facial edema Negative: Fever/chills  Physical Exam  BP 135/81 (BP Location: Left Arm)   Pulse 86   Temp 98.4 F (36.9 C) (Oral)   Resp 18   Ht 5\' 8"  (1.727 m)   Wt 105.3 kg   SpO2 95%   BMI 35.30 kg/m  Gen:   Awake, no distress   HEENT:  Left sided cheek edema that involves left eye active drainage Resp:  Normal effort  Cardiac:  Normal rate  Abd:   Nondistended, nontender  MSK:   Moves extremities without difficulty  Neuro:  Speech clear   Medical Decision Making  Medically screening exam initiated at 7:39 PM.  Appropriate orders placed.  [name] was informed that the remainder of the evaluation will be completed by another provider, this initial triage assessment does not replace that evaluation, and the importance of remaining in the ED until their evaluation is complete.  Clinical Impression  Facial edema that involved left eye. CT maxillofacial to evaluate infection. Routine labs.    Suzy Bouchard, [place] 11/24/20 1946

## 2020-11-24 NOTE — ED Provider Notes (Signed)
[place] EMERGENCY DEPARTMENT Provider Note   CSN: [id number] Arrival date & time: 11/24/20  1850     History Chief Complaint  Patient presents with  . Facial Swelling    Jeffrey Winters is a 55 y.o. male.  Patient with history of coronary artery disease, peripheral arterial disease, hypertension presents to the emergency department today for left-sided facial swelling.  Patient states that this began about a week ago and has been gradually worsening.  He has applied topical boil medication without improvement.  Patient was seen at emergency department [facility] yesterday.  He was prescribed Keflex and Bactrim.  He thinks that he has taken about 4 doses.  No fevers.  He has had a small amount of drainage from the left face.  No vomiting.  Swelling has progressed to below the left eye.         Past Medical History:  Diagnosis Date  . Coronary artery disease 2017   MI s/p PCI to OM2 // s/p MI 07/2019 >> DES to RCA; residual RPL2 80 // Myoview 08/2019: EF 61, small area of mod ischemia inf base; Low Risk  . Hypertension   . PAD (peripheral artery disease) ([place])    LE Art [place] 08/2019: R SFA 50-74; L SFA 50-74    Patient Active Problem List   Diagnosis Date Noted  . Tobacco abuse 11/20/2020  . Influenza vaccine refused 05/29/2020  . PAD (peripheral artery disease) ([place])   . Hyperlipidemia 07/12/2019  . STEMI (ST elevation myocardial infarction) ([place]) 07/10/2019  . STEMI involving right coronary artery ([place]) 07/10/2019    Past Surgical History:  Procedure Laterality Date  . ABDOMINAL AORTOGRAM W/LOWER EXTREMITY Bilateral 10/05/2019   Procedure: ABDOMINAL AORTOGRAM W/LOWER EXTREMITY;  Surgeon: Lorretta Harp, MD;  Location: [place] CV LAB;  Service: Cardiovascular;  Laterality: Bilateral;  . ABDOMINAL AORTOGRAM W/LOWER EXTREMITY N/A 11/09/2019   Procedure: ABDOMINAL AORTOGRAM W/LOWER EXTREMITY;  Surgeon: Lorretta Harp, MD;  Location: [place] CV  LAB;  Service: Cardiovascular;  Laterality: N/A;  . CORONARY/GRAFT ACUTE MI REVASCULARIZATION N/A 07/10/2019   Procedure: CORONARY/GRAFT ACUTE MI REVASCULARIZATION;  Surgeon: Nelva Bush, MD;  Location: [place] CV LAB;  Service: Cardiovascular;  Laterality: N/A;  . INTRAVASCULAR LITHOTRIPSY  11/09/2019   Procedure: INTRAVASCULAR LITHOTRIPSY;  Surgeon: Lorretta Harp, MD;  Location: [place] CV LAB;  Service: Cardiovascular;;  . LEFT HEART CATH AND CORONARY ANGIOGRAPHY N/A 07/10/2019   Procedure: LEFT HEART CATH AND CORONARY ANGIOGRAPHY;  Surgeon: Nelva Bush, MD;  Location: [place] CV LAB;  Service: Cardiovascular;  Laterality: N/A;  . PERCUTANEOUS CORONARY STENT INTERVENTION (PCI-S)  2017   DES to OM2 at [facility]  . PERIPHERAL VASCULAR ATHERECTOMY Left 10/05/2019   Procedure: PERIPHERAL VASCULAR ATHERECTOMY;  Surgeon: Lorretta Harp, MD;  Location: [place] CV LAB;  Service: Cardiovascular;  Laterality: Left;  SFA  . PERIPHERAL VASCULAR BALLOON ANGIOPLASTY  11/09/2019   Procedure: PERIPHERAL VASCULAR BALLOON ANGIOPLASTY;  Surgeon: Lorretta Harp, MD;  Location: [place] CV LAB;  Service: Cardiovascular;;  . PERIPHERAL VASCULAR INTERVENTION Left 10/05/2019   Procedure: PERIPHERAL VASCULAR INTERVENTION;  Surgeon: Lorretta Harp, MD;  Location: [place] CV LAB;  Service: Cardiovascular;  Laterality: Left;  SFA       Family History  Problem Relation Age of Onset  . Heart disease Mother   . Heart disease Father     Social History   Tobacco Use  . Smoking status: Current Every  Day Smoker    Packs/day: 1.00    Years: 7.00    Pack years: 7.00    Types: Cigarettes  . Smokeless tobacco: Never Used  . Tobacco comment: smokes 1/2 pack per day 06/19/2020  Substance Use Topics  . Alcohol use: Not Currently  . Drug use: No    Home Medications Prior to Admission medications   Medication Sig Start Date End Date Taking? Authorizing Provider  albuterol (VENTOLIN  HFA) 108 (90 Base) MCG/ACT inhaler INHALE 1-2 PUFFS INTO THE LUNGS EVERY 6 (SIX) HOURS AS NEEDED FOR WHEEZING OR SHORTNESS OF BREATH. 05/14/20 05/14/21  [name] T, PA-C  aspirin 81 MG chewable tablet Chew 1 tablet (81 mg total) by mouth daily. 07/13/19   Cheryln Manly, NP  atorvastatin (LIPITOR) 80 MG tablet TAKE 1 TABLET (80 MG TOTAL) BY MOUTH DAILY AT 6 PM. Patient taking differently: Take 80 mg by mouth daily. Take 1 tablet by mouth daily at [facility] 09/04/20 09/04/21  Sherren Mocha, MD  clopidogrel (PLAVIX) 75 MG tablet Take 1 tablet (75 mg total) by mouth daily. 11/11/20   [name] T, PA-C  diphenhydramine-acetaminophen (TYLENOL PM) 25-500 MG TABS tablet Take 0.5 tablets by mouth at bedtime.    [provider]  famotidine (PEPCID) 20 MG tablet Take 1 tablet (20 mg total) by mouth 2 (two) times daily. 07/21/19   [name] T, PA-C  isosorbide mononitrate (IMDUR) 30 MG 24 hr tablet TAKE 1/2 TABLET BY MOUTH DAILY. 05/17/20 05/17/21  [name] T, PA-C  metoprolol succinate (TOPROL-XL) 25 MG 24 hr tablet TAKE 1 TABLET BY MOUTH DAILY. MAY TAKE 1 ADDITIONAL TABLET AS NEEDED FOR PALPITATIONS. 09/04/20 09/04/21  Sherren Mocha, MD  nitroGLYCERIN (NITROSTAT) 0.4 MG SL tablet PLACE 1 TABLET (0.4 MG TOTAL) UNDER THE TONGUE EVERY 5 (FIVE) MINUTES AS NEEDED FOR CHEST PAIN. 07/05/20 07/05/21  Davonna Belling, MD  prasugrel (EFFIENT) 10 MG TABS tablet TAKE 1 TABLET (10 MG TOTAL) BY MOUTH DAILY. 03/12/20 03/12/21  Nicolette Bang, DO  umeclidinium-vilanterol (ANORO ELLIPTA) 62.5-25 MCG/INH AEPB Inhale 1 puff into the lungs daily. 10/08/20   Hunsucker, Bonna Gains, MD    Allergies    Patient has no known allergies.  Review of Systems   Review of Systems  Constitutional: Negative for chills and fever.  HENT: Positive for facial swelling. Negative for dental problem, rhinorrhea, sore throat and trouble swallowing.   Eyes: Negative for redness.  Respiratory: Negative for cough.    Cardiovascular: Negative for chest pain.  Gastrointestinal: Negative for abdominal pain, diarrhea, nausea and vomiting.  Genitourinary: Negative for dysuria and hematuria.  Musculoskeletal: Negative for myalgias.  Skin: Negative for color change and rash.       Positive for abscess  Neurological: Negative for headaches.  Hematological: Negative for adenopathy.    Physical Exam Updated Vital Signs BP 135/81 (BP Location: Left Arm)   Pulse 86   Temp 98.4 F (36.9 C) (Oral)   Resp 18   Ht 5\' 8"  (1.727 m)   Wt 105.3 kg   SpO2 95%   BMI 35.30 kg/m   Physical Exam Vitals and nursing note reviewed.  Constitutional:      Appearance: He is well-developed.  HENT:     Head: Normocephalic and atraumatic.     Comments: Patient with significant induration of the left face extending from the angle of the mandible, up to cheek, and to below the left eye.  There is a small area of drainage in the center  of the indurated area.  There is a small nodule at the level of the anterior portion of the mandible on the left chin which is palpable.    Right Ear: Tympanic membrane, ear canal and external ear normal.     Left Ear: Tympanic membrane, ear canal and external ear normal.     Mouth/Throat:     Mouth: Mucous membranes are moist.  Eyes:     General:        Right eye: No discharge.        Left eye: No discharge.     Conjunctiva/sclera: Conjunctivae normal.  Cardiovascular:     Rate and Rhythm: Normal rate and regular rhythm.     Heart sounds: Normal heart sounds.  Pulmonary:     Effort: Pulmonary effort is normal.     Breath sounds: Normal breath sounds.  Abdominal:     Palpations: Abdomen is soft.     Tenderness: There is no abdominal tenderness.  Musculoskeletal:     Cervical back: Normal range of motion and neck supple.  Skin:    General: Skin is warm and dry.  Neurological:     Mental Status: He is alert.     ED Results / Procedures / Treatments   Labs (all labs ordered  are listed, but only abnormal results are displayed) Labs Reviewed  BASIC METABOLIC PANEL - Abnormal; Notable for the following components:      Result Value   Creatinine, Ser 1.45 (*)    GFR, Estimated 57 (*)    All other components within normal limits  RESP PANEL BY RT-PCR (FLU A&B, COVID) ARPGX2  CBC WITH DIFFERENTIAL/PLATELET  LACTIC ACID, PLASMA  LACTIC ACID, PLASMA    EKG None  Radiology CT Maxillofacial W Contrast  Result Date: 11/24/2020 CLINICAL DATA:  Facial swelling EXAM: CT MAXILLOFACIAL WITH CONTRAST TECHNIQUE: Multidetector CT imaging of the maxillofacial structures was performed with intravenous contrast. Multiplanar CT image reconstructions were also generated. CONTRAST:  43mL OMNIPAQUE IOHEXOL 300 MG/ML  SOLN COMPARISON:  05/05/2020 FINDINGS: Osseous: Periapical lucency at the root of tooth 18. No acute fracture. Orbits: Negative. No traumatic or inflammatory finding. Sinuses: Clear. Soft tissues: Left facial soft tissue swelling and inflammation. Small subcutaneous fluid collection measuring 7 mm (3:42). More inferiorly, there is a second subcutaneous collection measuring 15 mm (3:15). There is thickening of the left platysma. There are multiple reactive left submandibular lymph nodes. Limited intracranial: Normal IMPRESSION: 1. Left facial cellulitis with 2 small subcutaneous collections, measuring up to 15 mm. These are favored to be inclusion cyst but the possibility of infection remains. 2. Periapical lucency at the root of tooth 18. This is near the area of inflammation but does not appear to be the source. Electronically Signed   By: Ulyses Jarred M.D.   On: 11/24/2020 23:00    Procedures Procedures   Medications Ordered in ED Medications  cefTRIAXone (ROCEPHIN) 1 g in sodium chloride 0.9 % 100 mL IVPB (has no administration in time range)  iohexol (OMNIPAQUE) 300 MG/ML solution 75 mL (75 mLs Intravenous Contrast Given 11/24/20 2244)    ED Course  I have  reviewed the triage vital signs and the nursing notes.  Pertinent labs & imaging results that were available during my care of the patient were reviewed by me and considered in my medical decision making (see chart for details).  Patient seen and examined. CT pending.  Extensive facial cellulitis.  There is a small area of active drainage.  Vital  signs reviewed and are as follows: BP 135/81 (BP Location: Left Arm)   Pulse 86   Temp 98.4 F (36.9 C) (Oral)   Resp 18   Ht 5\' 8"  (1.727 m)   Wt 105.3 kg   SpO2 95%   BMI 35.30 kg/m   CT imaging reviewed.  Seen at bedside by Dr. Almyra Free and myself.  Plan for admission to the hospital for IV antibiotics.  Patient is agreeable.  Spoke with Dr. Tonie Winters of [facility] hospitalist who will see patient.    MDM Rules/Calculators/A&P                          Extensive facial cellulitis with small fluid collections not amenable to drainage at this point.  Not improved over the past 24 hours with oral antibiotics.   Final Clinical Impression(s) / ED Diagnoses Final diagnoses:  Facial cellulitis    Rx / DC Orders ED Discharge Orders    None       Suann Larry 11/24/20 2353    Luna Fuse, MD 11/24/20 2355

## 2020-11-24 NOTE — ED Triage Notes (Signed)
Left Facial swelling x 1 week. Went to hospital yesterday and prescribed abx. Woke up today with more swelling.    Able to speak full sentences, swallow own secretions. Denies any shortness of breath and fevers.

## 2020-11-25 ENCOUNTER — Encounter (HOSPITAL_COMMUNITY): Payer: Self-pay | Admitting: Family Medicine

## 2020-11-25 DIAGNOSIS — F17210 Nicotine dependence, cigarettes, uncomplicated: Secondary | ICD-10-CM | POA: Diagnosis present

## 2020-11-25 DIAGNOSIS — Z20822 Contact with and (suspected) exposure to covid-19: Secondary | ICD-10-CM | POA: Diagnosis present

## 2020-11-25 DIAGNOSIS — I10 Essential (primary) hypertension: Secondary | ICD-10-CM

## 2020-11-25 DIAGNOSIS — K029 Dental caries, unspecified: Secondary | ICD-10-CM | POA: Diagnosis present

## 2020-11-25 DIAGNOSIS — J449 Chronic obstructive pulmonary disease, unspecified: Secondary | ICD-10-CM | POA: Diagnosis present

## 2020-11-25 DIAGNOSIS — I739 Peripheral vascular disease, unspecified: Secondary | ICD-10-CM | POA: Diagnosis present

## 2020-11-25 DIAGNOSIS — L03211 Cellulitis of face: Principal | ICD-10-CM

## 2020-11-25 DIAGNOSIS — Z79899 Other long term (current) drug therapy: Secondary | ICD-10-CM | POA: Diagnosis not present

## 2020-11-25 DIAGNOSIS — I2510 Atherosclerotic heart disease of native coronary artery without angina pectoris: Secondary | ICD-10-CM | POA: Diagnosis present

## 2020-11-25 DIAGNOSIS — Z7902 Long term (current) use of antithrombotics/antiplatelets: Secondary | ICD-10-CM | POA: Diagnosis not present

## 2020-11-25 DIAGNOSIS — Z7982 Long term (current) use of aspirin: Secondary | ICD-10-CM | POA: Diagnosis not present

## 2020-11-25 DIAGNOSIS — Z8249 Family history of ischemic heart disease and other diseases of the circulatory system: Secondary | ICD-10-CM | POA: Diagnosis not present

## 2020-11-25 DIAGNOSIS — I252 Old myocardial infarction: Secondary | ICD-10-CM | POA: Diagnosis not present

## 2020-11-25 LAB — BASIC METABOLIC PANEL
Anion gap: 4 — ABNORMAL LOW (ref 5–15)
BUN: 16 mg/dL (ref 6–20)
CO2: 28 mmol/L (ref 22–32)
Calcium: 8.7 mg/dL — ABNORMAL LOW (ref 8.9–10.3)
Chloride: 105 mmol/L (ref 98–111)
Creatinine, Ser: 0.98 mg/dL (ref 0.61–1.24)
GFR, Estimated: >60 mL/min (ref >60)
Glucose, Bld: 95 mg/dL (ref 70–99)
Potassium: 4.2 mmol/L (ref 3.5–5.1)
Sodium: 137 mmol/L (ref 135–145)

## 2020-11-25 LAB — CBC
HCT: 44.6 % (ref 39.0–52.0)
Hemoglobin: 14.3 g/dL (ref 13.0–17.0)
MCH: 27.5 pg (ref 26.0–34.0)
MCHC: 32.1 g/dL (ref 30.0–36.0)
MCV: 85.8 fL (ref 80.0–100.0)
Platelets: 181 10*3/uL (ref 150–400)
RBC: 5.2 MIL/uL (ref 4.22–5.81)
RDW: 13.2 % (ref 11.5–15.5)
WBC: 4.2 10*3/uL (ref 4.0–10.5)
nRBC: 0 % (ref 0.0–0.2)

## 2020-11-25 LAB — RESP PANEL BY RT-PCR (FLU A&B, COVID) ARPGX2
Influenza A by PCR: NEGATIVE
Influenza B by PCR: NEGATIVE
SARS Coronavirus 2 by RT PCR: NEGATIVE

## 2020-11-25 LAB — HIV ANTIBODY (ROUTINE TESTING W REFLEX): HIV Screen 4th Generation wRfx: NONREACTIVE

## 2020-11-25 MED ORDER — ATORVASTATIN CALCIUM 80 MG PO TABS
80.0000 mg | ORAL_TABLET | Freq: Every day | ORAL | Status: DC
  Administered 2020-11-25 – 2020-11-26 (×2): 80 mg via ORAL
  Filled 2020-11-25 (×2): qty 1

## 2020-11-25 MED ORDER — ISOSORBIDE MONONITRATE ER 30 MG PO TB24
15.0000 mg | ORAL_TABLET | Freq: Every day | ORAL | Status: DC
  Administered 2020-11-25 – 2020-11-26 (×2): 15 mg via ORAL
  Filled 2020-11-25 (×2): qty 1

## 2020-11-25 MED ORDER — LACTATED RINGERS IV SOLN: INTRAVENOUS | Status: DC

## 2020-11-25 MED ORDER — SENNOSIDES-DOCUSATE SODIUM 8.6-50 MG PO TABS: 1.0000 | ORAL_TABLET | Freq: Every evening | ORAL | Status: DC | PRN

## 2020-11-25 MED ORDER — CLOPIDOGREL BISULFATE 75 MG PO TABS
75.0000 mg | ORAL_TABLET | Freq: Every day | ORAL | Status: DC
  Administered 2020-11-25 – 2020-11-26 (×2): 75 mg via ORAL
  Filled 2020-11-25 (×2): qty 1

## 2020-11-25 MED ORDER — NITROGLYCERIN 0.4 MG SL SUBL: 0.4000 mg | SUBLINGUAL_TABLET | SUBLINGUAL | Status: DC | PRN

## 2020-11-25 MED ORDER — ALBUTEROL SULFATE HFA 108 (90 BASE) MCG/ACT IN AERS: 1.0000 | INHALATION_SPRAY | Freq: Four times a day (QID) | RESPIRATORY_TRACT | Status: DC | PRN

## 2020-11-25 MED ORDER — ACETAMINOPHEN 650 MG RE SUPP: 650.0000 mg | Freq: Four times a day (QID) | RECTAL | Status: DC | PRN

## 2020-11-25 MED ORDER — UMECLIDINIUM-VILANTEROL 62.5-25 MCG/INH IN AEPB
1.0000 | INHALATION_SPRAY | Freq: Every day | RESPIRATORY_TRACT | Status: DC
  Administered 2020-11-25 – 2020-11-26 (×2): 1 via RESPIRATORY_TRACT
  Filled 2020-11-25: qty 14

## 2020-11-25 MED ORDER — ACETAMINOPHEN 325 MG PO TABS: 650.0000 mg | ORAL_TABLET | Freq: Four times a day (QID) | ORAL | Status: DC | PRN

## 2020-11-25 MED ORDER — NICOTINE 7 MG/24HR TD PT24
7.0000 mg | MEDICATED_PATCH | Freq: Every day | TRANSDERMAL | Status: DC
  Filled 2020-11-25 (×2): qty 1

## 2020-11-25 MED ORDER — METOPROLOL SUCCINATE ER 25 MG PO TB24
25.0000 mg | ORAL_TABLET | Freq: Every day | ORAL | Status: DC
  Administered 2020-11-25 – 2020-11-26 (×2): 25 mg via ORAL
  Filled 2020-11-25 (×2): qty 1

## 2020-11-25 MED ORDER — OXYCODONE HCL 5 MG PO TABS
5.0000 mg | ORAL_TABLET | Freq: Four times a day (QID) | ORAL | Status: DC | PRN
Start: 2020-11-25 — End: 2020-11-26

## 2020-11-25 MED ORDER — CLINDAMYCIN PHOSPHATE 600 MG/50ML IV SOLN
600.0000 mg | Freq: Three times a day (TID) | INTRAVENOUS | Status: DC
  Administered 2020-11-25 – 2020-11-26 (×4): 600 mg via INTRAVENOUS
  Filled 2020-11-25 (×4): qty 50

## 2020-11-25 NOTE — H&P (Signed)
History and Physical    SANJIT MCMICHAEL DTO:[id number] DOB: 04-Dec-1965 DOA: 11/24/2020  PCP: Lorretta Harp, MD   Patient coming from: Home  Chief Complaint: Swelling of left side of face  HPI: Jeffrey Winters is a 55 y.o. male with medical history significant for, hypertension, PAD, COPD, tobacco abuse who presents for evaluation of left-sided facial swelling.  Patient reports that a week ago he noticed a small bump in the left mid cheek region that has gradually gotten bigger and more painful.  He has had some mild yellow weeping and drainage from the left cheek.  He went to the emergency room in [place] yesterday and was prescribed Keflex and Bactrim of which she is taken 4 doses.  He reports that he has had no improvement and actually the swelling has increased in size since yesterday.  He denies any fever, chills, nausea, vomiting, diarrhea, chest pain, shortness of breath, cough, difficulty swallowing and no change in speech.  He has not had any injury or trauma to the side of his face.  ED Course: Patient is hemodynamically stable in the emergency room.  CBC is normal.  BMP is unremarkable.  CT scanning of the face shows left-sided cellulitis with 2 small 15 mm fluid collections.  Orofacial surgery is not available until tomorrow morning.  Hospitalist service has been asked to manage patient overnight  Review of Systems:  General: Denies fever, chills, weight loss, night sweats.  Denies dizziness.  Denies change in appetite HENT: Has swelling of left side of face with mild drainage.  Denies head trauma, denies change in hearing, tinnitus.  Denies nasal congestion or bleeding.  Denies sore throat, sores in mouth.  Denies difficulty swallowing Eyes: Denies blurry vision, pain in eye, drainage.  Denies discoloration of eyes. Neck: Denies pain.  Denies swelling.  Denies pain with movement. Cardiovascular: Denies chest pain, palpitations.  Denies edema.  Denies orthopnea Respiratory: Denies  shortness of breath, cough.  Denies wheezing.  Denies sputum production Gastrointestinal: Denies abdominal pain, swelling.  Denies nausea, vomiting, diarrhea.  Denies melena.  Denies hematemesis. Musculoskeletal: Denies limitation of movement.  Denies deformity or swelling.  Denies pain.  Denies arthralgias or myalgias. Genitourinary: Denies pelvic pain.  Denies urinary frequency or hesitancy.  Denies dysuria.  Skin: Denies rash.  Denies petechiae, purpura, ecchymosis. Neurological: Denies headache. Denies syncope. Denies seizure activity. Denies paresthesia.  Denies slurred speech, drooping face.  Denies visual change. Psychiatric: Denies depression, anxiety. Denies hallucinations.  Past Medical History:  Diagnosis Date  . Coronary artery disease 2017   MI s/p PCI to OM2 // s/p MI 07/2019 >> DES to RCA; residual RPL2 80 // Myoview 08/2019: EF 61, small area of mod ischemia inf base; Low Risk  . Hypertension   . PAD (peripheral artery disease) ([place])    LE Art [place] 08/2019: R SFA 50-74; L SFA 50-74    Past Surgical History:  Procedure Laterality Date  . ABDOMINAL AORTOGRAM W/LOWER EXTREMITY Bilateral 10/05/2019   Procedure: ABDOMINAL AORTOGRAM W/LOWER EXTREMITY;  Surgeon: Lorretta Harp, MD;  Location: [place] CV LAB;  Service: Cardiovascular;  Laterality: Bilateral;  . ABDOMINAL AORTOGRAM W/LOWER EXTREMITY N/A 11/09/2019   Procedure: ABDOMINAL AORTOGRAM W/LOWER EXTREMITY;  Surgeon: Lorretta Harp, MD;  Location: [place] CV LAB;  Service: Cardiovascular;  Laterality: N/A;  . CORONARY/GRAFT ACUTE MI REVASCULARIZATION N/A 07/10/2019   Procedure: CORONARY/GRAFT ACUTE MI REVASCULARIZATION;  Surgeon: Nelva Bush, MD;  Location: [place] CV LAB;  Service: Cardiovascular;  Laterality: N/A;  . INTRAVASCULAR LITHOTRIPSY  11/09/2019   Procedure: INTRAVASCULAR LITHOTRIPSY;  Surgeon: Lorretta Harp, MD;  Location: [place] CV LAB;  Service: Cardiovascular;;  . LEFT HEART CATH AND  CORONARY ANGIOGRAPHY N/A 07/10/2019   Procedure: LEFT HEART CATH AND CORONARY ANGIOGRAPHY;  Surgeon: Nelva Bush, MD;  Location: [place] CV LAB;  Service: Cardiovascular;  Laterality: N/A;  . PERCUTANEOUS CORONARY STENT INTERVENTION (PCI-S)  2017   DES to OM2 at [facility]  . PERIPHERAL VASCULAR ATHERECTOMY Left 10/05/2019   Procedure: PERIPHERAL VASCULAR ATHERECTOMY;  Surgeon: Lorretta Harp, MD;  Location: [place] CV LAB;  Service: Cardiovascular;  Laterality: Left;  SFA  . PERIPHERAL VASCULAR BALLOON ANGIOPLASTY  11/09/2019   Procedure: PERIPHERAL VASCULAR BALLOON ANGIOPLASTY;  Surgeon: Lorretta Harp, MD;  Location: [place] CV LAB;  Service: Cardiovascular;;  . PERIPHERAL VASCULAR INTERVENTION Left 10/05/2019   Procedure: PERIPHERAL VASCULAR INTERVENTION;  Surgeon: Lorretta Harp, MD;  Location: [place] [facility] LAB;  Service: Cardiovascular;  Laterality: Left;  SFA    Social History  reports that he has been smoking cigarettes. He has a 7.00 pack-year smoking history. He has never used smokeless tobacco. He reports previous alcohol use. He reports that he does not use drugs.  No Known Allergies  Family History  Problem Relation Age of Onset  . Heart disease Mother   . Heart disease Father      Prior to Admission medications   Medication Sig Start Date End Date Taking? Authorizing Provider  albuterol (VENTOLIN HFA) 108 (90 Base) MCG/ACT inhaler INHALE 1-2 PUFFS INTO THE LUNGS EVERY 6 (SIX) HOURS AS NEEDED FOR WHEEZING OR SHORTNESS OF BREATH. 05/14/20 05/14/21  [name] T, PA-C  aspirin 81 MG chewable tablet Chew 1 tablet (81 mg total) by mouth daily. 07/13/19   Cheryln Manly, NP  atorvastatin (LIPITOR) 80 MG tablet TAKE 1 TABLET (80 MG TOTAL) BY MOUTH DAILY AT 6 PM. Patient taking differently: Take 80 mg by mouth daily. Take 1 tablet by mouth daily at [facility] 09/04/20 09/04/21  Sherren Mocha, MD  clopidogrel (PLAVIX) 75 MG tablet Take 1 tablet (75 mg total) by mouth  daily. 11/11/20   [name] T, PA-C  diphenhydramine-acetaminophen (TYLENOL PM) 25-500 MG TABS tablet Take 0.5 tablets by mouth at bedtime.    [provider]  famotidine (PEPCID) 20 MG tablet Take 1 tablet (20 mg total) by mouth 2 (two) times daily. 07/21/19   [name] T, PA-C  isosorbide mononitrate (IMDUR) 30 MG 24 hr tablet TAKE 1/2 TABLET BY MOUTH DAILY. 05/17/20 05/17/21  [name] T, PA-C  metoprolol succinate (TOPROL-XL) 25 MG 24 hr tablet TAKE 1 TABLET BY MOUTH DAILY. MAY TAKE 1 ADDITIONAL TABLET AS NEEDED FOR PALPITATIONS. 09/04/20 09/04/21  Sherren Mocha, MD  nitroGLYCERIN (NITROSTAT) 0.4 MG SL tablet PLACE 1 TABLET (0.4 MG TOTAL) UNDER THE TONGUE EVERY 5 (FIVE) MINUTES AS NEEDED FOR CHEST PAIN. 07/05/20 07/05/21  Davonna Belling, MD  prasugrel (EFFIENT) 10 MG TABS tablet TAKE 1 TABLET (10 MG TOTAL) BY MOUTH DAILY. 03/12/20 03/12/21  Nicolette Bang, DO  umeclidinium-vilanterol (ANORO ELLIPTA) 62.5-25 MCG/INH AEPB Inhale 1 puff into the lungs daily. 10/08/20   Hunsucker, Bonna Gains, MD    Physical Exam: Vitals:   11/24/20 2300 11/24/20 2330 11/25/20 0000 11/25/20 0007  BP: 126/70 126/74 (!) 142/94   Pulse: 65 73 77   Resp:    16  Temp:      TempSrc:  SpO2: 98% 99% 100%   Weight:      Height:        Constitutional: NAD, calm, comfortable Vitals:   11/24/20 2300 11/24/20 2330 11/25/20 0000 11/25/20 0007  BP: 126/70 126/74 (!) 142/94   Pulse: 65 73 77   Resp:    16  Temp:      TempSrc:      SpO2: 98% 99% 100%   Weight:      Height:       General: WDWN, Alert and oriented x3.  Eyes: EOMI, PERRL,  conjunctivae normal.  Sclera nonicteric HENT:  [place]/AT, external ears normal.  Nares patent without epistasis.  Mucous membranes are moist. Posterior pharynx clear of any exudate or lesions.  Induration and swelling of left side of the face from the angle of the mandible to just below the left eye.  Mild swelling of the left lower eyelid.  Small area of  excoriation with mild drainage.  No fluctuation noted Neck: Soft, normal range of motion, supple, no masses, Trachea midline Respiratory: clear to auscultation bilaterally, no wheezing, no crackles. Normal respiratory effort. No accessory muscle use.  Cardiovascular: Regular rate and rhythm, no murmurs / rubs / gallops. No extremity edema.  Abdomen: Soft, no tenderness, nondistended, no rebound or guarding.  No masses palpated. Bowel sounds normoactive Musculoskeletal: FROM. no cyanosis. No joint deformity upper and lower extremities. Normal muscle tone.  Skin: Warm, dry, intact no rashes, lesions, ulcers. No induration Neurologic: CN 2-12 grossly intact. Normal speech.  Sensation intact. Strength 5/5 in all extremities.  With normal extension without deviation Psychiatric: Normal judgment and insight.  Normal mood.    Labs on Admission: I have personally reviewed following labs and imaging studies  CBC: Recent Labs  Lab 11/24/20 1954  WBC 5.7  NEUTROABS 2.7  HGB 15.1  HCT 47.2  MCV 87.2  PLT 322    Basic Metabolic Panel: Recent Labs  Lab 11/24/20 1954  NA 138  K 4.6  CL 101  CO2 29  GLUCOSE 98  BUN 17  CREATININE 1.45*  CALCIUM 9.1    GFR: Estimated Creatinine Clearance: 67.7 mL/min (A) (by C-G formula based on SCr of 1.45 mg/dL (H)).  Liver Function Tests: No results for input(s): AST, ALT, ALKPHOS, BILITOT, PROT, ALBUMIN in the last 168 hours.  Urine analysis: No results found for: COLORURINE, APPEARANCEUR, LABSPEC, PHURINE, GLUCOSEU, HGBUR, BILIRUBINUR, KETONESUR, PROTEINUR, UROBILINOGEN, NITRITE, LEUKOCYTESUR  Radiological Exams on Admission: CT Maxillofacial W Contrast  Result Date: 11/24/2020 CLINICAL DATA:  Facial swelling EXAM: CT MAXILLOFACIAL WITH CONTRAST TECHNIQUE: Multidetector CT imaging of the maxillofacial structures was performed with intravenous contrast. Multiplanar CT image reconstructions were also generated. CONTRAST:  21mL OMNIPAQUE IOHEXOL  300 MG/ML  SOLN COMPARISON:  05/05/2020 FINDINGS: Osseous: Periapical lucency at the root of tooth 18. No acute fracture. Orbits: Negative. No traumatic or inflammatory finding. Sinuses: Clear. Soft tissues: Left facial soft tissue swelling and inflammation. Small subcutaneous fluid collection measuring 7 mm (3:42). More inferiorly, there is a second subcutaneous collection measuring 15 mm (3:15). There is thickening of the left platysma. There are multiple reactive left submandibular lymph nodes. Limited intracranial: Normal IMPRESSION: 1. Left facial cellulitis with 2 small subcutaneous collections, measuring up to 15 mm. These are favored to be inclusion cyst but the possibility of infection remains. 2. Periapical lucency at the root of tooth 18. This is near the area of inflammation but does not appear to be the source. Electronically Signed   By: Lennette Bihari  [place] M.D.   On: 11/24/2020 23:00    Assessment/Plan Principal Problem:   Facial cellulitis History B[place] is admitted to [place] floor.  He is started on clindamycin for empiric antibiotic coverage.  Will need facial surgery consult when they are available tomorrow morning. Oxycodone provided for pain control as needed.  Active Problems:   Essential hypertension Continue metoprolol.  Monitor blood pressure    COPD (chronic obstructive pulmonary disease) Continue Anora Ellipta daily.  Albuterol as needed for shortness of breath, cough, wheeze     Tobacco abuse 18 passive right to prevent withdrawal.  Patient will need smoking cessation education before discharge    DVT prophylaxis: TED hose and early ambulation with low [place] score Code Status:   Full Code  Family Communication:     Diagnosis and plan discussed with patient.  Patient verbalized understanding agrees with plan.  Further recommendation follow as clinical indicated Disposition Plan:   Patient is from:  Home  Anticipated DC to:  Home  Anticipated DC date:  Anticipate less  than 2 midnight stay in the hospital  Anticipated DC barriers: No barriers to discharge identified at this time   Admission status:  Observation   Eben Burow MD [facility]  How to contact the [facility] Attending or Consulting provider [place] or covering provider during after hours [place], for this patient?   1. Check the care team in [facility] and look for a) attending/consulting [facility] provider listed and b) the [facility] team listed 2. Log into [email or web address] and use [place]'s universal password to access. If you do not have the password, please contact the hospital operator. 3. Locate the [facility] provider you are looking for under [facility] and page to a number that you can be directly reached. 4. If you still have difficulty reaching the provider, please page the [employer] (Director on Call) for the Hospitalists listed on amion for assistance.  11/25/2020, 12:28 AM

## 2020-11-25 NOTE — Progress Notes (Signed)
Brief same-day note:  Patient is a 55 year old male with history of hypertension, peripheral artery disease, COPD, tobacco abuse, coronary disease status post NSTEMI treated with distal RCA intervention who presented with complaints of swelling of the left side of the face and pain.  Problem started a week ago.  Patient was taking oral antibiotics as an outpatient after he was seen at emergency room in  [place] but did not help.  No report of fever or chills at home.  On presentation he was hemodynamically stable.  CT scan of the face showed left-sided cellulitis with 2 small 15 mm fluid collections, oral surgery consulted today.  Started on IV antibiotics. Patient seen and examined at the bedside this morning.  Currently he is hemodynamically stable.  He was complaining of the pain on the cellulitic area.  Examination revealed swelling of the left side of the face, draining wound on the left cheek .oral examination revealed possible dental caries on the upper molar teeth .  Denies any dental problems and dental pain other examination was unremarkable.

## 2020-11-25 NOTE — ED Notes (Signed)
Report called to [facility] rn on 5n

## 2020-11-26 ENCOUNTER — Other Ambulatory Visit: Payer: Self-pay

## 2020-11-26 LAB — BASIC METABOLIC PANEL
Anion gap: 4 — ABNORMAL LOW (ref 5–15)
BUN: 15 mg/dL (ref 6–20)
CO2: 30 mmol/L (ref 22–32)
Calcium: 8.8 mg/dL — ABNORMAL LOW (ref 8.9–10.3)
Chloride: 103 mmol/L (ref 98–111)
Creatinine, Ser: 1.02 mg/dL (ref 0.61–1.24)
GFR, Estimated: >60 mL/min (ref >60)
Glucose, Bld: 104 mg/dL — ABNORMAL HIGH (ref 70–99)
Potassium: 4.6 mmol/L (ref 3.5–5.1)
Sodium: 137 mmol/L (ref 135–145)

## 2020-11-26 LAB — CBC WITH DIFFERENTIAL/PLATELET
Abs Immature Granulocytes: 0 10*3/uL (ref 0.00–0.07)
Basophils Absolute: 0 10*3/uL (ref 0.0–0.1)
Basophils Relative: 0 %
Eosinophils Absolute: 0.2 10*3/uL (ref 0.0–0.5)
Eosinophils Relative: 4 %
HCT: 42.5 % (ref 39.0–52.0)
Hemoglobin: 13.7 g/dL (ref 13.0–17.0)
Immature Granulocytes: 0 %
Lymphocytes Relative: 45 %
Lymphs Abs: 2.2 10*3/uL (ref 0.7–4.0)
MCH: 27.7 pg (ref 26.0–34.0)
MCHC: 32.2 g/dL (ref 30.0–36.0)
MCV: 86 fL (ref 80.0–100.0)
Monocytes Absolute: 0.7 10*3/uL (ref 0.1–1.0)
Monocytes Relative: 14 %
Neutro Abs: 1.8 10*3/uL (ref 1.7–7.7)
Neutrophils Relative %: 37 %
Platelets: 175 10*3/uL (ref 150–400)
RBC: 4.94 MIL/uL (ref 4.22–5.81)
RDW: 13.5 % (ref 11.5–15.5)
WBC: 4.8 10*3/uL (ref 4.0–10.5)
nRBC: 0 % (ref 0.0–0.2)

## 2020-11-26 MED ORDER — CLINDAMYCIN HCL 300 MG PO CAPS
300.0000 mg | ORAL_CAPSULE | Freq: Three times a day (TID) | ORAL | 0 refills | Status: AC
  Filled 2020-11-26: qty 21, 7d supply, fill #0

## 2020-11-26 NOTE — Discharge Summary (Signed)
Physician Discharge Summary  Jeffrey Winters [id number] DOB: 12-17-1965 DOA: 11/24/2020  PCP: Lorretta Harp, MD  Admit date: 11/24/2020 Discharge date: 11/26/2020  Admitted From: Home Disposition:  Home  Discharge Condition:Stable CODE STATUS:FULL Diet recommendation: Heart Healthy  Brief/Interim Summary:  Patient is a 55 year old male with history of hypertension, peripheral artery disease, COPD, tobacco abuse, coronary disease status post NSTEMI treated with distal RCA intervention who presented with complaints of swelling of the left side of the face and pain.  Problem started a week ago.  Patient was taking oral antibiotics as an outpatient after he was seen at emergency room in  [place] but did not help.  No report of fever or chills at home.  On presentation he was hemodynamically stable.  CT scan of the face showed left-sided cellulitis with 2 small 15 mm fluid collections, oral surgery consulted.  Started on IV antibiotics. Examination revealed swelling of the left side of the face, draining wound on the left cheek .oral examination revealed possible dental caries on the upper molar teeth .  Denies any dental problems and dental pain other examination was unremarkable. Hospital course remained stable.  After starting antibiotics, he feels great.  He is pain, swelling of the left face has significantly improved today.  I discussed the case with Dr. Glenford Peers, oral surgery, who reviewed the CT scans and recommended to discharge him on oral antibiotics and follow-up with him as an outpatient within a week. Patient is hemodynamically stable for discharge today.  Discharge Diagnoses:  Principal Problem:   Facial cellulitis Active Problems:   Tobacco abuse   Essential hypertension   COPD (chronic obstructive pulmonary disease) (HCC)    Discharge Instructions  Discharge Instructions    Diet - low sodium heart healthy   Complete by: As directed    Discharge instructions    Complete by: As directed    1)Please follow up with dental surgery as an outpatient as soon as possible.  Call for appointment today.  Name and number the provider has been attached. 2)Take prescribed medications as instructed.   Increase activity slowly   Complete by: As directed      Allergies as of 11/26/2020   No Known Allergies     Medication List    STOP taking these medications   cephALEXin 500 MG capsule Commonly known as: KEFLEX   sulfamethoxazole-trimethoprim 400-80 MG tablet Commonly known as: BACTRIM     TAKE these medications   albuterol 108 (90 Base) MCG/ACT inhaler Commonly known as: VENTOLIN HFA INHALE 1-2 PUFFS INTO THE LUNGS EVERY 6 (SIX) HOURS AS NEEDED FOR WHEEZING OR SHORTNESS OF BREATH.   Anoro Ellipta 62.5-25 MCG/INH Aepb Generic drug: umeclidinium-vilanterol Inhale 1 puff into the lungs daily.   aspirin 81 MG chewable tablet Chew 1 tablet (81 mg total) by mouth daily.   atorvastatin 80 MG tablet Commonly known as: LIPITOR TAKE 1 TABLET (80 MG TOTAL) BY MOUTH DAILY AT 6 PM. What changed:   how much to take  additional instructions   clindamycin 300 MG capsule Commonly known as: CLEOCIN Take 1 capsule (300 mg total) by mouth 3 (three) times daily for 7 days.   clopidogrel 75 MG tablet Commonly known as: PLAVIX Take 1 tablet (75 mg total) by mouth daily.   diphenhydramine-acetaminophen 25-500 MG Tabs tablet Commonly known as: TYLENOL PM Take 0.5 tablets by mouth at bedtime.   famotidine 20 MG tablet Commonly known as: Pepcid Take 1 tablet (20 mg total) by mouth  2 (two) times daily.   isosorbide mononitrate 30 MG 24 hr tablet Commonly known as: IMDUR TAKE 1/2 TABLET BY MOUTH DAILY. What changed: how much to take   metoprolol succinate 25 MG 24 hr tablet Commonly known as: TOPROL-XL TAKE 1 TABLET BY MOUTH DAILY. MAY TAKE 1 ADDITIONAL TABLET AS NEEDED FOR PALPITATIONS. What changed:   how much to take  how to take this  when to  take this  additional instructions   nitroGLYCERIN 0.4 MG SL tablet Commonly known as: NITROSTAT PLACE 1 TABLET (0.4 MG TOTAL) UNDER THE TONGUE EVERY 5 (FIVE) MINUTES AS NEEDED FOR CHEST PAIN. What changed: how much to take   prasugrel 10 MG Tabs tablet Commonly known as: EFFIENT TAKE 1 TABLET (10 MG TOTAL) BY MOUTH DAILY. What changed: how much to take       Follow-up Information    Lorretta Harp, MD. Schedule an appointment as soon as possible for a visit in 1 week(s).   Specialties: Cardiology, Radiology Contact information: [address] [place] [address] [phone]              No Known Allergies  Consultations:  Oral surgeon   Procedures/Studies: CT Maxillofacial W Contrast  Result Date: 11/24/2020 CLINICAL DATA:  Facial swelling EXAM: CT MAXILLOFACIAL WITH CONTRAST TECHNIQUE: Multidetector CT imaging of the maxillofacial structures was performed with intravenous contrast. Multiplanar CT image reconstructions were also generated. CONTRAST:  64mL OMNIPAQUE IOHEXOL 300 MG/ML  SOLN COMPARISON:  05/05/2020 FINDINGS: Osseous: Periapical lucency at the root of tooth 18. No acute fracture. Orbits: Negative. No traumatic or inflammatory finding. Sinuses: Clear. Soft tissues: Left facial soft tissue swelling and inflammation. Small subcutaneous fluid collection measuring 7 mm (3:42). More inferiorly, there is a second subcutaneous collection measuring 15 mm (3:15). There is thickening of the left platysma. There are multiple reactive left submandibular lymph nodes. Limited intracranial: Normal IMPRESSION: 1. Left facial cellulitis with 2 small subcutaneous collections, measuring up to 15 mm. These are favored to be inclusion cyst but the possibility of infection remains. 2. Periapical lucency at the root of tooth 18. This is near the area of inflammation but does not appear to be the source. Electronically Signed   By: Ulyses Jarred M.D.   On: 11/24/2020  23:00       Subjective: Patient seen and examined at the bedside this morning.  Hemodynamically stable for discharge  Discharge Exam: Vitals:   11/26/20 0811 11/26/20 0837  BP:  114/69  Pulse: 64 63  Resp: 18 18  Temp:  97.9 F (36.6 C)  SpO2: 96% 98%   Vitals:   11/25/20 2015 11/26/20 0244 11/26/20 0811 11/26/20 0837  BP: 106/61 115/69  114/69  Pulse: 72 61 64 63  Resp: 16 18 18 18   Temp: 98.5 F (36.9 C) 97.9 F (36.6 C)  97.9 F (36.6 C)  TempSrc: Oral Oral  Oral  SpO2: 98% 98% 96% 98%  Weight:      Height:        General: Pt is alert, awake, not in acute distress Cardiovascular: RRR, S1/S2 +, no rubs, no gallops Respiratory: CTA bilaterally, no wheezing, no rhonchi Abdominal: Soft, NT, ND, bowel sounds + Extremities: no edema, no cyanosis    The results of significant diagnostics from this hospitalization (including imaging, microbiology, ancillary and laboratory) are listed below for reference.     Microbiology: Recent Results (from the past 240 hour(s))  Resp Panel by RT-PCR (Flu A&B, Covid) Nasopharyngeal  Swab     Status: None   Collection Time: 11/24/20 11:58 PM   Specimen: Nasopharyngeal Swab; Nasopharyngeal(NP) swabs in vial transport medium  Result Value Ref Range Status   SARS Coronavirus 2 by RT PCR NEGATIVE NEGATIVE Final    Comment: (NOTE) SARS-CoV-2 target nucleic acids are NOT DETECTED.  The SARS-CoV-2 RNA is generally detectable in upper respiratory specimens during the acute phase of infection. The lowest concentration of SARS-CoV-2 viral copies this assay can detect is 138 copies/mL. A negative result does not preclude SARS-Cov-2 infection and should not be used as the sole basis for treatment or other patient management decisions. A negative result may occur with  improper specimen collection/handling, submission of specimen other than nasopharyngeal swab, presence of viral mutation(s) within the areas targeted by this assay, and  inadequate number of viral copies(<138 copies/mL). A negative result must be combined with clinical observations, patient history, and epidemiological information. The expected result is Negative.  Fact Sheet for Patients:  EntrepreneurPulse.com.au  Fact Sheet for Healthcare Providers:  IncredibleEmployment.be  This test is no t yet approved or cleared by the [place] FDA and  has been authorized for detection and/or diagnosis of SARS-CoV-2 by FDA under an Emergency Use Authorization (EUA). This EUA will remain  in effect (meaning this test can be used) for the duration of the COVID-19 declaration under Section 564(b)(1) of the Act, 21 U.S.C.section 360bbb-3(b)(1), unless the authorization is terminated  or revoked sooner.       Influenza A by PCR NEGATIVE NEGATIVE Final   Influenza B by PCR NEGATIVE NEGATIVE Final    Comment: (NOTE) The Xpert Xpress SARS-CoV-2/FLU/RSV plus assay is intended as an aid in the diagnosis of influenza from Nasopharyngeal swab specimens and should not be used as a sole basis for treatment. Nasal washings and aspirates are unacceptable for Xpert Xpress SARS-CoV-2/FLU/RSV testing.  Fact Sheet for Patients: EntrepreneurPulse.com.au  Fact Sheet for Healthcare Providers: IncredibleEmployment.be  This test is not yet approved or cleared by the [place] FDA and has been authorized for detection and/or diagnosis of SARS-CoV-2 by FDA under an Emergency Use Authorization (EUA). This EUA will remain in effect (meaning this test can be used) for the duration of the COVID-19 declaration under Section 564(b)(1) of the Act, 21 U.S.C. section 360bbb-3(b)(1), unless the authorization is terminated or revoked.  Performed at [facility], [place] [address] [place], [place] [address]      Labs: BNP (last 3 results) No results for input(s): BNP in the last 8760 hours. Basic  Metabolic Panel: Recent Labs  Lab 11/24/20 1954 11/25/20 0924 11/26/20 0401  NA 138 137 137  K 4.6 4.2 4.6  CL 101 105 103  CO2 29 28 30   GLUCOSE 98 95 104*  BUN 17 16 15   CREATININE 1.45* 0.98 1.02  CALCIUM 9.1 8.7* 8.8*   Liver Function Tests: No results for input(s): AST, ALT, ALKPHOS, BILITOT, PROT, ALBUMIN in the last 168 hours. No results for input(s): LIPASE, AMYLASE in the last 168 hours. No results for input(s): AMMONIA in the last 168 hours. CBC: Recent Labs  Lab 11/24/20 1954 11/25/20 0924 11/26/20 0401  WBC 5.7 4.2 4.8  NEUTROABS 2.7  --  1.8  HGB 15.1 14.3 13.7  HCT 47.2 44.6 42.5  MCV 87.2 85.8 86.0  PLT 184 181 175   Cardiac Enzymes: No results for input(s): CKTOTAL, CKMB, CKMBINDEX, TROPONINI in the last 168 hours. BNP: Invalid input(s): POCBNP CBG: No results for input(s): GLUCAP  in the last 168 hours. D-Dimer No results for input(s): DDIMER in the last 72 hours. Hgb A1c No results for input(s): HGBA1C in the last 72 hours. Lipid Profile No results for input(s): CHOL, HDL, LDLCALC, TRIG, CHOLHDL, LDLDIRECT in the last 72 hours. Thyroid function studies No results for input(s): TSH, T4TOTAL, T3FREE, THYROIDAB in the last 72 hours.  Invalid input(s): FREET3 Anemia work up No results for input(s): VITAMINB12, FOLATE, FERRITIN, TIBC, IRON, RETICCTPCT in the last 72 hours. Urinalysis No results found for: COLORURINE, APPEARANCEUR, [place], [place], GLUCOSEU, [employer], [place], [place], PROTEINUR, UROBILINOGEN, NITRITE, LEUKOCYTESUR Sepsis Labs Invalid input(s): PROCALCITONIN,  WBC,  LACTICIDVEN Microbiology Recent Results (from the past 240 hour(s))  Resp Panel by RT-PCR (Flu A&B, Covid) Nasopharyngeal Swab     Status: None   Collection Time: 11/24/20 11:58 PM   Specimen: Nasopharyngeal Swab; Nasopharyngeal(NP) swabs in vial transport medium  Result Value Ref Range Status   SARS Coronavirus 2 by RT PCR NEGATIVE NEGATIVE Final    Comment:  (NOTE) SARS-CoV-2 target nucleic acids are NOT DETECTED.  The SARS-CoV-2 RNA is generally detectable in upper respiratory specimens during the acute phase of infection. The lowest concentration of SARS-CoV-2 viral copies this assay can detect is 138 copies/mL. A negative result does not preclude SARS-Cov-2 infection and should not be used as the sole basis for treatment or other patient management decisions. A negative result may occur with  improper specimen collection/handling, submission of specimen other than nasopharyngeal swab, presence of viral mutation(s) within the areas targeted by this assay, and inadequate number of viral copies(<138 copies/mL). A negative result must be combined with clinical observations, patient history, and epidemiological information. The expected result is Negative.  Fact Sheet for Patients:  EntrepreneurPulse.com.au  Fact Sheet for Healthcare Providers:  IncredibleEmployment.be  This test is no t yet approved or cleared by the [place] FDA and  has been authorized for detection and/or diagnosis of SARS-CoV-2 by FDA under an Emergency Use Authorization (EUA). This EUA will remain  in effect (meaning this test can be used) for the duration of the COVID-19 declaration under Section 564(b)(1) of the Act, 21 U.S.C.section 360bbb-3(b)(1), unless the authorization is terminated  or revoked sooner.       Influenza A by PCR NEGATIVE NEGATIVE Final   Influenza B by PCR NEGATIVE NEGATIVE Final    Comment: (NOTE) The Xpert Xpress SARS-CoV-2/FLU/RSV plus assay is intended as an aid in the diagnosis of influenza from Nasopharyngeal swab specimens and should not be used as a sole basis for treatment. Nasal washings and aspirates are unacceptable for Xpert Xpress SARS-CoV-2/FLU/RSV testing.  Fact Sheet for Patients: EntrepreneurPulse.com.au  Fact Sheet for Healthcare  Providers: IncredibleEmployment.be  This test is not yet approved or cleared by the [place] FDA and has been authorized for detection and/or diagnosis of SARS-CoV-2 by FDA under an Emergency Use Authorization (EUA). This EUA will remain in effect (meaning this test can be used) for the duration of the COVID-19 declaration under Section 564(b)(1) of the Act, 21 U.S.C. section 360bbb-3(b)(1), unless the authorization is terminated or revoked.  Performed at [facility], [place] [address], [place],  [address]     Please note: You were cared for by a hospitalist during your hospital stay. Once you are discharged, your primary care physician will handle any further medical issues. Please note that NO REFILLS for any discharge medications will be authorized once you are discharged, as it is imperative that you return to your primary care  physician (or establish a relationship with a primary care physician if you do not have one) for your post hospital discharge needs so that they can reassess your need for medications and monitor your lab values.    Time coordinating discharge: 40 minutes  SIGNED:   Shelly Coss, MD  [facility] 11/26/2020, 11:38 AM Pager [phone]  If 7PM-7AM, please contact night-coverage [email or web address] Password [email or web address]

## 2020-11-26 NOTE — Plan of Care (Signed)
  Problem: Clinical Measurements: Goal: Ability to avoid or minimize complications of infection will improve Outcome: Adequate for Discharge   Problem: Skin Integrity: Goal: Skin integrity will improve Outcome: Adequate for Discharge   

## 2020-11-26 NOTE — Plan of Care (Signed)
?  Problem: Clinical Measurements: ?Goal: Ability to avoid or minimize complications of infection will improve ?Outcome: Progressing ?  ?Problem: Skin Integrity: ?Goal: Skin integrity will improve ?Outcome: Progressing ?  ?

## 2020-11-26 NOTE — Progress Notes (Signed)
   11/26/20 1644  AVS Discharge Documentation  AVS Discharge Instructions Including Medications Provided to patient/caregiver  Name of Person Receiving AVS Discharge Instructions Including Medications Jeffrey Winters  Name of Clinician That Reviewed AVS Discharge Instructions Including Medications Gregary Signs, RN    Patient educated about discharge and follow up instructions and verbalized an understanding of teaching. Patient IV removed prior to discharge. Patient discharged home with all personal belongings, wound care supplies and d/c paperwork. Patient left unit via ambulation with no issues.

## 2020-12-03 ENCOUNTER — Other Ambulatory Visit: Payer: Self-pay

## 2020-12-09 ENCOUNTER — Other Ambulatory Visit: Payer: Self-pay

## 2020-12-09 MED FILL — Isosorbide Mononitrate Tab ER 24HR 30 MG: ORAL | 30 days supply | Qty: 15 | Fill #1 | Status: AC

## 2020-12-09 MED FILL — Atorvastatin Calcium Tab 80 MG (Base Equivalent): ORAL | 30 days supply | Qty: 30 | Fill #0 | Status: AC

## 2020-12-09 MED FILL — Prasugrel HCl Tab 10 MG (Base Equiv): ORAL | 30 days supply | Qty: 30 | Fill #1 | Status: AC

## 2020-12-10 ENCOUNTER — Other Ambulatory Visit: Payer: Self-pay

## 2021-01-09 ENCOUNTER — Other Ambulatory Visit: Payer: Self-pay

## 2021-01-09 MED FILL — Atorvastatin Calcium Tab 80 MG (Base Equivalent): ORAL | 30 days supply | Qty: 30 | Fill #1 | Status: AC

## 2021-01-09 MED FILL — Metoprolol Succinate Tab ER 24HR 25 MG (Tartrate Equiv): ORAL | 30 days supply | Qty: 60 | Fill #1 | Status: AC

## 2021-01-09 MED FILL — Prasugrel HCl Tab 10 MG (Base Equiv): ORAL | 30 days supply | Qty: 30 | Fill #2 | Status: AC

## 2021-01-09 MED FILL — Isosorbide Mononitrate Tab ER 24HR 30 MG: ORAL | 30 days supply | Qty: 15 | Fill #2 | Status: AC

## 2021-01-10 ENCOUNTER — Other Ambulatory Visit: Payer: Self-pay

## 2021-02-11 ENCOUNTER — Other Ambulatory Visit: Payer: Self-pay

## 2021-02-11 MED FILL — Atorvastatin Calcium Tab 80 MG (Base Equivalent): ORAL | 30 days supply | Qty: 30 | Fill #2 | Status: AC

## 2021-02-11 MED FILL — Isosorbide Mononitrate Tab ER 24HR 30 MG: ORAL | 30 days supply | Qty: 15 | Fill #3 | Status: AC

## 2021-02-11 MED FILL — Prasugrel HCl Tab 10 MG (Base Equiv): ORAL | 30 days supply | Qty: 30 | Fill #3 | Status: AC

## 2021-02-11 MED FILL — Metoprolol Succinate Tab ER 24HR 25 MG (Tartrate Equiv): ORAL | 30 days supply | Qty: 60 | Fill #2 | Status: AC

## 2021-03-13 ENCOUNTER — Other Ambulatory Visit: Payer: Self-pay

## 2021-03-13 DIAGNOSIS — F17200 Nicotine dependence, unspecified, uncomplicated: Secondary | ICD-10-CM | POA: Insufficient documentation

## 2021-03-13 DIAGNOSIS — F419 Anxiety disorder, unspecified: Secondary | ICD-10-CM | POA: Insufficient documentation

## 2021-03-13 DIAGNOSIS — I509 Heart failure, unspecified: Secondary | ICD-10-CM | POA: Insufficient documentation

## 2021-03-13 DIAGNOSIS — K219 Gastro-esophageal reflux disease without esophagitis: Secondary | ICD-10-CM | POA: Insufficient documentation

## 2021-03-13 DIAGNOSIS — F32A Depression, unspecified: Secondary | ICD-10-CM | POA: Insufficient documentation

## 2021-03-13 DIAGNOSIS — E669 Obesity, unspecified: Secondary | ICD-10-CM | POA: Insufficient documentation

## 2021-03-13 MED ORDER — FAMOTIDINE 20 MG PO TABS
ORAL_TABLET | ORAL | 1 refills | Status: AC
  Filled 2021-03-13: qty 60, 30d supply, fill #0

## 2021-03-13 MED ORDER — NITROGLYCERIN 0.4 MG SL SUBL
SUBLINGUAL_TABLET | SUBLINGUAL | 1 refills | Status: DC
  Filled 2021-03-13: qty 25, 30d supply, fill #0

## 2021-03-13 MED ORDER — ALBUTEROL SULFATE HFA 108 (90 BASE) MCG/ACT IN AERS
INHALATION_SPRAY | RESPIRATORY_TRACT | 2 refills | Status: DC
  Filled 2021-03-13: qty 8.5, 25d supply, fill #0

## 2021-03-13 MED ORDER — ATORVASTATIN CALCIUM 80 MG PO TABS
ORAL_TABLET | ORAL | 2 refills | Status: DC
  Filled 2021-03-13: qty 30, 30d supply, fill #0

## 2021-03-13 MED ORDER — PRASUGREL HCL 10 MG PO TABS
ORAL_TABLET | ORAL | 1 refills | Status: DC
  Filled 2021-03-13 – 2021-03-24 (×2): qty 30, 30d supply, fill #0

## 2021-03-13 MED ORDER — CLOPIDOGREL BISULFATE 75 MG PO TABS
ORAL_TABLET | ORAL | 2 refills | Status: DC
  Filled 2021-03-13: qty 30, 30d supply, fill #0

## 2021-03-13 MED ORDER — ANORO ELLIPTA 62.5-25 MCG/INH IN AEPB
INHALATION_SPRAY | RESPIRATORY_TRACT | 2 refills | Status: DC
  Filled 2021-03-13: qty 60, 30d supply, fill #0

## 2021-03-13 MED ORDER — ASPIRIN EC 81 MG PO TBEC: DELAYED_RELEASE_TABLET | ORAL | 1 refills | Status: AC

## 2021-03-13 MED ORDER — ISOSORBIDE MONONITRATE ER 30 MG PO TB24
ORAL_TABLET | ORAL | 1 refills | Status: DC
  Filled 2021-03-13: qty 15, 30d supply, fill #0

## 2021-03-13 MED ORDER — METOPROLOL SUCCINATE ER 25 MG PO TB24
ORAL_TABLET | ORAL | 1 refills | Status: DC
  Filled 2021-03-13: qty 40, 20d supply, fill #0

## 2021-03-13 MED ORDER — BUPROPION HCL ER (XL) 150 MG PO TB24
ORAL_TABLET | ORAL | 2 refills | Status: DC
  Filled 2021-03-13: qty 30, 30d supply, fill #0

## 2021-03-13 MED ORDER — TYLENOL PM EXTRA STRENGTH 500-25 MG PO TABS: ORAL_TABLET | ORAL | 1 refills | Status: DC

## 2021-03-14 ENCOUNTER — Other Ambulatory Visit: Payer: Self-pay

## 2021-03-17 ENCOUNTER — Other Ambulatory Visit: Payer: Self-pay

## 2021-03-24 ENCOUNTER — Other Ambulatory Visit: Payer: Self-pay

## 2021-03-26 ENCOUNTER — Other Ambulatory Visit: Payer: Self-pay

## 2021-04-14 ENCOUNTER — Other Ambulatory Visit: Payer: Self-pay

## 2021-05-05 ENCOUNTER — Ambulatory Visit (HOSPITAL_COMMUNITY)
Admission: RE | Admit: 2021-05-05 | Payer: Self-pay | Source: Ambulatory Visit | Attending: Cardiovascular Disease | Admitting: Cardiovascular Disease

## 2021-05-21 ENCOUNTER — Ambulatory Visit (HOSPITAL_COMMUNITY)
Admission: RE | Admit: 2021-05-21 | Discharge: 2021-05-21 | Disposition: A | Discharge status: Home / Self Care | Payer: Medicaid Other | Source: Ambulatory Visit | Attending: Cardiology | Admitting: Cardiology

## 2021-05-21 ENCOUNTER — Other Ambulatory Visit: Payer: Self-pay

## 2021-05-21 DIAGNOSIS — I739 Peripheral vascular disease, unspecified: Secondary | ICD-10-CM | POA: Insufficient documentation

## 2021-06-09 ENCOUNTER — Telehealth: Payer: Self-pay

## 2021-06-09 NOTE — Telephone Encounter (Signed)
Spoke with pt regarding results. All questions answered. Pt verbalizes understanding.

## 2021-06-09 NOTE — Telephone Encounter (Signed)
Hi Good Morning, I have the pt on the line stating that he just received results in the mail... he has a question in regards to "progression of disease" that is listed on his paperwork...please advise

## 2021-06-16 ENCOUNTER — Other Ambulatory Visit: Payer: Self-pay

## 2021-06-23 ENCOUNTER — Other Ambulatory Visit (HOSPITAL_COMMUNITY): Payer: Self-pay | Admitting: Cardiovascular Disease

## 2021-06-23 DIAGNOSIS — I739 Peripheral vascular disease, unspecified: Secondary | ICD-10-CM

## 2021-07-08 DIAGNOSIS — I2510 Atherosclerotic heart disease of native coronary artery without angina pectoris: Secondary | ICD-10-CM | POA: Insufficient documentation

## 2021-07-08 NOTE — Progress Notes (Signed)
Cardiology Office Note:    Date:  07/09/2021   ID:  Jeffrey Winters, DOB 05/28/1966, MRN [id number]  PCP:  [facility], [place] [facility] Cardiologist:  Sherren Mocha, MD Cardiology APP:  Liliane Shi, [place]  PV Cardiologist:  Quay Burow, MD     Referring MD: Lorretta Harp, MD   Chief Complaint:  Follow-up for CAD    Patient Profile:   Jeffrey Winters is a 55 y.o. male with:  Coronary artery disease S/p NSTEMI in 02/2016 tx with Xience DES to OM1 s/p NSTEMI in 05/2016 S/p Inf STEMI 07/2019 >> s/p DES to RCA; OM1 stent patent Residual RPL2 80% >> Med Rx Myoview 08/2019: small inf ischemia; low risk >> med Rx continued Peripheral Arterial Disease   S/p L SFA PTA 10/2019 S/p R SFA PTA 11/2019 Hypertension  Hyperlipidemia  Tobacco use COPD  History of Present Illness: Jeffrey Winters was last seen in 4/22.  He returns for f/u.  He is here alone.  He has reduced his smoking by about 1/2.  He notes his breathing is somewhat better with this.  He still gets short of breath.  He has occ chest pain.  The overall pattern of chest pain is unchanged.  He has not had syncope, orthopnea.  He has some dependent edema.        ASSESSMENT & PLAN:   CAD (coronary artery disease) s/p myocardial infarction in 2017 treated with DES to the OM1 at [facility].  He is status post inferior STEMI in 07/2019 treated with a DES to the RCA.  He has residual stenosis with 80% in the RPL 2.  Myoview in 08/2019 demonstrated a small amount of inferior ischemia.  He is managed medically.  His overall chest pain pattern is stable.  I recommend continued med Rx.  He was supposed to stop Effient at last visit and replace it with Plavix.  It appears he is taking both.  I think his BP could tolerate increasing his dose of long acting nitrates.  We discussed the importance of earlier f/u if his symptoms worsen so that we can discuss +/- cardiac catheterization.    -DC Prasugrel  (Effient)  -Continue Clopidogrel 75 mg daily, ASA 81 mg daily, atorvastatin 80 mg daily, metoprolol succinate 25 mg daily  -Increase isosorbide mononitrate to 30 mg daily  -Follow-up 76-month  PAD (peripheral artery disease) (HCC) Status post angioplasty to the left SFA and right SFA.  His most recent lower extremity vascular studies demonstrated some progression on the left.  He also has a common femoral to greater saphenous vein AV fistula.  Overall, he seems to be asymptomatic.  Continue follow-up with Dr. BGwenlyn Foundas planned.  Essential hypertension Fair control.  Increase isosorbide mononitrate to 30 mg daily.  Continue metoprolol succinate 25 mg daily.  Hyperlipidemia Continue atorvastatin 80 mg daily.  Arrange fasting CMET, lipids.  Tobacco abuse We have again discussed the importance of cessation.          Dispo:  Return in about 6 months (around 01/07/2022) [facility]    Prior CV studies: LE arterial [place]19[place]22 R SFA 50-74 L SFA 50-99; mod progression  dCFA to GSV AV fistula  Myoview 08/25/2019 EF 61, small area of inferior ischemia; low risk   LE Art [place]/2021:  R SFA 50-74; L SFA 50-74   Echocardiogram 07/11/2019 EF 55, mild LVH, inf HK, Gr  2 DD, normal RVSF, trace MR, trivial TR, RVSP 36.3   Cardiac catheterization 07/10/2019 LAD mid 56, dist 40 LCx irregs; OM2 stent patent RCA mid 100, dist 50; RPL2 80 PCI: 3 x 38 mm Resolute Onyx DES to mid RCA   Cardiac catheterization 02/26/16 ([place]) 2. 2.5 x 18 mm Xience drug eluting stent to the OM2       Past Medical History:  Diagnosis Date   Coronary artery disease 2017   MI s/p PCI to OM2 // s/p MI 07/2019 >> DES to RCA; residual RPL2 80 // Myoview 08/2019: EF 61, small area of mod ischemia inf base; Low Risk   Hypertension    PAD (peripheral artery disease) ([place])    LE Art [place] 08/2019: R SFA 50-74; L SFA 50-74   Current Medications: Current Meds  Medication  Sig   albuterol (VENTOLIN HFA) 108 (90 Base) MCG/ACT inhaler INHALE 1-2 PUFFS INTO THE LUNGS EVERY 6 (SIX) HOURS AS NEEDED FOR WHEEZING OR SHORTNESS OF BREATH.   aspirin EC 81 MG tablet Take 1 tablet orally daily   atorvastatin (LIPITOR) 80 MG tablet Take 1 tablet by mouth daily   buPROPion (WELLBUTRIN XL) 150 MG 24 hr tablet Take 1 tablet by mouth at bedtime   clopidogrel (PLAVIX) 75 MG tablet Take 1 tablet by mouth daily   diphenhydramine-acetaminophen (TYLENOL PM) 25-500 MG TABS tablet Take 0.5 tablets by mouth at bedtime.   escitalopram (LEXAPRO) 10 MG tablet Take 10 mg by mouth daily.   famotidine (PEPCID) 20 MG tablet Take 1 tablet by mouth 2 times daily   HYDROcodone-acetaminophen (NORCO/VICODIN) 5-325 MG tablet Take 1 tablet by mouth every 6 (six) hours as needed for moderate pain.   metoprolol succinate (TOPROL-XL) 25 MG 24 hr tablet Take 1 tablet by mouth daily, may take one additional tablet as needed for palpitations   nitroGLYCERIN (NITROSTAT) 0.4 MG SL tablet Take 1 tablet sublingually every 5 minutes as needed for chest pain   umeclidinium-vilanterol (ANORO ELLIPTA) 62.5-25 MCG/INH AEPB Inhale 1 puff daily.   [DISCONTINUED] isosorbide mononitrate (IMDUR) 30 MG 24 hr tablet Take 1/2 tablet by mouth daily   [DISCONTINUED] prasugrel (EFFIENT) 10 MG TABS tablet Take 1 tablet by mouth daily    Allergies:   Patient has no known allergies.   Social History   Tobacco Use   Smoking status: Every Day    Packs/day: 1.00    Years: 7.00    Pack years: 7.00    Types: Cigarettes   Smokeless tobacco: Never   Tobacco comments:    smokes 1/2 pack per day 06/19/2020  Substance Use Topics   Alcohol use: Not Currently   Drug use: No    Family Hx: The patient's family history includes Heart disease in his father and mother.  Review of Systems  Cardiovascular:  Negative for claudication (no clear symptoms of claudication).  Neurological:  Positive for paresthesias (positional numbness  in legs).    EKGs/Labs/Other Test Reviewed:    EKG:  EKG is  ordered today.  The ekg ordered today demonstrates NSR, HR 62, normal axis, no ST-T wave changes, QTC 385  Recent Labs: 11/26/2020: BUN 15; Creatinine, Ser 1.02; Hemoglobin 13.7; Platelets 175; Potassium 4.6; Sodium 137   Recent Lipid Panel Lab Results  Component Value Date/Time   CHOL 115 11/07/2019 02:32 PM   TRIG 117 11/07/2019 02:32 PM   HDL 35 (L) 11/07/2019 02:32 PM   LDLCALC 59 11/07/2019 02:32 PM    Risk Assessment/Calculations:  Physical Exam:    VS:  BP 130/62   Pulse 62   Ht '5\' 8"'  (1.727 m)   Wt 234 lb 9.6 oz (106.4 kg)   SpO2 97%   BMI 35.67 kg/m     Wt Readings from Last 3 Encounters:  07/09/21 234 lb 9.6 oz (106.4 kg)  11/24/20 232 lb 2.3 oz (105.3 kg)  11/20/20 232 lb 3.2 oz (105.3 kg)    Constitutional:      Appearance: Healthy appearance. Not in distress.  Neck:     Vascular: No JVR. JVD normal.  Pulmonary:     Effort: Pulmonary effort is normal.     Breath sounds: No wheezing. No rales.  Cardiovascular:     Normal rate. Regular rhythm. Normal S1. Normal S2.      Murmurs: There is no murmur.  Edema:    Peripheral edema absent.  Abdominal:     Palpations: Abdomen is soft.  Skin:    General: Skin is warm and dry.  Neurological:     General: No focal deficit present.     Mental Status: Alert and oriented to person, place and time.     Cranial Nerves: Cranial nerves are intact.       Medication Adjustments/Labs and Tests Ordered: Current medicines are reviewed at length with the patient today.  Concerns regarding medicines are outlined above.  Tests Ordered: Orders Placed This Encounter  Procedures   Lipid Profile   Comp Met (CMET)   EKG 12-Lead   Medication Changes: Meds ordered this encounter  Medications   isosorbide mononitrate (IMDUR) 30 MG 24 hr tablet    Sig: Take 1 tablet (30 mg total) by mouth daily.    Dispense:  30 tablet    Refill:  [address] [name], [place]  07/09/2021 3:57 PM    [facility] [place], [place], [place]  [address] Phone: [phone]; Fax: [phone]

## 2021-07-09 ENCOUNTER — Other Ambulatory Visit: Payer: Self-pay

## 2021-07-09 ENCOUNTER — Ambulatory Visit: Payer: Medicaid Other | Admitting: Physician Assistant

## 2021-07-09 ENCOUNTER — Encounter: Payer: Self-pay | Admitting: Physician Assistant

## 2021-07-09 VITALS — BP 130/62 | HR 62 | Ht 68.0 in | Wt 234.6 lb

## 2021-07-09 DIAGNOSIS — I2510 Atherosclerotic heart disease of native coronary artery without angina pectoris: Secondary | ICD-10-CM

## 2021-07-09 DIAGNOSIS — I739 Peripheral vascular disease, unspecified: Secondary | ICD-10-CM | POA: Diagnosis not present

## 2021-07-09 DIAGNOSIS — E782 Mixed hyperlipidemia: Secondary | ICD-10-CM | POA: Diagnosis not present

## 2021-07-09 DIAGNOSIS — Z720 Tobacco use: Secondary | ICD-10-CM

## 2021-07-09 DIAGNOSIS — I10 Essential (primary) hypertension: Secondary | ICD-10-CM | POA: Diagnosis not present

## 2021-07-09 MED ORDER — ISOSORBIDE MONONITRATE ER 30 MG PO TB24: 30.0000 mg | ORAL_TABLET | Freq: Every day | ORAL | 11 refills | Status: DC

## 2021-07-09 NOTE — Assessment & Plan Note (Signed)
Continue atorvastatin 80 mg daily.  Arrange fasting CMET, lipids.

## 2021-07-09 NOTE — Assessment & Plan Note (Signed)
Fair control.  Increase isosorbide mononitrate to 30 mg daily.  Continue metoprolol succinate 25 mg daily.

## 2021-07-09 NOTE — Patient Instructions (Signed)
Medication Instructions:   INCREASE Imdur one (1) tablet by mouth (30 mg) daily.   *If you need a refill on your cardiac medications before your next appointment, please call your pharmacy*   Lab Work:  Your physician recommends that you return for a FASTING lipid profile/cmet Wednesday, February 8. You can come in on the day of your appointment anytime between 7:30-4:30 fasting from midnight the night before.    If you have labs (blood work) drawn today and your tests are completely normal, you will receive your results only by: [place] (if you have MyChart) OR A paper copy in the mail If you have any lab test that is abnormal or we need to change your treatment, we will call you to review the results.   Testing/Procedures:  -NONE-   Follow-Up: At [facility], you and your health needs are our priority.  As part of our continuing mission to provide you with exceptional heart care, we have created designated Provider Care Teams.  These Care Teams include your primary Cardiologist (physician) and Advanced Practice Providers (APPs -  Physician Assistants and Nurse Practitioners) who all work together to provide you with the care you need, when you need it.  We recommend signing up for the patient portal called "MyChart".  Sign up information is provided on this After Visit Summary.  MyChart is used to connect with patients for Virtual Visits (Telemedicine).  Patients are able to view lab/test results, encounter notes, upcoming appointments, etc.  Non-urgent messages can be sent to your provider as well.   To learn more about what you can do with MyChart, go to [email or web address].    Your next appointment:   6 month(s)  The format for your next appointment:   In Person  Provider:   [name], PA-C         Other Instructions  -NONE

## 2021-07-09 NOTE — Assessment & Plan Note (Signed)
s/p myocardial infarction in 2017 treated with DES to the OM1 at [facility].  He is status post inferior STEMI in 07/2019 treated with a DES to the RCA.  He has residual stenosis with 80% in the RPL 2.  Myoview in 08/2019 demonstrated a small amount of inferior ischemia.  He is managed medically.  His overall chest pain pattern is stable.  I recommend continued med Rx.  He was supposed to stop Effient at last visit and replace it with Plavix.  It appears he is taking both.  I think his BP could tolerate increasing his dose of long acting nitrates.  We discussed the importance of earlier f/u if his symptoms worsen so that we can discuss +/- cardiac catheterization.    -DC Prasugrel (Effient)  -Continue Clopidogrel 75 mg daily, ASA 81 mg daily, atorvastatin 80 mg daily, metoprolol succinate 25 mg daily  -Increase isosorbide mononitrate to 30 mg daily  -Follow-up 74-months

## 2021-07-09 NOTE — Assessment & Plan Note (Signed)
We have again discussed the importance of cessation.

## 2021-07-09 NOTE — Assessment & Plan Note (Signed)
Status post angioplasty to the left SFA and right SFA.  His most recent lower extremity vascular studies demonstrated some progression on the left.  He also has a common femoral to greater saphenous vein AV fistula.  Overall, he seems to be asymptomatic.  Continue follow-up with Dr. Gwenlyn Found as planned.

## 2021-09-10 ENCOUNTER — Other Ambulatory Visit: Payer: Self-pay

## 2021-09-10 ENCOUNTER — Ambulatory Visit (INDEPENDENT_AMBULATORY_CARE_PROVIDER_SITE_OTHER): Payer: Medicaid Other

## 2021-09-10 ENCOUNTER — Other Ambulatory Visit: Payer: Medicaid Other | Admitting: *Deleted

## 2021-09-10 VITALS — BP 120/70 | HR 80 | Ht 66.0 in | Wt 232.0 lb

## 2021-09-10 DIAGNOSIS — I739 Peripheral vascular disease, unspecified: Secondary | ICD-10-CM

## 2021-09-10 DIAGNOSIS — I2510 Atherosclerotic heart disease of native coronary artery without angina pectoris: Secondary | ICD-10-CM

## 2021-09-10 DIAGNOSIS — E782 Mixed hyperlipidemia: Secondary | ICD-10-CM | POA: Diagnosis not present

## 2021-09-10 DIAGNOSIS — I10 Essential (primary) hypertension: Secondary | ICD-10-CM

## 2021-09-10 LAB — COMPREHENSIVE METABOLIC PANEL
ALT: 29 IU/L (ref 0–44)
AST: 30 IU/L (ref 0–40)
Albumin/Globulin Ratio: 1.7 (ref 1.2–2.2)
Albumin: 4.1 g/dL (ref 3.8–4.9)
Alkaline Phosphatase: 93 IU/L (ref 44–121)
BUN/Creatinine Ratio: 14 (ref 9–20)
BUN: 14 mg/dL (ref 6–24)
Bilirubin Total: 0.5 mg/dL (ref 0.0–1.2)
CO2: 26 mmol/L (ref 20–29)
Calcium: 9.4 mg/dL (ref 8.7–10.2)
Chloride: 101 mmol/L (ref 96–106)
Creatinine, Ser: 1 mg/dL (ref 0.76–1.27)
Globulin, Total: 2.4 g/dL (ref 1.5–4.5)
Glucose: 99 mg/dL (ref 70–99)
Potassium: 4.2 mmol/L (ref 3.5–5.2)
Sodium: 138 mmol/L (ref 134–144)
Total Protein: 6.5 g/dL (ref 6.0–8.5)
eGFR: 88 mL/min/{1.73_m2} (ref >59)

## 2021-09-10 LAB — LIPID PANEL
Chol/HDL Ratio: 3.9 ratio (ref 0.0–5.0)
Cholesterol, Total: 143 mg/dL (ref 100–199)
HDL: 37 mg/dL — ABNORMAL LOW (ref >39)
LDL Chol Calc (NIH): 78 mg/dL (ref 0–99)
Triglycerides: 164 mg/dL — ABNORMAL HIGH (ref 0–149)
VLDL Cholesterol Cal: 28 mg/dL (ref 5–40)

## 2021-09-10 MED ORDER — ATORVASTATIN CALCIUM 80 MG PO TABS: ORAL_TABLET | ORAL | 1 refills | Status: DC

## 2021-09-10 MED ORDER — ISOSORBIDE MONONITRATE ER 30 MG PO TB24: 30.0000 mg | ORAL_TABLET | Freq: Every day | ORAL | 1 refills | Status: DC

## 2021-09-10 MED ORDER — METOPROLOL SUCCINATE ER 25 MG PO TB24: ORAL_TABLET | ORAL | 1 refills | Status: DC

## 2021-09-10 MED ORDER — CLOPIDOGREL BISULFATE 75 MG PO TABS: ORAL_TABLET | ORAL | 1 refills | Status: DC

## 2021-09-10 NOTE — Patient Instructions (Signed)
Your physician has requested that you regularly monitor and record your blood pressure readings at home. Please use the same machine at the same time of day to check your readings and record them to bring to your follow-up visit.

## 2021-09-10 NOTE — Progress Notes (Signed)
Reason for visit: BP check/Refills  Name of provider requesting visit: [name], PA  H&P: CAD, HTN, HLD  Assessment and plan per provider: Patient presents for labs today and reports being out of all of his meds for 3 weeks. Vitals checked showing BP 120/70 HR 80 bpm. Denies having any Sx other than intermittent palpitations. Refills sent to patient's preferred pharmacy for atorvastatin, plavix, imdur, and Toprol. Made patient aware that other meds need to be refilled by PCP/Pulmonary MD per [name], PA. Patient does not have a BP cuff at home and does not know how to check his BP. BP cuff provided to patient along with BP log. Patient educated on how to check BP. No other changes/recommendations at this time. Patient to keep appointment with [name], PA on 12/31/21.

## 2021-09-11 ENCOUNTER — Ambulatory Visit: Payer: Medicaid Other | Admitting: Podiatry

## 2021-09-11 ENCOUNTER — Other Ambulatory Visit: Payer: Self-pay

## 2021-09-11 DIAGNOSIS — M79674 Pain in right toe(s): Secondary | ICD-10-CM | POA: Diagnosis not present

## 2021-09-11 DIAGNOSIS — M79675 Pain in left toe(s): Secondary | ICD-10-CM | POA: Diagnosis not present

## 2021-09-11 DIAGNOSIS — Z79899 Other long term (current) drug therapy: Secondary | ICD-10-CM | POA: Diagnosis not present

## 2021-09-11 DIAGNOSIS — B351 Tinea unguium: Secondary | ICD-10-CM | POA: Diagnosis not present

## 2021-09-11 DIAGNOSIS — I739 Peripheral vascular disease, unspecified: Secondary | ICD-10-CM

## 2021-09-11 DIAGNOSIS — G629 Polyneuropathy, unspecified: Secondary | ICD-10-CM

## 2021-09-11 MED ORDER — TERBINAFINE HCL 250 MG PO TABS
250.0000 mg | ORAL_TABLET | Freq: Every day | ORAL | 0 refills | Status: DC
  Filled 2021-09-11: qty 30, 30d supply, fill #0

## 2021-09-11 MED ORDER — AMMONIUM LACTATE 12 % EX LOTN
1.0000 "application " | TOPICAL_LOTION | CUTANEOUS | 0 refills | Status: DC | PRN
  Filled 2021-09-11: qty 400, fill #0

## 2021-09-11 NOTE — Progress Notes (Signed)
Agree. [name], PA-C    09/11/2021 8:35 AM

## 2021-09-12 ENCOUNTER — Telehealth: Payer: Self-pay | Admitting: Cardiovascular Disease

## 2021-09-12 DIAGNOSIS — E782 Mixed hyperlipidemia: Secondary | ICD-10-CM

## 2021-09-12 NOTE — Telephone Encounter (Signed)
-----   Message from Liliane Shi, PA-C sent at 09/10/2021 11:00 PM EST ----- LDL and Triglycerides increased. Creatinine, K+, LFTs normal.  PLAN:  -Work on diet to get cholesterol back down.   -Continue current meds -Recheck Lipids in 6 mos. [name], PA-C    09/10/2021 10:58 PM

## 2021-09-12 NOTE — Telephone Encounter (Signed)
Patient is returning call to discuss lab results. 

## 2021-09-15 NOTE — Progress Notes (Signed)
Subjective:   Patient ID: Jeffrey Winters, male   DOB: 56 y.o.   MRN: [id number]   HPI 56 year old male presents the office today for concerns of a right foot plantar wound that started about 5 years ago.  He states that it hurts with walking and feels that he is walking on a needle at times.  His primary care doctor did try to freeze the area without significant improvement.  His nails are also thickened and discolored and he cannot trim himself.  He does have history of stents in both legs.   Review of Systems  All other systems reviewed and are negative.  Past Medical History:  Diagnosis Date   Coronary artery disease 2017   MI s/p PCI to OM2 // s/p MI 07/2019 >> DES to RCA; residual RPL2 80 // Myoview 08/2019: EF 61, small area of mod ischemia inf base; Low Risk   Hypertension    PAD (peripheral artery disease) ([place])    LE Art [place] 08/2019: R SFA 50-74; L SFA 50-74    Past Surgical History:  Procedure Laterality Date   ABDOMINAL AORTOGRAM W/LOWER EXTREMITY Bilateral 10/05/2019   Procedure: ABDOMINAL AORTOGRAM W/LOWER EXTREMITY;  Surgeon: Lorretta Harp, MD;  Location: [place] [facility];  Service: Cardiovascular;  Laterality: Bilateral;   ABDOMINAL AORTOGRAM W/LOWER EXTREMITY N/A 11/09/2019   Procedure: ABDOMINAL AORTOGRAM W/LOWER EXTREMITY;  Surgeon: Lorretta Harp, MD;  Location: [place] [facility];  Service: Cardiovascular;  Laterality: N/A;   CORONARY/GRAFT ACUTE MI REVASCULARIZATION N/A 07/10/2019   Procedure: CORONARY/GRAFT ACUTE MI REVASCULARIZATION;  Surgeon: Nelva Bush, MD;  Location: [place] [facility];  Service: Cardiovascular;  Laterality: N/A;   INTRAVASCULAR LITHOTRIPSY  11/09/2019   Procedure: INTRAVASCULAR LITHOTRIPSY;  Surgeon: Lorretta Harp, MD;  Location: [place] [facility];  Service: Cardiovascular;;   LEFT HEART CATH AND CORONARY ANGIOGRAPHY N/A 07/10/2019   Procedure: LEFT HEART CATH AND CORONARY ANGIOGRAPHY;  Surgeon: Nelva Bush, MD;  Location: [place] [facility];  Service: Cardiovascular;  Laterality: N/A;   PERCUTANEOUS CORONARY STENT INTERVENTION (PCI-S)  2017   DES to OM2 at [facility] Left 10/05/2019   Procedure: PERIPHERAL VASCULAR ATHERECTOMY;  Surgeon: Lorretta Harp, MD;  Location: [place] [facility];  Service: Cardiovascular;  Laterality: Left;  SFA   PERIPHERAL VASCULAR BALLOON ANGIOPLASTY  11/09/2019   Procedure: PERIPHERAL VASCULAR BALLOON ANGIOPLASTY;  Surgeon: Lorretta Harp, MD;  Location: [place] [facility];  Service: Cardiovascular;;   PERIPHERAL VASCULAR INTERVENTION Left 10/05/2019   Procedure: PERIPHERAL VASCULAR INTERVENTION;  Surgeon: Lorretta Harp, MD;  Location: [place] [facility];  Service: Cardiovascular;  Laterality: Left;  SFA     Current Outpatient Medications:    ammonium lactate (AMLACTIN) 12 % lotion, Apply 1 application topically as needed for dry skin., Disp: 400 g, Rfl: 0   terbinafine (LAMISIL) 250 MG tablet, Take 1 tablet (250 mg total) by mouth daily., Disp: 90 tablet, Rfl: 0   albuterol (VENTOLIN HFA) 108 (90 Base) MCG/ACT inhaler, INHALE 1-2 PUFFS INTO THE LUNGS EVERY 6 (SIX) HOURS AS NEEDED FOR WHEEZING OR SHORTNESS OF BREATH., Disp: 8 g, Rfl: 2   aspirin EC 81 MG tablet, Take 1 tablet orally daily, Disp: 90 tablet, Rfl: 1   atorvastatin (LIPITOR) 80 MG tablet, Take 1 tablet by mouth daily, Disp: 90 tablet, Rfl: 1   buPROPion (WELLBUTRIN XL) 150 MG 24 hr tablet, Take 1 tablet by mouth at bedtime (Patient not  taking: Reported on 09/10/2021), Disp: 30 tablet, Rfl: 2   clopidogrel (PLAVIX) 75 MG tablet, Take 1 tablet by mouth daily, Disp: 90 tablet, Rfl: 1   diphenhydramine-acetaminophen (TYLENOL PM) 25-500 MG TABS tablet, Take 0.5 tablets by mouth at bedtime., Disp: , Rfl:    escitalopram (LEXAPRO) 10 MG tablet, Take 10 mg by mouth daily., Disp: , Rfl:    famotidine (PEPCID) 20 MG tablet, Take 1 tablet by mouth 2 times daily (Patient not taking: Reported on 09/10/2021),  Disp: 60 tablet, Rfl: 1   HYDROcodone-acetaminophen (NORCO/VICODIN) 5-325 MG tablet, Take 1 tablet by mouth every 6 (six) hours as needed for moderate pain., Disp: , Rfl:    isosorbide mononitrate (IMDUR) 30 MG 24 hr tablet, Take 1 tablet (30 mg total) by mouth daily., Disp: 90 tablet, Rfl: 1   metoprolol succinate (TOPROL-XL) 25 MG 24 hr tablet, Take 1 tablet by mouth daily, may take one additional tablet as needed for palpitations, Disp: 90 tablet, Rfl: 1   nitroGLYCERIN (NITROSTAT) 0.4 MG SL tablet, Take 1 tablet sublingually every 5 minutes as needed for chest pain, Disp: 25 tablet, Rfl: 1   umeclidinium-vilanterol (ANORO ELLIPTA) 62.5-25 MCG/INH AEPB, Inhale 1 puff daily. (Patient not taking: Reported on 09/10/2021), Disp: 60 each, Rfl: 2  No Known Allergies       Objective:  Physical Exam  General: AAO x3, NAD  Dermatological: Nails are hypertrophic, dystrophic, brittle, discolored, elongated 10. No surrounding redness or drainage. Tenderness nails 1-5 bilaterally.  Thickened hyperkeratotic lesion right foot submetatarsal 5.  Upon debridement there is no ongoing ulceration drainage or signs of infection.  No evidence of foreign body.  No open lesions or pre-ulcerative lesions are identified today.  Vascular: Dorsalis Pedis artery and Posterior Tibial artery pedal pulses are palpable bilateral with immedate capillary fill time.  There is no pain with calf compression, swelling, warmth, erythema.   Neruologic: Sensation decreased with Semmes Weinstein monofilament.  Musculoskeletal: Tenderness the hyperkeratotic lesion but no other areas of discomfort.  Muscular strength 5/5 in all groups tested bilateral.  Gait: Unassisted, Nonantalgic.       Assessment:   56 year old male with symptomatic onychomycosis, hyperkeratotic lesion, PAD     Plan:  -Treatment options discussed including all alternatives, risks, and complications -Etiology of symptoms were discussed -Nails debrided  10 without complications or bleeding.  Discussed treatment options for nail fungus.  Will start Lamisil.  Discussed side effects medication.  Check CBC and LFT prior to starting medication. -Sharply debrided the skin lesion right foot without any complications or bleeding.  Recommend moisturizer and offloading. -Daily foot inspection -Follow-up in 3 months or sooner if any problems arise. In the meantime, encouraged to call the office with any questions, concerns, change in symptoms.   Celesta Gentile, DPM

## 2021-10-23 ENCOUNTER — Other Ambulatory Visit: Payer: Self-pay

## 2021-10-23 ENCOUNTER — Ambulatory Visit: Payer: Medicaid Other | Admitting: Podiatry

## 2021-10-23 DIAGNOSIS — D492 Neoplasm of unspecified behavior of bone, soft tissue, and skin: Secondary | ICD-10-CM

## 2021-10-23 DIAGNOSIS — Z79899 Other long term (current) drug therapy: Secondary | ICD-10-CM

## 2021-10-23 DIAGNOSIS — L989 Disorder of the skin and subcutaneous tissue, unspecified: Secondary | ICD-10-CM

## 2021-10-23 DIAGNOSIS — I739 Peripheral vascular disease, unspecified: Secondary | ICD-10-CM

## 2021-10-25 NOTE — Progress Notes (Signed)
Subjective: ?56 year old male presents the office today for evaluation of skin lesion, wart on the right foot as well as for nail fungus.  Is on Lamisil any side effects and has seen some mild improvement.  The skin lesions came back after couple weeks causing discomfort again.  No swelling redness or any drainage. ? ?History of stents in legs. ? ?Objective: ?AAO x3, NAD ?DP pulse 2/4, PT pulse 1/4.   ?Nails have some mild clear on the proximal nail fold but overall still remain hypertrophic, dystrophic with yellow discoloration.  There is no pain in the nails there is no swelling or redness or any signs of infection. ?Hyperkeratotic lesion present on the plantar aspect of the right foot.  This has been ongoing for quite some time.  Upon review there is no ulceration noted but there is thick callus tissue and there is hyperpigmentation noted to it today but is uniform in color. ?No open lesions or pre-ulcerative lesions.  ?No pain with calf compression, swelling, warmth, erythema ? ?Assessment: ?Skin lesion left foot, onychomycosis ? ?Plan: ?-All treatment options discussed with the patient including all alternatives, risks, complications.  ?-He is currently on Lamisil and tolerating well.  We will recheck a CBC and LFT. ?-Sharply debrided the hyperkeratotic lesions to any complications on the plantar right foot.  The target appear to be more dried blood present there is no ulceration noted today.  After debridement there is no drainage or pus or signs of infection or ulceration.  Continue moisturizer and offloading.  Discussed with them excision of the lesion, biopsy.  Repeat ABI prior to this. ?-Patient encouraged to call the office with any questions, concerns, change in symptoms.  ? ?Trula Slade DPM ? ?

## 2021-11-01 LAB — CBC WITH DIFFERENTIAL/PLATELET
Absolute Monocytes: 629 cells/uL (ref 200–950)
Basophils Absolute: 31 cells/uL (ref 0–200)
Basophils Relative: 0.7 %
Eosinophils Absolute: 88 cells/uL (ref 15–500)
Eosinophils Relative: 2 %
HCT: 42.6 % (ref 38.5–50.0)
Hemoglobin: 13.8 g/dL (ref 13.2–17.1)
Lymphs Abs: 2055 cells/uL (ref 850–3900)
MCH: 27.7 pg (ref 27.0–33.0)
MCHC: 32.4 g/dL (ref 32.0–36.0)
MCV: 85.4 fL (ref 80.0–100.0)
MPV: 10.2 fL (ref 7.5–12.5)
Monocytes Relative: 14.3 %
Neutro Abs: 1597 cells/uL (ref 1500–7800)
Neutrophils Relative %: 36.3 %
Platelets: 171 10*3/uL (ref 140–400)
RBC: 4.99 10*6/uL (ref 4.20–5.80)
RDW: 14.1 % (ref 11.0–15.0)
Total Lymphocyte: 46.7 %
WBC: 4.4 10*3/uL (ref 3.8–10.8)

## 2021-11-01 LAB — BASIC METABOLIC PANEL
BUN: 19 mg/dL (ref 7–25)
CO2: 28 mmol/L (ref 20–32)
Calcium: 9.1 mg/dL (ref 8.6–10.3)
Chloride: 105 mmol/L (ref 98–110)
Creat: 0.99 mg/dL (ref 0.70–1.30)
Glucose, Bld: 96 mg/dL (ref 65–139)
Potassium: 4.5 mmol/L (ref 3.5–5.3)
Sodium: 140 mmol/L (ref 135–146)

## 2021-11-05 ENCOUNTER — Other Ambulatory Visit: Payer: Self-pay | Admitting: Podiatry

## 2021-11-05 DIAGNOSIS — Z79899 Other long term (current) drug therapy: Secondary | ICD-10-CM

## 2021-11-07 ENCOUNTER — Other Ambulatory Visit: Payer: Self-pay

## 2021-11-13 LAB — CBC WITH DIFFERENTIAL/PLATELET
Absolute Monocytes: 632 cells/uL (ref 200–950)
Basophils Absolute: 29 cells/uL (ref 0–200)
Basophils Relative: 0.6 %
Eosinophils Absolute: 49 cells/uL (ref 15–500)
Eosinophils Relative: 1 %
HCT: 44.2 % (ref 38.5–50.0)
Hemoglobin: 14.3 g/dL (ref 13.2–17.1)
Lymphs Abs: 2146 cells/uL (ref 850–3900)
MCH: 28 pg (ref 27.0–33.0)
MCHC: 32.4 g/dL (ref 32.0–36.0)
MCV: 86.5 fL (ref 80.0–100.0)
MPV: 10 fL (ref 7.5–12.5)
Monocytes Relative: 12.9 %
Neutro Abs: 2043 cells/uL (ref 1500–7800)
Neutrophils Relative %: 41.7 %
Platelets: 171 10*3/uL (ref 140–400)
RBC: 5.11 10*6/uL (ref 4.20–5.80)
RDW: 13.8 % (ref 11.0–15.0)
Total Lymphocyte: 43.8 %
WBC: 4.9 10*3/uL (ref 3.8–10.8)

## 2021-11-13 LAB — HEPATIC FUNCTION PANEL
AG Ratio: 1.4 (calc) (ref 1.0–2.5)
ALT: 21 U/L (ref 9–46)
AST: 21 U/L (ref 10–35)
Albumin: 3.9 g/dL (ref 3.6–5.1)
Alkaline phosphatase (APISO): 80 U/L (ref 35–144)
Bilirubin, Direct: 0.1 mg/dL (ref 0.0–0.2)
Globulin: 2.7 g/dL (calc) (ref 1.9–3.7)
Indirect Bilirubin: 0.2 mg/dL (calc) (ref 0.2–1.2)
Total Bilirubin: 0.3 mg/dL (ref 0.2–1.2)
Total Protein: 6.6 g/dL (ref 6.1–8.1)

## 2021-11-18 ENCOUNTER — Other Ambulatory Visit: Payer: Self-pay | Admitting: Podiatry

## 2021-11-18 DIAGNOSIS — I739 Peripheral vascular disease, unspecified: Secondary | ICD-10-CM

## 2021-11-24 ENCOUNTER — Ambulatory Visit (HOSPITAL_COMMUNITY)
Admission: RE | Admit: 2021-11-24 | Discharge: 2021-11-24 | Disposition: A | Discharge status: Home / Self Care | Payer: Medicaid Other | Source: Ambulatory Visit | Attending: Cardiology | Admitting: Cardiology

## 2021-11-24 DIAGNOSIS — I739 Peripheral vascular disease, unspecified: Secondary | ICD-10-CM | POA: Diagnosis not present

## 2021-11-27 ENCOUNTER — Other Ambulatory Visit: Payer: Self-pay | Admitting: *Deleted

## 2021-11-27 DIAGNOSIS — I739 Peripheral vascular disease, unspecified: Secondary | ICD-10-CM

## 2021-11-28 ENCOUNTER — Telehealth: Payer: Self-pay | Admitting: Cardiovascular Disease

## 2021-11-28 NOTE — Telephone Encounter (Signed)
Spoke with patient - relayed results from Dr. Gwenlyn Found on ABI results -- need for LEA  ? ?Advised will message scheduler  ?

## 2021-11-28 NOTE — Telephone Encounter (Signed)
Follow Up:  ? ? ? ?Patient said he was returning a call from this morning, but he did not know who called. ?

## 2021-12-09 ENCOUNTER — Encounter (HOSPITAL_COMMUNITY): Payer: Self-pay | Admitting: Emergency Medicine

## 2021-12-09 ENCOUNTER — Observation Stay (HOSPITAL_COMMUNITY)
Admission: EM | Admit: 2021-12-09 | Discharge: 2021-12-11 | Disposition: A | Discharge status: Home / Self Care | Payer: Medicaid Other | Attending: Cardiology | Admitting: Cardiology

## 2021-12-09 ENCOUNTER — Emergency Department (HOSPITAL_COMMUNITY): Payer: Medicaid Other

## 2021-12-09 ENCOUNTER — Other Ambulatory Visit: Payer: Self-pay

## 2021-12-09 DIAGNOSIS — R079 Chest pain, unspecified: Secondary | ICD-10-CM | POA: Diagnosis present

## 2021-12-09 DIAGNOSIS — I252 Old myocardial infarction: Secondary | ICD-10-CM | POA: Diagnosis not present

## 2021-12-09 DIAGNOSIS — I10 Essential (primary) hypertension: Secondary | ICD-10-CM | POA: Diagnosis present

## 2021-12-09 DIAGNOSIS — I200 Unstable angina: Secondary | ICD-10-CM | POA: Diagnosis not present

## 2021-12-09 DIAGNOSIS — Z955 Presence of coronary angioplasty implant and graft: Secondary | ICD-10-CM | POA: Insufficient documentation

## 2021-12-09 DIAGNOSIS — Z720 Tobacco use: Secondary | ICD-10-CM | POA: Diagnosis present

## 2021-12-09 DIAGNOSIS — I739 Peripheral vascular disease, unspecified: Secondary | ICD-10-CM

## 2021-12-09 DIAGNOSIS — I209 Angina pectoris, unspecified: Secondary | ICD-10-CM

## 2021-12-09 DIAGNOSIS — J449 Chronic obstructive pulmonary disease, unspecified: Secondary | ICD-10-CM | POA: Diagnosis not present

## 2021-12-09 DIAGNOSIS — Z79899 Other long term (current) drug therapy: Secondary | ICD-10-CM | POA: Diagnosis not present

## 2021-12-09 DIAGNOSIS — I25110 Atherosclerotic heart disease of native coronary artery with unstable angina pectoris: Principal | ICD-10-CM | POA: Insufficient documentation

## 2021-12-09 DIAGNOSIS — F17210 Nicotine dependence, cigarettes, uncomplicated: Secondary | ICD-10-CM | POA: Diagnosis not present

## 2021-12-09 DIAGNOSIS — Z7982 Long term (current) use of aspirin: Secondary | ICD-10-CM | POA: Insufficient documentation

## 2021-12-09 DIAGNOSIS — E785 Hyperlipidemia, unspecified: Secondary | ICD-10-CM | POA: Diagnosis present

## 2021-12-09 HISTORY — DX: Acute myocardial infarction, unspecified: I21.9

## 2021-12-09 LAB — CBC
HCT: 42.8 % (ref 39.0–52.0)
HCT: 43.8 % (ref 39.0–52.0)
Hemoglobin: 14 g/dL (ref 13.0–17.0)
Hemoglobin: 14.5 g/dL (ref 13.0–17.0)
MCH: 28.1 pg (ref 26.0–34.0)
MCH: 28.4 pg (ref 26.0–34.0)
MCHC: 32.7 g/dL (ref 30.0–36.0)
MCHC: 33.1 g/dL (ref 30.0–36.0)
MCV: 85.8 fL (ref 80.0–100.0)
MCV: 85.7 fL (ref 80.0–100.0)
Platelets: 164 10*3/uL (ref 150–400)
Platelets: 168 10*3/uL (ref 150–400)
RBC: 4.99 MIL/uL (ref 4.22–5.81)
RBC: 5.11 MIL/uL (ref 4.22–5.81)
RDW: 13.7 % (ref 11.5–15.5)
RDW: 13.8 % (ref 11.5–15.5)
WBC: 4.2 10*3/uL (ref 4.0–10.5)
WBC: 4.8 10*3/uL (ref 4.0–10.5)
nRBC: 0 % (ref 0.0–0.2)
nRBC: 0 % (ref 0.0–0.2)

## 2021-12-09 LAB — TROPONIN I (HIGH SENSITIVITY)
Troponin I (High Sensitivity): 11 ng/L (ref <18)
Troponin I (High Sensitivity): 9 ng/L (ref <18)
Troponin I (High Sensitivity): 9 ng/L (ref <18)

## 2021-12-09 LAB — BASIC METABOLIC PANEL
Anion gap: 7 (ref 5–15)
BUN: 16 mg/dL (ref 6–20)
CO2: 26 mmol/L (ref 22–32)
Calcium: 8.9 mg/dL (ref 8.9–10.3)
Chloride: 104 mmol/L (ref 98–111)
Creatinine, Ser: 1.05 mg/dL (ref 0.61–1.24)
GFR, Estimated: >60 mL/min (ref >60)
Glucose, Bld: 89 mg/dL (ref 70–99)
Potassium: 3.7 mmol/L (ref 3.5–5.1)
Sodium: 137 mmol/L (ref 135–145)

## 2021-12-09 LAB — CREATININE, SERUM
Creatinine, Ser: 0.97 mg/dL (ref 0.61–1.24)
GFR, Estimated: >60 mL/min (ref >60)

## 2021-12-09 MED ORDER — CLOPIDOGREL BISULFATE 75 MG PO TABS
75.0000 mg | ORAL_TABLET | Freq: Every day | ORAL | Status: DC
Start: 2021-12-10 — End: 2021-12-11
  Administered 2021-12-10 – 2021-12-11 (×2): 75 mg via ORAL
  Filled 2021-12-09 (×2): qty 1

## 2021-12-09 MED ORDER — BUPROPION HCL ER (XL) 150 MG PO TB24
150.0000 mg | ORAL_TABLET | Freq: Every day | ORAL | Status: DC
Start: 2021-12-10 — End: 2021-12-09

## 2021-12-09 MED ORDER — ESCITALOPRAM OXALATE 10 MG PO TABS
10.0000 mg | ORAL_TABLET | Freq: Every day | ORAL | Status: DC
  Administered 2021-12-10 – 2021-12-11 (×2): 10 mg via ORAL
  Filled 2021-12-09 (×2): qty 1

## 2021-12-09 MED ORDER — ASPIRIN 300 MG RE SUPP: 300.0000 mg | RECTAL | Status: DC

## 2021-12-09 MED ORDER — HEPARIN SODIUM (PORCINE) 5000 UNIT/ML IJ SOLN
5000.0000 [IU] | Freq: Three times a day (TID) | INTRAMUSCULAR | Status: DC
  Administered 2021-12-09 – 2021-12-10 (×4): 5000 [IU] via SUBCUTANEOUS
  Filled 2021-12-09 (×4): qty 1

## 2021-12-09 MED ORDER — ASPIRIN EC 81 MG PO TBEC
81.0000 mg | DELAYED_RELEASE_TABLET | Freq: Every day | ORAL | Status: DC
  Administered 2021-12-10 – 2021-12-11 (×2): 81 mg via ORAL
  Filled 2021-12-09 (×2): qty 1

## 2021-12-09 MED ORDER — ASPIRIN 81 MG PO CHEW
324.0000 mg | CHEWABLE_TABLET | ORAL | Status: AC
  Filled 2021-12-09: qty 4

## 2021-12-09 MED ORDER — ACETAMINOPHEN 325 MG PO TABS
650.0000 mg | ORAL_TABLET | ORAL | Status: DC | PRN
  Administered 2021-12-09: 650 mg via ORAL
  Filled 2021-12-09: qty 2

## 2021-12-09 MED ORDER — ISOSORBIDE MONONITRATE ER 30 MG PO TB24
30.0000 mg | ORAL_TABLET | Freq: Every day | ORAL | Status: DC
  Administered 2021-12-10 – 2021-12-11 (×2): 30 mg via ORAL
  Filled 2021-12-09 (×2): qty 1

## 2021-12-09 MED ORDER — BUPROPION HCL ER (XL) 150 MG PO TB24
150.0000 mg | ORAL_TABLET | Freq: Every evening | ORAL | Status: DC
  Administered 2021-12-10 (×2): 150 mg via ORAL
  Filled 2021-12-09 (×3): qty 1

## 2021-12-09 MED ORDER — METOPROLOL SUCCINATE ER 25 MG PO TB24
25.0000 mg | ORAL_TABLET | Freq: Every day | ORAL | Status: DC
  Administered 2021-12-10 – 2021-12-11 (×2): 25 mg via ORAL
  Filled 2021-12-09 (×2): qty 1

## 2021-12-09 MED ORDER — SODIUM CHLORIDE 0.9% FLUSH
3.0000 mL | Freq: Two times a day (BID) | INTRAVENOUS | Status: DC
  Administered 2021-12-09 – 2021-12-11 (×4): 3 mL via INTRAVENOUS

## 2021-12-09 MED ORDER — ATORVASTATIN CALCIUM 80 MG PO TABS
80.0000 mg | ORAL_TABLET | Freq: Every day | ORAL | Status: DC
  Administered 2021-12-10 – 2021-12-11 (×2): 80 mg via ORAL
  Filled 2021-12-09 (×2): qty 1

## 2021-12-09 MED ORDER — NITROGLYCERIN 0.4 MG SL SUBL: 0.4000 mg | SUBLINGUAL_TABLET | SUBLINGUAL | Status: DC | PRN

## 2021-12-09 MED ORDER — ONDANSETRON HCL 4 MG/2ML IJ SOLN: 4.0000 mg | Freq: Four times a day (QID) | INTRAMUSCULAR | Status: DC | PRN

## 2021-12-09 NOTE — H&P (Addendum)
?Cardiology Consultation:  ? ?Patient ID: Jeffrey Winters ?MRN: [id number]; DOB: 09/04/1965 ? ?Admit date: 12/09/2021 ?Date of Consult: 12/09/2021 ? ?PCP:  Pcp, No ?  ?Jeffrey Winters ?Cardiologist:  Sherren Mocha, MD  ?Cardiology APP:  Liliane Shi, PA-C  { ? ? ?Patient Profile:  ? ?Jeffrey Winters is a 56 y.o. male with a hx of CAD s/p PCI to OM 2 2017 and to RCA 2020, PAD s/p left SFA PTA 10/2019 and right SFA PTA 11/2019, HTN, hyperlipidemia, tobacco abuse, who is being seen 12/09/2021 for the evaluation of chest pain at the request of Jeffrey Winters, Jeffrey Winters. ? ?History of Present Illness:  ? ?Mr. Jeffrey Winters with above PMH presented here for chest pain and heart palpitation. ? ?Patient follows [name] and Jeffrey Reaper PA-C outpatient, he had MI in 2017 where he was treated with DES to the [place] [facility]. He had inferior STEMI in 07/2019 where he was treated with DES to RCA. He was noted with residual stenosis with 80% in the RPL 2. Myovue from 08/25/19 with EF 61%, + ischemia in the inferior base, EF 55-60%. Large PL branch with severe stenosis in mid-portion of the vessel. He was recommended medical management and PCI in the future if having refractory angina.  ? ?He was last seen by APP Scott in the office on 07/09/21, had stable chest pain and continued ASA '81mg'$ , Plavix '75mg'$ , lipitor '80mg'$ , and metoprolol succinate '25mg'$ , imdur was increased to '30mg'$  daily, and effient was discontinued.  ? ?He was seen by pulmonology 10/08/2020 for dyspnea on exertion, felt symptom was largely anginal equivalent, PFT revealed no obstruction, normal FVC/spirometry. suspicion for asthma and ILD was low.  He was started on trial with an Anoro.  ? ?He is followed by Dr Jeffrey Winters for PAD, was Winters to have bilateral SFA stenoses, underwent left SFA [place] 1 directional atherectomy followed by [facility] and absolute Pro stenting on 10/05/2019 and staged right SFA intravascular lithotripsy followed by [facility] angioplasty on 11/09/19, he had resolved  claudication following procedures.  Dopplers performed 05/21/2020 revealed ABIs are greater than 1 bilaterally with widely patent SFAs. He was last seen by Dr Jeffrey Winters 11/2020 and recommended annual repeat of imaging.  ? ?Patient presented to the ER today complaining chest pain started 3-4 days ago, its located at mid-sternum area, non-radiating, pressure like. It's not triggered by exertion nor relieved by resting. It last 5 minutes and resolves spontaneously initially.  It progressively got worsened over the past few days, where the pain is lasting longer and occurs more frequent now. He workup with chest pain this morning and also felt his heart is racing. He has chronic SOB, seems unchanged and not related with chest pain episodes. He did feel some nausea with diaphoresis today. He denied any dizziness, syncope, rapid weight gain. He noted his arms and hands are numb intermittently while he is sleeping. He also noted his right leg was swollen recently.  He felt chest pain is similar to symptoms when he had heart attack in the past. ? ?Admission diagnostic so far revealed unremarkable BMP and CBC, high sensitive troponin negative 11 x1.  X-ray revealed no acute finding.  Twelve-lead EKG showed sinus bradycardia 55, no acute ischemic change.  Bradycardic with heart rate 40 to 50s, otherwise hemodynamically stable at ED.  Cardiology is consulted regarding chest pain. ? ? ? ? ? ?Past Medical History:  ?Diagnosis Date  ? Coronary artery disease 2017  ? MI s/p PCI to  OM2 // s/p MI 07/2019 >> DES to RCA; residual RPL2 80 // Myoview 08/2019: EF 61, small area of mod ischemia inf base; Low Risk  ? Hypertension   ? MI (myocardial infarction) (Jeffrey Winters)   ? PAD (peripheral artery disease) (Jeffrey Winters)   ? LE [place] 08/2019: R SFA 50-74; L SFA 50-74  ? ? ?Past Surgical History:  ?Procedure Laterality Date  ? ABDOMINAL AORTOGRAM W/LOWER EXTREMITY Bilateral 10/05/2019  ? Procedure: ABDOMINAL AORTOGRAM W/LOWER EXTREMITY;  Surgeon: Lorretta Harp, MD;  Location: [place] [facility];  Service: Cardiovascular;  Laterality: Bilateral;  ? ABDOMINAL AORTOGRAM W/LOWER EXTREMITY N/A 11/09/2019  ? Procedure: ABDOMINAL AORTOGRAM W/LOWER EXTREMITY;  Surgeon: Lorretta Harp, MD;  Location: [place] [facility];  Service: Cardiovascular;  Laterality: N/A;  ? CORONARY/GRAFT ACUTE MI REVASCULARIZATION N/A 07/10/2019  ? Procedure: CORONARY/GRAFT ACUTE MI REVASCULARIZATION;  Surgeon: Nelva Bush, MD;  Location: [place] [facility];  Service: Cardiovascular;  Laterality: N/A;  ? INTRAVASCULAR LITHOTRIPSY  11/09/2019  ? Procedure: INTRAVASCULAR LITHOTRIPSY;  Surgeon: Lorretta Harp, MD;  Location: [place] [facility];  Service: Cardiovascular;;  ? LEFT HEART CATH AND CORONARY ANGIOGRAPHY N/A 07/10/2019  ? Procedure: LEFT HEART CATH AND CORONARY ANGIOGRAPHY;  Surgeon: Nelva Bush, MD;  Location: [place] [facility];  Service: Cardiovascular;  Laterality: N/A;  ? PERCUTANEOUS CORONARY STENT INTERVENTION (PCI-S)  2017  ? DES to OM2 at [facility]  ? PERIPHERAL VASCULAR ATHERECTOMY Left 10/05/2019  ? Procedure: PERIPHERAL VASCULAR ATHERECTOMY;  Surgeon: Lorretta Harp, MD;  Location: [place] [facility];  Service: Cardiovascular;  Laterality: Left;  SFA  ? PERIPHERAL VASCULAR BALLOON ANGIOPLASTY  11/09/2019  ? Procedure: PERIPHERAL VASCULAR BALLOON ANGIOPLASTY;  Surgeon: Lorretta Harp, MD;  Location: [place] [facility];  Service: Cardiovascular;;  ? PERIPHERAL VASCULAR INTERVENTION Left 10/05/2019  ? Procedure: PERIPHERAL VASCULAR INTERVENTION;  Surgeon: Lorretta Harp, MD;  Location: [place] [facility];  Service: Cardiovascular;  Laterality: Left;  SFA  ?  ? ?Home Medications:  ?Prior to Admission medications   ?Medication Sig Start Date End Date Taking? Authorizing Provider  ?albuterol (VENTOLIN HFA) 108 (90 Base) MCG/ACT inhaler INHALE 1-2 PUFFS INTO THE LUNGS EVERY 6 (SIX) HOURS AS NEEDED FOR WHEEZING OR SHORTNESS OF BREATH. 05/14/20 07/09/21  [name] T, PA-C  ?ammonium  lactate (AMLACTIN) 12 % lotion Apply 1 application topically as needed for dry skin. 09/11/21   Trula Slade, DPM  ?aspirin EC 81 MG tablet Take 1 tablet orally daily 03/13/21     ?atorvastatin (LIPITOR) 80 MG tablet Take 1 tablet by mouth daily 09/10/21   [name] T, PA-C  ?buPROPion (WELLBUTRIN XL) 150 MG 24 hr tablet Take 1 tablet by mouth at bedtime ?Patient not taking: Reported on 09/10/2021 03/13/21     ?clopidogrel (PLAVIX) 75 MG tablet Take 1 tablet by mouth daily 09/10/21   [name] T, PA-C  ?diphenhydramine-acetaminophen (TYLENOL PM) 25-500 MG TABS tablet Take 0.5 tablets by mouth at bedtime.    [provider]  ?escitalopram (LEXAPRO) 10 MG tablet Take 10 mg by mouth daily. 04/03/21   [provider]  ?famotidine (PEPCID) 20 MG tablet Take 1 tablet by mouth 2 times daily ?Patient not taking: Reported on 09/10/2021 03/13/21     ?HYDROcodone-acetaminophen (NORCO/VICODIN) 5-325 MG tablet Take 1 tablet by mouth every 6 (six) hours as needed for moderate pain. 12/03/20   [provider]  ?isosorbide mononitrate (IMDUR) 30 MG 24 hr tablet Take 1 tablet (  30 mg total) by mouth daily. 09/10/21   [name] T, PA-C  ?metoprolol succinate (TOPROL-XL) 25 MG 24 hr tablet Take 1 tablet by mouth daily, may take one additional tablet as needed for palpitations 09/10/21   [name] T, PA-C  ?nitroGLYCERIN (NITROSTAT) 0.4 MG SL tablet Take 1 tablet sublingually every 5 minutes as needed for chest pain 03/13/21     ?terbinafine (LAMISIL) 250 MG tablet Take 1 tablet (250 mg total) by mouth daily. 09/11/21   Trula Slade, DPM  ?umeclidinium-vilanterol (ANORO ELLIPTA) 62.5-25 MCG/INH AEPB Inhale 1 puff daily. ?Patient not taking: Reported on 09/10/2021 03/13/21     ? ? ?Inpatient Medications: ?Scheduled Meds: ? ?Continuous Infusions: ? ?PRN Meds: ? ? ?Allergies:   No Known Allergies ? ?Social History:   ?Social History  ? ?Socioeconomic History  ? Marital status: Single  ?  Spouse name: Not on file   ? Number of children: Not on file  ? Years of education: Not on file  ? Highest education level: Not on file  ?Occupational History  ? Not on file  ?Tobacco Use  ? Smoking status: Every Day  ?  Packs/day:

## 2021-12-09 NOTE — ED Provider Notes (Signed)
?  Physical Exam  ?BP 127/79   Pulse (!) 46   Temp 98.3 ?F (36.8 ?C) (Oral)   Resp 16   Ht '5\' 6"'$  (1.676 m)   Wt 113.4 kg   SpO2 97%   BMI 40.35 kg/m?  ? ?Physical Exam ? ?Procedures  ?Procedures ? ?ED Course / MDM  ? ?Clinical Course as of 12/09/21 1612  ?Tue Dec 09, 2021  ?1426 Cardiology to see patient [GL]  ?  ?Clinical Course User Index ?[GL] Loeffler, Adora Fridge, PA-C  ? ?Medical Decision Making ?Risk ?Decision regarding hospitalization. ? ? ?Patient care assumed from [place]. PA at shift change, please see her note for a full HPI. Briefly, patient here with chest pain, asymptomatic since 10am. Prior cardiac history including stents. Plan is for cardiology recommendations, they are at the bedside now seeing patient. Also, will need second troponin. HEART score calculated by prior EDP 5.  ? ?4:11 PM second troponin is 9, according to cardiology's note, patient will be admitted for further evaluation by their service.  Patient hemodynamically stable for admission. ? ? ?Portions of this note were generated with Lobbyist. Dictation errors may occur despite best attempts at proofreading.  ? ? ? ? ? ?  ?Janeece Fitting, PA-C ?12/09/21 1612 ? ?  ?Teressa Lower, MD ?12/09/21 2333 ? ?

## 2021-12-09 NOTE — ED Provider Notes (Signed)
?Jeffrey Winters ?Provider Note ? ? ?CSN: [id number] ?Arrival date & time: 12/09/21  1138 ? ?  ? ?History ?PMH: HTN, HLD, CAD, MI s/p stent x 2, PAD, COPD, Tobacco use ?Chief Complaint  ?Patient presents with  ? Chest Pain  ? Atrial Fibrillation  ? ? ?Jeffrey Winters is a 56 y.o. male. ?Presents the ED with a chief complaint of palpitations and chest pain.  He states that he started noticing the symptoms yesterday morning when he woke up.  He said he did initially start feeling like his heart was racing and this would be followed by midsternal chest pressure and feelings of shortness of breath..  The pain that he would feel felt like the pain that he had when he had an MI.  His symptoms would last for about 5 to 10 minutes and would dissipate take along with the palpitations.  He said that this would come on at any time and are not associated with exertion.  The pain does not radiate anywhere.  He states that the symptoms occurred about 4 times yesterday and have occurred 2 times a day.  They last occurred around 10 AM this morning.  He has not had any symptoms since he has been in the ED.  He is a patient of Dr. Gwenlyn Found and was last seen in December 2022.  He has been compliant with his medications and denies any recent missing of doses. Last Cath was three years ago.  ? ? ?Chest Pain ?Associated symptoms: palpitations and shortness of breath   ?Atrial Fibrillation ?Associated symptoms include chest pain and shortness of breath.  ? ?HPI: A 56 year old patient with a history of peripheral artery disease, hypertension, hypercholesterolemia and obesity presents for evaluation of chest pain. Initial onset of pain was approximately 3-6 hours ago. The patient's chest pain is described as heaviness/pressure/tightness and is not worse with exertion. The patient's chest pain is middle- or left-sided, is not well-localized, is not sharp and does not radiate to the arms/jaw/neck. The patient does  not complain of nausea and denies diaphoresis. The patient has smoked in the past 90 days. The patient has no history of stroke, denies any history of treated diabetes and has no relevant family history of coronary artery disease (first degree relative at less than age 55).  ? ?Home Medications ?Prior to Admission medications   ?Medication Sig Start Date End Date Taking? Authorizing Provider  ?albuterol (VENTOLIN HFA) 108 (90 Base) MCG/ACT inhaler INHALE 1-2 PUFFS INTO THE LUNGS EVERY 6 (SIX) HOURS AS NEEDED FOR WHEEZING OR SHORTNESS OF BREATH. 05/14/20 07/09/21  [name] T, PA-C  ?ammonium lactate (AMLACTIN) 12 % lotion Apply 1 application topically as needed for dry skin. 09/11/21   Trula Slade, DPM  ?aspirin EC 81 MG tablet Take 1 tablet orally daily 03/13/21     ?atorvastatin (LIPITOR) 80 MG tablet Take 1 tablet by mouth daily 09/10/21   [name] T, PA-C  ?buPROPion (WELLBUTRIN XL) 150 MG 24 hr tablet Take 1 tablet by mouth at bedtime ?Patient not taking: Reported on 09/10/2021 03/13/21     ?clopidogrel (PLAVIX) 75 MG tablet Take 1 tablet by mouth daily 09/10/21   [name] T, PA-C  ?diphenhydramine-acetaminophen (TYLENOL PM) 25-500 MG TABS tablet Take 0.5 tablets by mouth at bedtime.    [provider]  ?escitalopram (LEXAPRO) 10 MG tablet Take 10 mg by mouth daily. 04/03/21   [provider]  ?famotidine (PEPCID) 20 MG tablet Take  1 tablet by mouth 2 times daily ?Patient not taking: Reported on 09/10/2021 03/13/21     ?HYDROcodone-acetaminophen (NORCO/VICODIN) 5-325 MG tablet Take 1 tablet by mouth every 6 (six) hours as needed for moderate pain. 12/03/20   [provider]  ?isosorbide mononitrate (IMDUR) 30 MG 24 hr tablet Take 1 tablet (30 mg total) by mouth daily. 09/10/21   [name] T, PA-C  ?metoprolol succinate (TOPROL-XL) 25 MG 24 hr tablet Take 1 tablet by mouth daily, may take one additional tablet as needed for palpitations 09/10/21   [name] T, PA-C   ?nitroGLYCERIN (NITROSTAT) 0.4 MG SL tablet Take 1 tablet sublingually every 5 minutes as needed for chest pain 03/13/21     ?terbinafine (LAMISIL) 250 MG tablet Take 1 tablet (250 mg total) by mouth daily. 09/11/21   Trula Slade, DPM  ?umeclidinium-vilanterol (ANORO ELLIPTA) 62.5-25 MCG/INH AEPB Inhale 1 puff daily. ?Patient not taking: Reported on 09/10/2021 03/13/21     ?   ? ?Allergies    ?Patient has no known allergies.   ? ?Review of Systems   ?Review of Systems  ?Respiratory:  Positive for shortness of breath.   ?Cardiovascular:  Positive for chest pain and palpitations.  ?All other systems reviewed and are negative. ? ?Physical Exam ?Updated Vital Signs ?BP 127/79   Pulse (!) 46   Temp 98.3 ?F (36.8 ?C) (Oral)   Resp 16   Ht '5\' 6"'$  (1.676 m)   Wt 113.4 kg   SpO2 97%   BMI 40.35 kg/m?  ?Physical Exam ?Vitals and nursing note reviewed.  ?Constitutional:   ?   General: He is not in acute distress. ?   Appearance: Normal appearance. He is well-developed. He is not ill-appearing, toxic-appearing or diaphoretic.  ?HENT:  ?   Head: Normocephalic and atraumatic.  ?   Nose: No nasal deformity.  ?   Mouth/Throat:  ?   Lips: Pink. No lesions.  ?   Mouth: No injury, lacerations, oral lesions or angioedema.  ?   Pharynx: Uvula midline. No pharyngeal swelling or uvula swelling.  ?Eyes:  ?   General: Gaze aligned appropriately. No scleral icterus.    ?   Right eye: No discharge.     ?   Left eye: No discharge.  ?   Conjunctiva/sclera: Conjunctivae normal.  ?   Right eye: Right conjunctiva is not injected. No exudate or hemorrhage. ?   Left eye: Left conjunctiva is not injected. No exudate or hemorrhage. ?Neck:  ?   Vascular: No JVD.  ?   Trachea: No tracheal deviation.  ?Cardiovascular:  ?   Rate and Rhythm: Normal rate and regular rhythm.  ?   Pulses: Normal pulses.     ?     Radial pulses are 2+ on the right side and 2+ on the left side.  ?     Dorsalis pedis pulses are 2+ on the right side and 2+ on the left  side.  ?   Heart sounds: Normal heart sounds, S1 normal and S2 normal. Heart sounds not distant. No murmur heard. ?  No friction rub. No gallop. No S3 or S4 sounds.  ?Pulmonary:  ?   Effort: Pulmonary effort is normal. No tachypnea, accessory muscle usage or respiratory distress.  ?   Breath sounds: Normal breath sounds. No stridor. No decreased breath sounds, wheezing, rhonchi or rales.  ?Chest:  ?   Chest wall: No tenderness.  ?Abdominal:  ?   General: Abdomen is flat.  There is no distension.  ?   Palpations: Abdomen is soft. There is no mass or pulsatile mass.  ?   Tenderness: There is no abdominal tenderness. There is no right CVA tenderness, left CVA tenderness, guarding or rebound.  ?   Hernia: No hernia is present.  ?Musculoskeletal:  ?   Right lower leg: No tenderness. No edema.  ?   Left lower leg: No tenderness. No edema.  ?Skin: ?   General: Skin is warm and dry.  ?   Coloration: Skin is not jaundiced or pale.  ?   Findings: No bruising, erythema, lesion or rash.  ?Neurological:  ?   General: No focal deficit present.  ?   Mental Status: He is alert and oriented to person, place, and time.  ?   GCS: GCS eye subscore is 4. GCS verbal subscore is 5. GCS motor subscore is 6.  ?Psychiatric:     ?   Mood and Affect: Mood normal.     ?   Behavior: Behavior normal. Behavior is cooperative.  ? ? ?ED Results / Procedures / Treatments   ?Labs ?(all labs ordered are listed, but only abnormal results are displayed) ?Labs Reviewed  ?BASIC METABOLIC PANEL  ?CBC  ?TROPONIN I (HIGH SENSITIVITY)  ?TROPONIN I (HIGH SENSITIVITY)  ? ? ?EKG ?EKG Interpretation ? ?Date/Time:  Tuesday Dec 09 2021 11:45:52 EDT ?Ventricular Rate:  55 ?PR Interval:  190 ?QRS Duration: 114 ?QT Interval:  400 ?QTC Calculation: 382 ?R Axis:   39 ?Text Interpretation: Sinus bradycardia Possible Left atrial enlargement Nonspecific T wave abnormality Abnormal ECG Confirmed by Godfrey Pick [phone]) on 12/09/2021 2:03:48 PM ? ?Radiology ?DG Chest 1  View ? ?Result Date: 12/09/2021 ?CLINICAL DATA:  Chest pain. EXAM: CHEST  1 VIEW COMPARISON:  Chest x-ray 07/04/2020. FINDINGS: No consolidation. No visible pleural effusions or pneumothorax. Cardiomediastinal silhouette is within normal

## 2021-12-09 NOTE — ED Triage Notes (Signed)
EMS stated, chest pain on and off for 3 days does have A-Fib.  ?18g Rt. AC ?

## 2021-12-10 ENCOUNTER — Other Ambulatory Visit: Payer: Self-pay

## 2021-12-10 ENCOUNTER — Observation Stay (HOSPITAL_BASED_OUTPATIENT_CLINIC_OR_DEPARTMENT_OTHER): Payer: Medicaid Other

## 2021-12-10 ENCOUNTER — Encounter (HOSPITAL_COMMUNITY)
Admission: EM | Disposition: A | Discharge status: Home / Self Care | Payer: Self-pay | Source: Home / Self Care | Attending: Emergency Medicine

## 2021-12-10 DIAGNOSIS — I200 Unstable angina: Secondary | ICD-10-CM | POA: Diagnosis not present

## 2021-12-10 DIAGNOSIS — I252 Old myocardial infarction: Secondary | ICD-10-CM | POA: Diagnosis not present

## 2021-12-10 DIAGNOSIS — I25110 Atherosclerotic heart disease of native coronary artery with unstable angina pectoris: Secondary | ICD-10-CM | POA: Diagnosis not present

## 2021-12-10 DIAGNOSIS — E785 Hyperlipidemia, unspecified: Secondary | ICD-10-CM | POA: Diagnosis not present

## 2021-12-10 DIAGNOSIS — R079 Chest pain, unspecified: Secondary | ICD-10-CM

## 2021-12-10 DIAGNOSIS — I25119 Atherosclerotic heart disease of native coronary artery with unspecified angina pectoris: Secondary | ICD-10-CM

## 2021-12-10 DIAGNOSIS — I739 Peripheral vascular disease, unspecified: Secondary | ICD-10-CM | POA: Diagnosis not present

## 2021-12-10 DIAGNOSIS — I10 Essential (primary) hypertension: Secondary | ICD-10-CM | POA: Diagnosis not present

## 2021-12-10 DIAGNOSIS — J449 Chronic obstructive pulmonary disease, unspecified: Secondary | ICD-10-CM | POA: Diagnosis not present

## 2021-12-10 DIAGNOSIS — Z720 Tobacco use: Secondary | ICD-10-CM | POA: Diagnosis not present

## 2021-12-10 HISTORY — PX: CORONARY PRESSURE/FFR STUDY: CATH118243

## 2021-12-10 HISTORY — PX: INTRAVASCULAR PRESSURE WIRE/FFR STUDY: CATH118243

## 2021-12-10 HISTORY — PX: LEFT HEART CATH AND CORONARY ANGIOGRAPHY: CATH118249

## 2021-12-10 LAB — CBC
HCT: 41.5 % (ref 39.0–52.0)
Hemoglobin: 13.7 g/dL (ref 13.0–17.0)
MCH: 28 pg (ref 26.0–34.0)
MCHC: 33 g/dL (ref 30.0–36.0)
MCV: 84.7 fL (ref 80.0–100.0)
Platelets: 146 10*3/uL — ABNORMAL LOW (ref 150–400)
RBC: 4.9 MIL/uL (ref 4.22–5.81)
RDW: 13.8 % (ref 11.5–15.5)
WBC: 3.7 10*3/uL — ABNORMAL LOW (ref 4.0–10.5)
nRBC: 0 % (ref 0.0–0.2)

## 2021-12-10 LAB — BASIC METABOLIC PANEL
Anion gap: 6 (ref 5–15)
BUN: 16 mg/dL (ref 6–20)
CO2: 27 mmol/L (ref 22–32)
Calcium: 8.6 mg/dL — ABNORMAL LOW (ref 8.9–10.3)
Chloride: 104 mmol/L (ref 98–111)
Creatinine, Ser: 1.01 mg/dL (ref 0.61–1.24)
GFR, Estimated: >60 mL/min (ref >60)
Glucose, Bld: 94 mg/dL (ref 70–99)
Potassium: 4.2 mmol/L (ref 3.5–5.1)
Sodium: 137 mmol/L (ref 135–145)

## 2021-12-10 LAB — ECHOCARDIOGRAM COMPLETE
AR max vel: 2.7 cm2
AV Peak grad: 5.7 mmHg
Ao pk vel: 1.2 m/s
Area-P 1/2: 5.02 cm2
Height: 66 in
S' Lateral: 3.8 cm
Single Plane A4C EF: 37.2 %
Weight: 4000 oz

## 2021-12-10 LAB — LIPID PANEL
Cholesterol: 114 mg/dL (ref 0–200)
HDL: 33 mg/dL — ABNORMAL LOW (ref >40)
LDL Cholesterol: 58 mg/dL (ref 0–99)
Total CHOL/HDL Ratio: 3.5 RATIO
Triglycerides: 115 mg/dL (ref <150)
VLDL: 23 mg/dL (ref 0–40)

## 2021-12-10 LAB — POCT ACTIVATED CLOTTING TIME: Activated Clotting Time: 305 seconds

## 2021-12-10 SURGERY — LEFT HEART CATH AND CORONARY ANGIOGRAPHY
Anesthesia: LOCAL

## 2021-12-10 MED ORDER — SODIUM CHLORIDE 0.9% FLUSH: 3.0000 mL | INTRAVENOUS | Status: DC | PRN

## 2021-12-10 MED ORDER — FENTANYL CITRATE (PF) 100 MCG/2ML IJ SOLN
INTRAMUSCULAR | Status: AC
  Filled 2021-12-10: qty 2

## 2021-12-10 MED ORDER — ASPIRIN 81 MG PO CHEW: 81.0000 mg | CHEWABLE_TABLET | ORAL | Status: DC

## 2021-12-10 MED ORDER — HEPARIN (PORCINE) IN NACL 1000-0.9 UT/500ML-% IV SOLN
INTRAVENOUS | Status: DC | PRN
  Administered 2021-12-10 (×2): 500 mL

## 2021-12-10 MED ORDER — HEPARIN (PORCINE) IN NACL 1000-0.9 UT/500ML-% IV SOLN
INTRAVENOUS | Status: AC
  Filled 2021-12-10: qty 1000

## 2021-12-10 MED ORDER — SODIUM CHLORIDE 0.9 % WEIGHT BASED INFUSION: 1.0000 mL/kg/h | INTRAVENOUS | Status: AC

## 2021-12-10 MED ORDER — SODIUM CHLORIDE 0.9 % WEIGHT BASED INFUSION
1.0000 mL/kg/h | INTRAVENOUS | Status: DC
  Administered 2021-12-10: 1 mL/kg/h via INTRAVENOUS

## 2021-12-10 MED ORDER — LIDOCAINE HCL (PF) 1 % IJ SOLN
INTRAMUSCULAR | Status: AC
  Filled 2021-12-10: qty 30

## 2021-12-10 MED ORDER — MIDAZOLAM HCL 2 MG/2ML IJ SOLN
INTRAMUSCULAR | Status: AC
  Filled 2021-12-10: qty 2

## 2021-12-10 MED ORDER — SODIUM CHLORIDE 0.9 % IV SOLN: 250.0000 mL | INTRAVENOUS | Status: DC | PRN

## 2021-12-10 MED ORDER — HEPARIN SODIUM (PORCINE) 1000 UNIT/ML IJ SOLN
INTRAMUSCULAR | Status: DC | PRN
  Administered 2021-12-10: 5000 [IU] via INTRAVENOUS
  Administered 2021-12-10: 6000 [IU] via INTRAVENOUS

## 2021-12-10 MED ORDER — LABETALOL HCL 5 MG/ML IV SOLN: 10.0000 mg | INTRAVENOUS | Status: AC | PRN

## 2021-12-10 MED ORDER — VERAPAMIL HCL 2.5 MG/ML IV SOLN
INTRAVENOUS | Status: DC | PRN
  Administered 2021-12-10: 10 mL via INTRA_ARTERIAL

## 2021-12-10 MED ORDER — HEPARIN SODIUM (PORCINE) 1000 UNIT/ML IJ SOLN
INTRAMUSCULAR | Status: AC
  Filled 2021-12-10: qty 10

## 2021-12-10 MED ORDER — HYDRALAZINE HCL 20 MG/ML IJ SOLN: 10.0000 mg | INTRAMUSCULAR | Status: AC | PRN

## 2021-12-10 MED ORDER — SODIUM CHLORIDE 0.9 % WEIGHT BASED INFUSION: 3.0000 mL/kg/h | INTRAVENOUS | Status: AC

## 2021-12-10 MED ORDER — SODIUM CHLORIDE 0.9% FLUSH: 3.0000 mL | Freq: Two times a day (BID) | INTRAVENOUS | Status: DC

## 2021-12-10 MED ORDER — VERAPAMIL HCL 2.5 MG/ML IV SOLN
INTRAVENOUS | Status: AC
  Filled 2021-12-10: qty 2

## 2021-12-10 MED ORDER — LIDOCAINE HCL (PF) 1 % IJ SOLN
INTRAMUSCULAR | Status: DC | PRN
  Administered 2021-12-10: 2 mL

## 2021-12-10 MED ORDER — FENTANYL CITRATE (PF) 100 MCG/2ML IJ SOLN
INTRAMUSCULAR | Status: DC | PRN
Start: 2021-12-10 — End: 2021-12-10
  Administered 2021-12-10: 25 ug via INTRAVENOUS

## 2021-12-10 MED ORDER — ISOSORBIDE MONONITRATE ER 60 MG PO TB24
60.0000 mg | ORAL_TABLET | Freq: Every day | ORAL | 3 refills | Status: DC
Start: 2021-12-11 — End: 2022-07-17
  Filled 2021-12-10: qty 90, 90d supply, fill #0

## 2021-12-10 MED ORDER — IOHEXOL 350 MG/ML SOLN
INTRAVENOUS | Status: DC | PRN
Start: 2021-12-10 — End: 2021-12-10
  Administered 2021-12-10: 80 mL

## 2021-12-10 MED ORDER — MIDAZOLAM HCL 2 MG/2ML IJ SOLN
INTRAMUSCULAR | Status: DC | PRN
  Administered 2021-12-10: 2 mg via INTRAVENOUS

## 2021-12-10 SURGICAL SUPPLY — 12 items
CATH 5FR JL3.5 JR4 ANG PIG MP (CATHETERS) ×1 IMPLANT
CATH VISTA GUIDE 6FR XBLAD3.5 (CATHETERS) ×1 IMPLANT
DEVICE RAD COMP TR BAND LRG (VASCULAR PRODUCTS) ×1 IMPLANT
GLIDESHEATH SLEND SS 6F .021 (SHEATH) ×1 IMPLANT
GUIDEWIRE INQWIRE 1.5J.035X260 (WIRE) IMPLANT
GUIDEWIRE PRESSURE X 175 (WIRE) ×1 IMPLANT
INQWIRE 1.5J .035X260CM (WIRE) ×2
KIT ESSENTIALS PG (KITS) ×1 IMPLANT
KIT HEART LEFT (KITS) ×2 IMPLANT
PACK CARDIAC CATHETERIZATION (CUSTOM PROCEDURE TRAY) ×2 IMPLANT
TRANSDUCER W/STOPCOCK (MISCELLANEOUS) ×2 IMPLANT
TUBING CIL FLEX 10 FLL-RA (TUBING) ×2 IMPLANT

## 2021-12-10 NOTE — ED Notes (Signed)
Breakfast orders placed 

## 2021-12-10 NOTE — Interval H&P Note (Signed)
History and Physical Interval Note: ? ?12/10/2021 ?3:14 PM ? ?Jeffrey Winters  has presented today for surgery, with the diagnosis of chest pain.  The various methods of treatment have been discussed with the patient and family. After consideration of risks, benefits and other options for treatment, the patient has consented to  Procedure(s): ?LEFT HEART CATH AND CORONARY ANGIOGRAPHY (N/A) as a surgical intervention.  The patient's history has been reviewed, patient examined, no change in status, stable for surgery.  I have reviewed the patient's chart and labs.  Questions were answered to the patient's satisfaction.   ? ? ?Sherren Mocha ? ? ?

## 2021-12-10 NOTE — Progress Notes (Signed)
Reached out to nurse at end of shift to discuss how post-cath course is going. Per d/w nurse, they have had to deflate TR bland very slowly due to re-bleeding, still has 9 cc's left. In this situation we will keep him overnight for observation and plan for DC in AM if doing well at that time. Nurse will relay this update to patient (they had already advised him at shift change this would likely be the case). ?

## 2021-12-10 NOTE — Progress Notes (Signed)
? ?Progress Note ? ?Patient Name: Jeffrey Winters ?Date of Encounter: 12/10/2021 ? ?[facility] Cardiologist: Sherren Mocha, MD  ? ?Subjective  ? ?No acute events overnight. Planned for cath later today. All questions reviewed. ? ?Inpatient Medications  ?  ?Scheduled Meds: ? aspirin  324 mg Oral NOW  ? aspirin  81 mg Oral Pre-Cath  ? aspirin EC  81 mg Oral Daily  ? atorvastatin  80 mg Oral Daily  ? buPROPion  150 mg Oral QPM  ? clopidogrel  75 mg Oral Daily  ? escitalopram  10 mg Oral Daily  ? heparin  5,000 Units Subcutaneous Q8H  ? isosorbide mononitrate  30 mg Oral Daily  ? metoprolol succinate  25 mg Oral Daily  ? sodium chloride flush  3 mL Intravenous Q12H  ? ?Continuous Infusions: ? sodium chloride    ? sodium chloride    ? ?PRN Meds: ?sodium chloride, acetaminophen, nitroGLYCERIN, ondansetron (ZOFRAN) IV, sodium chloride flush  ? ?Vital Signs  ?  ?Vitals:  ? 12/10/21 0600 12/10/21 0615 12/10/21 0630 12/10/21 0645  ?BP: 127/86 132/82 136/73 133/87  ?Pulse: (!) 53 (!) 52 (!) 51 (!) 51  ?Resp: '15 15 15 15  '$ ?Temp:      ?TempSrc:      ?SpO2: 100% 99% 98% 99%  ?Weight:      ?Height:      ? ? ?Intake/Output Summary (Last 24 hours) at 12/10/2021 0370 ?Last data filed at 12/09/2021 2147 ?Gross per 24 hour  ?Intake 3 ml  ?Output --  ?Net 3 ml  ? ? ?  12/09/2021  ?  2:26 PM 09/10/2021  ? 11:16 AM 07/09/2021  ?  9:47 AM  ?Last 3 Weights  ?Weight (lbs) 250 lb 232 lb 234 lb 9.6 oz  ?Weight (kg) 113.399 kg 105.235 kg 106.414 kg  ?   ? ?Telemetry  ?  ?NSR - Personally Reviewed ? ?ECG  ?  ?12/10/21 sinus bradycardia at 48 bpm, borderline upsloping ST elevation in I - Personally Reviewed ? ?Physical Exam  ? ?GEN: No acute distress.   ?Neck: No JVD ?Cardiac: RRR, no murmurs, rubs, or gallops.  ?Respiratory: Clear to auscultation bilaterally. ?GI: Soft, nontender, non-distended  ?MS: No edema; No deformity. ?Neuro:  Nonfocal  ?Psych: Normal affect  ? ?Labs  ?  ?High Sensitivity Troponin:   ?Recent Labs  ?Lab 12/09/21 ?1149  12/09/21 ?1438 12/09/21 ?1626  ?TROPONINIHS '11 9 9  '$ ?   ?Chemistry ?Recent Labs  ?Lab 12/09/21 ?1149 12/09/21 ?1603 12/10/21 ?0322  ?NA 137  --  137  ?K 3.7  --  4.2  ?CL 104  --  104  ?CO2 26  --  27  ?GLUCOSE 89  --  94  ?BUN 16  --  16  ?CREATININE 1.05 0.97 1.01  ?CALCIUM 8.9  --  8.6*  ?GFRNONAA >60 >60 >60  ?ANIONGAP 7  --  6  ?  ?Lipids  ?Recent Labs  ?Lab 12/10/21 ?0322  ?CHOL 114  ?TRIG 115  ?HDL 33*  ?[place] 58  ?CHOLHDL 3.5  ?  ?Hematology ?Recent Labs  ?Lab 12/09/21 ?1149 12/09/21 ?1603 12/10/21 ?0322  ?WBC 4.2 4.8 3.7*  ?RBC 4.99 5.11 4.90  ?HGB 14.0 14.5 13.7  ?HCT 42.8 43.8 41.5  ?MCV 85.8 85.7 84.7  ?MCH 28.1 28.4 28.0  ?MCHC 32.7 33.1 33.0  ?RDW 13.7 13.8 13.8  ?PLT 164 168 146*  ? ?Thyroid No results for input(s): TSH, FREET4 in the last 168 hours.  ?BNPNo  results for input(s): BNP, PROBNP in the last 168 hours.  ?DDimer No results for input(s): DDIMER in the last 168 hours.  ? ?Radiology  ?  ?DG Chest 1 View ? ?Result Date: 12/09/2021 ?CLINICAL DATA:  Chest pain. EXAM: CHEST  1 VIEW COMPARISON:  Chest x-ray 07/04/2020. FINDINGS: No consolidation. No visible pleural effusions or pneumothorax. Cardiomediastinal silhouette is within normal limits and unchanged. No acute osseous abnormality. IMPRESSION: No evidence of acute cardiopulmonary disease. Electronically Signed   By: Margaretha Sheffield M.D.   On: 12/09/2021 12:26   ? ?Cardiac Studies  ? ?Stress Myoview 08/25/2019: ?  ?Nuclear stress EF: 61%. ?Findings consistent with ischemia. ?This is a low risk study. ?The left ventricular ejection fraction is normal (55-65%). ?  ?Small area of moderate ischemia in the inferior base  ?LV appears enlarged with no RWMAls EF 61% ?  ?Left heart catheterization from 07/10/2019: ?  ?Conclusions: ?Multivessel coronary artery disease with acute occlusion of distal RCA (culprit lesion) as well as 50-60% mid LAD and 80% rPL2 stenoses. ?Patent stent in OM2. ?Moderately to severely elevated LVEDP (30-35 mmHg). ?Successful  primary PCI to the distal RCA using a Resolute Onyx 3.0 x38 mm DES with 0% residual stenosis and TIMI-3 flow. ?  ?Recommendations: ?Admit to 2H-ICU for post-STEMI care. ?Obtain transthoracic echocardiogram; ventriculogram was not performed due to significantly elevated LVEDP. ?Dual antiplatelet therapy with aspirin and prasugrel for at least 12 months. ?Aggressive secondary prevention. ? ?Patient Profile  ?   ?56 y.o. male PMH CAD s/p prior PCI, PAD, COPD, tobacco use, hypertension, hyperlipidemia whom we are asked to see for chest pain. He has a known history of CAD, with most recent recommendation for medical management. However, he notes increasing chest pain/pressure at rest, increasing in frequency.  ? ?Assessment & Plan  ?  ?CAD with history of PCI ?Unstable angina ?PAD ?Hyperlipidemia ?-planned for cath later this afternoon. See consent 5/9 ?-continue aspirin, clopidogrel, atorvastatin, imdur, metoprolol ?-lipids this AM show Tchol 114, HDL 33, LDL 58, TG 115 ?-lp(a) pending ?-hsTn 11, 9, 9 ? ?COPD ?Tobacco use ?-recommend tobacco cessation ? ?Hypertension ?-currently controlled ?-continue medications as above ? ?For questions or updates, please contact [place] ?Please consult [email or web address] for contact info under  ? ?  ?   ?Signed, ?Buford Dresser, MD  ?12/10/2021, 8:21 AM    ?

## 2021-12-10 NOTE — TOC Progression Note (Signed)
Transition of Care (TOC) - Progression Note  ? ? ?Patient Details  ?Name: Jeffrey Winters ?MRN: [id number] ?Date of Birth: 1966/04/13 ? ?Transition of Care (TOC) CM/SW Contact  ?Zenon Mayo, RN ?Phone Number: ?12/10/2021, 3:09 PM ? ?Clinical Narrative:    ? ?Transition of Care (TOC) Screening Note ? ? ?Patient Details  ?Name: Jeffrey Winters ?Date of Birth: [birth year] ? ? ?Transition of Care (TOC) CM/SW Contact:    ?Zenon Mayo, RN ?Phone Number: ?12/10/2021, 3:09 PM ? ? ? ?Transition of Care Department [facility]) has reviewed patient and no TOC needs have been identified at this time. We will continue to monitor patient advancement through interdisciplinary progression rounds. If new patient transition needs arise, please place a TOC consult. ?  ? ? ?  ?  ? ?Expected Discharge Plan and Services ?  ?  ?  ?  ?  ?                ?  ?  ?  ?  ?  ?  ?  ?  ?  ?  ? ? ?Social Determinants of Health (SDOH) Interventions ?  ? ?Readmission Risk Interventions ?   ? View : No data to display.  ?  ?  ?  ? ? ?

## 2021-12-10 NOTE — Significant Event (Signed)
Patient transported back from Cath lab with TR band 10cc got up to bathroom and started bleeding from radial cath site. Cath lab staff inserted 5cc of air back into band until bleeding stopped. Will reassess in 30 mins to remove air. Plan to discharge after. ?

## 2021-12-10 NOTE — Discharge Summary (Signed)
?Discharge Summary  ?  ?Patient ID: Jeffrey Winters ?MRN: [id number]; DOB: 02-Aug-1966 ? ?Admit date: 12/09/2021 ?Discharge date: 12/10/2021 ? ?PCP:  Pcp, No ?  ?[facility] Providers ?Cardiologist:  Sherren Mocha, MD  ?Cardiology APP:  Liliane Shi, PA-C  { ? ? ?Discharge Diagnoses  ?  ?Principal Problem: ?  Angina pectoris ([place]) ?Active Problems: ?  Hyperlipidemia ?  Tobacco abuse ?  Essential hypertension ? ? ? ?Diagnostic Studies/Procedures  ?  ?Echocardiogram 12/10/21  ? 1. Left ventricular ejection fraction, by estimation, is 55%. The left  ?ventricle has normal function. The left ventricle has no regional wall  ?motion abnormalities. Left ventricular diastolic parameters were normal.  ? 2. Right ventricular systolic function is normal. The right ventricular  ?size is normal. There is mildly elevated pulmonary artery systolic  ?pressure. The estimated right ventricular systolic pressure is 29.7 mmHg.  ? 3. Left atrial size was mildly dilated.  ? 4. The mitral valve is abnormal. Trivial mitral valve regurgitation.  ? 5. The aortic valve is tricuspid. Aortic valve regurgitation is not  ?visualized. Aortic valve sclerosis is present, with no evidence of aortic  ?valve stenosis.  ? 6. The inferior vena cava is normal in size with <50% respiratory  ?variability, suggesting right atrial pressure of 8 mmHg.  ? ?Comparison(s): Compared to prior TTE in 07/2019, there is no significant  ?change. Basal inferior hypokinesis not appreciated on current study.  ?_____________ ?  ?History of Present Illness   ?  ?Jeffrey Winters is a 56 y.o. male with a history of CAD s/p PCI to OM 2 2017 and to RCA 2020, PAD s/p left SFA PTA 10/2019 and right SFA PTA 11/2019, HTN, hyperlipidemia, tobacco abuse, who was seen 12/09/2021 for the evaluation of chest pain.  ? ?Patient is followed by [name] and Nicki Reaper PA-C outpatient, he had an MI in 2017 where he was treated with DES to the [place] at [facility]. He had inferior STEMI in 07/2019  where he was treated with DES to RCA. He was noted to have residual stenosis with 80% in the RPL 2. Myovue from 08/25/19 showed EF 61%, + ischemia in the inferior base, EF 55-60%. Large PL branch with severe stenosis in mid-portion of the vessel. He was recommended medical management and PCI in the future if having refractory angina.  ?  ?He was last seen by APP Scott in the office on 07/09/21, had stable chest pain and continued ASA '81mg'$ , Plavix '75mg'$ , lipitor '80mg'$ , and metoprolol succinate '25mg'$ , imdur was increased to '30mg'$  daily, and effient was discontinued.  ?  ?He was seen by pulmonology 10/08/2020 for dyspnea on exertion, felt symptoms were largely anginal equivalent, PFT revealed no obstruction, normal FVC/spirometry. suspicion for asthma and ILD was low.  He was started on trial with an Anoro.  ?  ?He is followed by Dr Gwenlyn Found for PAD, was found to have bilateral SFA stenoses, underwent left SFA [place] 1 directional atherectomy followed by [facility] and absolute Pro stenting on 10/05/2019 and staged right SFA intravascular lithotripsy followed by [facility] angioplasty on 11/09/19, he had resolved claudication following procedures.  Dopplers performed 05/21/2020 revealed ABIs are greater than 1 bilaterally with widely patent SFAs. He was last seen by Dr Gwenlyn Found 11/2020 and recommended annual repeat of imaging.  ?  ?Patient presented to the ER on 5/9 complaining of chest pain that started 3-4 days prior. Pain was located at mid-sternum area, non-radiating, pressure like. It was not triggered by  exertion nor relieved by resting. It lasted 5 minutes and resolved spontaneously initially.  It progressively worsened over the past next days, where the pain lasted longer and occurred more frequent now. He woke up with chest pain the morning of presentation, and felt like his heart was racing. He has chronic SOB, seemed unchanged and unrelated to chest pain episodes. He did feel some nausea with diaphoresis. He denied any dizziness, syncope,  rapid weight gain. He noted his arms and hands were numb intermittently while he was sleeping. He also noted his right leg was swollen recently.  He felt chest pain wass similar to symptoms when he had heart attack in the past. ?  ?Admission diagnostics revealed unremarkable BMP and CBC, high sensitive troponin negative 11 x1.  X-ray revealed no acute finding.  Twelve-lead EKG showed sinus bradycardia 55, no acute ischemic change.  Bradycardic with heart rate 40 to 50s, otherwise hemodynamically stable at ED.  Cardiology was consulted regarding chest pain. ? ?Hospital Course  ?   ?Consultants: None  ? ?Chest pain ?CAD with hx of PCI to [place] 2017 and dis RCA 2020 ?-Presented complaining of 3-4 days of intermittent/progressive chest pain   ?-High sensitive troponin 9>>9  ?-EKG without acute ischemic changes ?-last LHC from 07/2019 showed patent OM2 stent, s/p PCI to dis RCA, and has 50-60% mid LAD and 80% rPL2 stenoses ?- NM stress Myovue from 08/25/19 showed inferior base moderate ischemia with LVEF 55-65% ?- recommended further workup with cardiac cath and Echo  ?- Cardiac Catheterization completed on 12/10/2021-- no intervention, complete report under chart review (discussed with [name], formal report pending)   ?- Echocardiogram with EF 55%, no regional wall motion abnormalities, normal diastolic function ?- continue home meds:  ASA '81mg'$ , Plavix '75mg'$ , Metoprolol XL '25mg'$ , lipitor '80mg'$  ?- Increased imdur to '60mg'$  daily  ?  ?HTN ?- BP controlled on metoprolol and imdur  ?  ?HLD ?- LDL 58, HDL 33, triglycerides 115 this admission  ?- on statin  ?  ?PAD ?- on DAPT and statin, no significant swelling of right leg noted  ?  ? ?Did the patient have an acute coronary syndrome (MI, NSTEMI, STEMI, etc) this admission?:  No                               ?Did the patient have a percutaneous coronary intervention (stent / angioplasty)?:  No.   ? ?   ?Patient was seen and examined by [name] and deemed stable for discharge   ? ?Patient has a follow up appointment with general cardiology on 5/31  ?  ?_____________ ? ?Discharge Vitals ?Blood pressure 129/81, pulse 64, temperature 97.8 ?F (36.6 ?C), temperature source Oral, resp. rate 17, height '5\' 7"'$  (1.702 m), weight 105 kg, SpO2 93 %.  ?Filed Weights  ? 12/09/21 1426 12/10/21 1457  ?Weight: 113.4 kg 105 kg  ? ? ?Labs & Radiologic Studies  ?  ?CBC ?Recent Labs  ?  12/09/21 ?1603 12/10/21 ?0322  ?WBC 4.8 3.7*  ?HGB 14.5 13.7  ?HCT 43.8 41.5  ?MCV 85.7 84.7  ?PLT 168 146*  ? ?Basic Metabolic Panel ?Recent Labs  ?  12/09/21 ?1149 12/09/21 ?1603 12/10/21 ?0322  ?NA 137  --  137  ?K 3.7  --  4.2  ?CL 104  --  104  ?CO2 26  --  27  ?GLUCOSE 89  --  94  ?BUN 16  --  16  ?CREATININE 1.05 0.97 1.01  ?CALCIUM 8.9  --  8.6*  ? ?Liver Function Tests ?No results for input(s): AST, ALT, ALKPHOS, BILITOT, PROT, ALBUMIN in the last 72 hours. ?No results for input(s): LIPASE, AMYLASE in the last 72 hours. ?High Sensitivity Troponin:   ?Recent Labs  ?Lab 12/09/21 ?1149 12/09/21 ?1438 12/09/21 ?1626  ?TROPONINIHS '11 9 9  '$ ?  ?BNP ?Invalid input(s): POCBNP ?D-Dimer ?No results for input(s): DDIMER in the last 72 hours. ?Hemoglobin A1C ?No results for input(s): HGBA1C in the last 72 hours. ?Fasting Lipid Panel ?Recent Labs  ?  12/10/21 ?0322  ?CHOL 114  ?HDL 33*  ?[place]da 58  ?TRIG 115  ?CHOLHDL 3.5  ? ?Thyroid Function Tests ?No results for input(s): TSH, T4TOTAL, T3FREE, THYROIDAB in the last 72 hours. ? ?Invalid input(s): FREET3 ?_____________  ?DG Chest 1 View ? ?Result Date: 12/09/2021 ?CLINICAL DATA:  Chest pain. EXAM: CHEST  1 VIEW COMPARISON:  Chest x-ray 07/04/2020. FINDINGS: No consolidation. No visible pleural effusions or pneumothorax. Cardiomediastinal silhouette is within normal limits and unchanged. No acute osseous abnormality. IMPRESSION: No evidence of acute cardiopulmonary disease. Electronically Signed   By: Margaretha Sheffield M.D.   On: 12/09/2021 12:26  ? ?VAS [place] ABI WITH/WO TBI ? ?Result  Date: 11/25/2021 ? LOWER EXTREMITY DOPPLER STUDY Patient Name:  Jeffrey Winters  Date of Exam:   11/24/2021 Medical Rec #: [id number]       Accession #:    [id number] Date of Birth: February 09, 1966       Patient Gen

## 2021-12-11 ENCOUNTER — Encounter (HOSPITAL_COMMUNITY): Payer: Self-pay | Admitting: Cardiovascular Disease

## 2021-12-11 DIAGNOSIS — I10 Essential (primary) hypertension: Secondary | ICD-10-CM | POA: Diagnosis not present

## 2021-12-11 DIAGNOSIS — I209 Angina pectoris, unspecified: Secondary | ICD-10-CM

## 2021-12-11 DIAGNOSIS — I25110 Atherosclerotic heart disease of native coronary artery with unstable angina pectoris: Secondary | ICD-10-CM | POA: Diagnosis not present

## 2021-12-11 DIAGNOSIS — I252 Old myocardial infarction: Secondary | ICD-10-CM | POA: Diagnosis not present

## 2021-12-11 DIAGNOSIS — J449 Chronic obstructive pulmonary disease, unspecified: Secondary | ICD-10-CM | POA: Diagnosis not present

## 2021-12-11 LAB — CBC
HCT: 41.4 % (ref 39.0–52.0)
Hemoglobin: 13.8 g/dL (ref 13.0–17.0)
MCH: 27.9 pg (ref 26.0–34.0)
MCHC: 33.3 g/dL (ref 30.0–36.0)
MCV: 83.8 fL (ref 80.0–100.0)
Platelets: 140 10*3/uL — ABNORMAL LOW (ref 150–400)
RBC: 4.94 MIL/uL (ref 4.22–5.81)
RDW: 13.8 % (ref 11.5–15.5)
WBC: 4.2 10*3/uL (ref 4.0–10.5)
nRBC: 0 % (ref 0.0–0.2)

## 2021-12-11 LAB — BASIC METABOLIC PANEL
Anion gap: 7 (ref 5–15)
BUN: 15 mg/dL (ref 6–20)
CO2: 22 mmol/L (ref 22–32)
Calcium: 8.5 mg/dL — ABNORMAL LOW (ref 8.9–10.3)
Chloride: 108 mmol/L (ref 98–111)
Creatinine, Ser: 0.91 mg/dL (ref 0.61–1.24)
GFR, Estimated: >60 mL/min (ref >60)
Glucose, Bld: 89 mg/dL (ref 70–99)
Potassium: 4.5 mmol/L (ref 3.5–5.1)
Sodium: 137 mmol/L (ref 135–145)

## 2021-12-11 LAB — LIPOPROTEIN A (LPA): Lipoprotein (a): 203.4 nmol/L — ABNORMAL HIGH (ref <75.0)

## 2021-12-11 NOTE — TOC Transition Note (Signed)
Transition of Care (TOC) - CM/SW Discharge Note ? ? ?Patient Details  ?Name: Jeffrey Winters ?MRN: [id number] ?Date of Birth: [birth year] ? ?Transition of Care (TOC) CM/SW Contact:  ?Zenon Mayo, RN ?Phone Number: ?12/11/2021, 10:12 AM ? ? ?Clinical Narrative:    ?Patient is for dc today,he has no needs. He will call to see if his ride can transport him home now.  ? ? ?  ?  ? ? ?Patient Goals and CMS Choice ?  ?  ?  ? ?Discharge Placement ?  ?           ?  ?  ?  ?  ? ?Discharge Plan and Services ?  ?  ?           ?  ?  ?  ?  ?  ?  ?  ?  ?  ?  ? ?Social Determinants of Health (SDOH) Interventions ?  ? ? ?Readmission Risk Interventions ?   ? View : No data to display.  ?  ?  ?  ? ? ? ? ? ?

## 2021-12-11 NOTE — Progress Notes (Signed)
? ?Progress Note ? ?Patient Name: Jeffrey Winters ?Date of Encounter: 12/11/2021 ? ?[facility] Cardiologist: Sherren Mocha, MD  ? ?Subjective  ? ?Had cath late afternoon yesterday. Initially planned for discharge, but had rebleeding at radial cath site, kept overnight to monitor. Reviewed cath results, recommendations for management. ? ?Inpatient Medications  ?  ?Scheduled Meds: ? aspirin EC  81 mg Oral Daily  ? atorvastatin  80 mg Oral Daily  ? buPROPion  150 mg Oral QPM  ? clopidogrel  75 mg Oral Daily  ? escitalopram  10 mg Oral Daily  ? heparin  5,000 Units Subcutaneous Q8H  ? isosorbide mononitrate  30 mg Oral Daily  ? metoprolol succinate  25 mg Oral Daily  ? sodium chloride flush  3 mL Intravenous Q12H  ? ?Continuous Infusions: ? sodium chloride    ? ?PRN Meds: ?sodium chloride, acetaminophen, nitroGLYCERIN, ondansetron (ZOFRAN) IV  ? ?Vital Signs  ?  ?Vitals:  ? 12/10/21 1900 12/10/21 2049 12/11/21 0431 12/11/21 0808  ?BP: 132/69 122/75 (!) 143/75 128/67  ?Pulse: 61 (!) 58 (!) 55 72  ?Resp: '19 19 19 17  '$ ?Temp:  97.8 ?F (36.6 ?C) 97.9 ?F (36.6 ?C) 98 ?F (36.7 ?C)  ?TempSrc:  Oral Oral   ?SpO2: 95% 95% 95% 95%  ?Weight:   104.1 kg   ?Height:      ? ? ?Intake/Output Summary (Last 24 hours) at 12/11/2021 0955 ?Last data filed at 12/11/2021 2542 ?Gross per 24 hour  ?Intake 657 ml  ?Output --  ?Net 657 ml  ? ? ?  12/11/2021  ?  4:31 AM 12/10/2021  ?  2:57 PM 12/09/2021  ?  2:26 PM  ?Last 3 Weights  ?Weight (lbs) 229 lb 6.4 oz 231 lb 7.7 oz 250 lb  ?Weight (kg) 104.055 kg 105 kg 113.399 kg  ?   ? ?Telemetry  ?  ?NSR - Personally Reviewed ? ?ECG  ?  ?12/01/21 sinus rhythm with sinus arrhythmia - Personally Reviewed ? ?Physical Exam  ? ?GEN: Well nourished, well developed in no acute distress ?NECK: No JVD ?CARDIAC: regular rhythm, normal S1 and S2, no rubs or gallops. No murmur. ?VASCULAR: Radial pulses 2+ bilaterally. Right radial cath site c/d/i ?RESPIRATORY:  Clear to auscultation without rales, wheezing or rhonchi   ?ABDOMEN: Soft, non-tender, non-distended ?MUSCULOSKELETAL:  Moves all 4 limbs independently ?SKIN: Warm and dry, no edema ?NEUROLOGIC:  No focal neuro deficits noted. ?PSYCHIATRIC:  Normal affect   ? ?Labs  ?  ?High Sensitivity Troponin:   ?Recent Labs  ?Lab 12/09/21 ?1149 12/09/21 ?1438 12/09/21 ?1626  ?TROPONINIHS '11 9 9  '$ ?   ?Chemistry ?Recent Labs  ?Lab 12/09/21 ?1149 12/09/21 ?1603 12/10/21 ?0322 12/11/21 ?0413  ?NA 137  --  137 137  ?K 3.7  --  4.2 4.5  ?CL 104  --  104 108  ?CO2 26  --  27 22  ?GLUCOSE 89  --  94 89  ?BUN 16  --  16 15  ?CREATININE 1.05 0.97 1.01 0.91  ?CALCIUM 8.9  --  8.6* 8.5*  ?GFRNONAA >60 >60 >60 >60  ?ANIONGAP 7  --  6 7  ?  ?Lipids  ?Recent Labs  ?Lab 12/10/21 ?0322  ?CHOL 114  ?TRIG 115  ?HDL 33*  ?[place] 58  ?CHOLHDL 3.5  ?  ?Hematology ?Recent Labs  ?Lab 12/09/21 ?1603 12/10/21 ?0322 12/11/21 ?0413  ?WBC 4.8 3.7* 4.2  ?RBC 5.11 4.90 4.94  ?HGB 14.5 13.7 13.8  ?HCT 43.8  41.5 41.4  ?MCV 85.7 84.7 83.8  ?MCH 28.4 28.0 27.9  ?MCHC 33.1 33.0 33.3  ?RDW 13.8 13.8 13.8  ?PLT 168 146* 140*  ? ?Thyroid No results for input(s): TSH, FREET4 in the last 168 hours.  ?BNPNo results for input(s): BNP, PROBNP in the last 168 hours.  ?DDimer No results for input(s): DDIMER in the last 168 hours.  ? ?Radiology  ?  ?DG Chest 1 View ? ?Result Date: 12/09/2021 ?CLINICAL DATA:  Chest pain. EXAM: CHEST  1 VIEW COMPARISON:  Chest x-ray 07/04/2020. FINDINGS: No consolidation. No visible pleural effusions or pneumothorax. Cardiomediastinal silhouette is within normal limits and unchanged. No acute osseous abnormality. IMPRESSION: No evidence of acute cardiopulmonary disease. Electronically Signed   By: Margaretha Sheffield M.D.   On: 12/09/2021 12:26  ? ?CARDIAC CATHETERIZATION ? ?Result Date: 12/10/2021 ?Severe stenosis of the right PLA branch, noted on previous cath studies Moderate nonobstructive stenosis of the mid-LAD with negative pressure analysis (0.96) Patent LCx with no stenosis Patent RCA stents  with no significant restenosis Plan: continue anti-angina Rx, medical treatment. The right PLA branch could be treated with PCI if pt has medically refractory angina.  ? ?ECHOCARDIOGRAM COMPLETE ? ?Result Date: 12/10/2021 ?   ECHOCARDIOGRAM REPORT   Patient Name:   Jeffrey Winters Date of Exam: 12/10/2021 Medical Rec #:  [id number]      Height:       66.0 in Accession #:    [id number]     Weight:       250.0 lb Date of Birth:  1966-02-27      BSA:          2.199 m? Patient Age:    56 years       BP:           151/85 mmHg Patient Gender: M              HR:           52 bpm. Exam Location:  Inpatient Procedure: 2D Echo, Cardiac Doppler and Color Doppler Indications:    Chest pain  History:        Patient has prior history of Echocardiogram examinations, most                 recent 07/11/2019. CAD, COPD; Risk Factors:Hypertension.  Sonographer:    Jefferey Pica Referring Phys: [id number] Margie Billet IMPRESSIONS  1. Left ventricular ejection fraction, by estimation, is 55%. The left ventricle has normal function. The left ventricle has no regional wall motion abnormalities. Left ventricular diastolic parameters were normal.  2. Right ventricular systolic function is normal. The right ventricular size is normal. There is mildly elevated pulmonary artery systolic pressure. The estimated right ventricular systolic pressure is 01.0 mmHg.  3. Left atrial size was mildly dilated.  4. The mitral valve is abnormal. Trivial mitral valve regurgitation.  5. The aortic valve is tricuspid. Aortic valve regurgitation is not visualized. Aortic valve sclerosis is present, with no evidence of aortic valve stenosis.  6. The inferior vena cava is normal in size with <50% respiratory variability, suggesting right atrial pressure of 8 mmHg. Comparison(s): Compared to prior TTE in 07/2019, there is no significant change. Basal inferior hypokinesis not appreciated on current study. FINDINGS  Left Ventricle: Left ventricular ejection fraction, by  estimation, is 55%. The left ventricle has normal function. The left ventricle has no regional wall motion abnormalities. The left ventricular internal cavity size was normal in size. There is no  left ventricular hypertrophy. Left ventricular diastolic parameters were normal. Right Ventricle: The right ventricular size is normal. No increase in right ventricular wall thickness. Right ventricular systolic function is normal. There is mildly elevated pulmonary artery systolic pressure. The tricuspid regurgitant velocity is 2.82  m/s, and with an assumed right atrial pressure of 8 mmHg, the estimated right ventricular systolic pressure is 36.6 mmHg. Left Atrium: Left atrial size was mildly dilated. Right Atrium: Right atrial size was normal in size. Pericardium: There is no evidence of pericardial effusion. Mitral Valve: The mitral valve is abnormal. There is mild thickening of the mitral valve leaflet(s). Trivial mitral valve regurgitation. Tricuspid Valve: The tricuspid valve is normal in structure. Tricuspid valve regurgitation is trivial. Aortic Valve: The aortic valve is tricuspid. Aortic valve regurgitation is not visualized. Aortic valve sclerosis is present, with no evidence of aortic valve stenosis. Aortic valve peak gradient measures 5.7 mmHg. Pulmonic Valve: The pulmonic valve was normal in structure. Pulmonic valve regurgitation is trivial. Aorta: The aortic root and ascending aorta are structurally normal, with no evidence of dilitation. Venous: The inferior vena cava is normal in size with less than 50% respiratory variability, suggesting right atrial pressure of 8 mmHg. IAS/Shunts: The atrial septum is grossly normal.  LEFT VENTRICLE PLAX 2D LVIDd:         5.40 cm      Diastology LVIDs:         3.80 cm      LV e' medial:    8.40 cm/s LV PW:         1.00 cm      LV E/e' medial:  11.9 LV IVS:        1.00 cm      LV e' lateral:   10.70 cm/s LVOT diam:     2.10 cm      LV E/e' lateral: 9.3 LV SV:         78  LV SV Index:   35 LVOT Area:     3.46 cm?  LV Volumes (MOD) LV vol d, MOD A4C: 144.0 ml LV vol s, MOD A4C: 90.4 ml LV SV MOD A4C:     144.0 ml RIGHT VENTRICLE             IVC RV S prime:     11.30 cm/s  IVC d

## 2021-12-11 NOTE — Progress Notes (Signed)
Mobility Specialist Progress Note: ? ? 12/11/21 0900  ?Mobility  ?Activity Ambulated independently in hallway  ?Level of Assistance Standby assist, set-up cues, supervision of patient - no hands on  ?Assistive Device None  ?Distance Ambulated (ft) 470 ft  ?Activity Response Tolerated well  ?$Mobility charge 1 Mobility  ? ?Pt agreeable to mobility session. No physical assistance required. No c/o during ambulation. Pt back in bed with all needs met.  ? ?Nelta Numbers ?Acute Rehab ?Phone: [phone] ?Office Phone: [phone] ? ?

## 2021-12-11 NOTE — Progress Notes (Signed)
Mobility Specialist Progress Note: ? ? 12/11/21 0920  ?Mobility  ?Activity Ambulated independently to bathroom  ?Level of Assistance Independent  ?Assistive Device None  ?Distance Ambulated (ft) 30 ft  ?Activity Response Tolerated well  ?$Mobility charge 1 Mobility  ? ?Pt requesting to go to BR. No physical assistance. Pt back in bed with all needs met.  ? ?Nelta Numbers ?Acute Rehab ?Phone: [phone] ?Office Phone: [phone] ? ?

## 2021-12-11 NOTE — Discharge Summary (Signed)
?Discharge Summary  ?  ?Patient ID: Jeffrey Winters ?MRN: [id number]; DOB: 04-18-1966 ? ?Admit date: 12/09/2021 ?Discharge date: 12/11/2021 ? ?PCP:  Pcp, No ?  ?[facility] Providers ?Cardiologist:  Sherren Mocha, MD  ?Cardiology APP:  Liliane Shi, PA-C     ? ? ?Discharge Diagnoses  ?  ?Principal Problem: ?  Angina pectoris ([place]) ?Active Problems: ?  Hyperlipidemia ?  Tobacco abuse ?  Essential hypertension ?  ?  ?  ?Diagnostic Studies/Procedures  ?  ?Echocardiogram 12/10/21  ? 1. Left ventricular ejection fraction, by estimation, is 55%. The left  ?ventricle has normal function. The left ventricle has no regional wall  ?motion abnormalities. Left ventricular diastolic parameters were normal.  ? 2. Right ventricular systolic function is normal. The right ventricular  ?size is normal. There is mildly elevated pulmonary artery systolic  ?pressure. The estimated right ventricular systolic pressure is 54.6 mmHg.  ? 3. Left atrial size was mildly dilated.  ? 4. The mitral valve is abnormal. Trivial mitral valve regurgitation.  ? 5. The aortic valve is tricuspid. Aortic valve regurgitation is not  ?visualized. Aortic valve sclerosis is present, with no evidence of aortic  ?valve stenosis.  ? 6. The inferior vena cava is normal in size with <50% respiratory  ?variability, suggesting right atrial pressure of 8 mmHg.  ? ?Comparison(s): Compared to prior TTE in 07/2019, there is no significant  ?change. Basal inferior hypokinesis not appreciated on current study.  ?_____________ ?  ?History of Present Illness   ?  ?Jeffrey Winters is a 56 y.o. male with a history of CAD s/p PCI to OM 2 2017 and to RCA 2020, PAD s/p left SFA PTA 10/2019 and right SFA PTA 11/2019, HTN, hyperlipidemia, tobacco abuse, who was seen 12/09/2021 for the evaluation of chest pain.  ?  ?Patient is followed by [name] and Nicki Reaper PA-C outpatient, he had an MI in 2017 where he was treated with DES to the [place] at [facility]. He had inferior STEMI in  07/2019 where he was treated with DES to RCA. He was noted to have residual stenosis with 80% in the RPL 2. Myovue from 08/25/19 showed EF 61%, + ischemia in the inferior base, EF 55-60%. Large PL branch with severe stenosis in mid-portion of the vessel. He was recommended medical management and PCI in the future if having refractory angina.  ?  ?He was last seen by APP Scott in the office on 07/09/21, had stable chest pain and continued ASA '81mg'$ , Plavix '75mg'$ , lipitor '80mg'$ , and metoprolol succinate '25mg'$ , imdur was increased to '30mg'$  daily, and effient was discontinued.  ?  ?He was seen by pulmonology 10/08/2020 for dyspnea on exertion, felt symptoms were largely anginal equivalent, PFT revealed no obstruction, normal FVC/spirometry. suspicion for asthma and ILD was low.  He was started on trial with an Anoro.  ?  ?He is followed by Dr Gwenlyn Found for PAD, was found to have bilateral SFA stenoses, underwent left SFA [place] 1 directional atherectomy followed by [facility] and absolute Pro stenting on 10/05/2019 and staged right SFA intravascular lithotripsy followed by [facility] angioplasty on 11/09/19, he had resolved claudication following procedures.  Dopplers performed 05/21/2020 revealed ABIs are greater than 1 bilaterally with widely patent SFAs. He was last seen by Dr Gwenlyn Found 11/2020 and recommended annual repeat of imaging.  ?  ?Patient presented to the ER on 5/9 complaining of chest pain that started 3-4 days prior. Pain was located at mid-sternum area, non-radiating, pressure  like. It was not triggered by exertion nor relieved by resting. It lasted 5 minutes and resolved spontaneously initially.  It progressively worsened over the past next days, where the pain lasted longer and occurred more frequent now. He woke up with chest pain the morning of presentation, and felt like his heart was racing. He has chronic SOB, seemed unchanged and unrelated to chest pain episodes. He did feel some nausea with diaphoresis. He denied any dizziness,  syncope, rapid weight gain. He noted his arms and hands were numb intermittently while he was sleeping. He also noted his right leg was swollen recently.  He felt chest pain wass similar to symptoms when he had heart attack in the past. ?  ?Admission diagnostics revealed unremarkable BMP and CBC, high sensitive troponin negative 11 x1.  X-ray revealed no acute finding.  Twelve-lead EKG showed sinus bradycardia 55, no acute ischemic change.  Bradycardic with heart rate 40 to 50s, otherwise hemodynamically stable at ED.  Cardiology was consulted regarding chest pain. ?  ?Hospital Course  ?   ?Consultants: None  ?  ?Chest pain ?CAD with hx of PCI to [place] 2017 and dis RCA 2020 ?-Presented complaining of 3-4 days of intermittent/progressive chest pain   ?-High sensitive troponin 9>>9  ?-EKG without acute ischemic changes ?-last LHC from 07/2019 showed patent OM2 stent, s/p PCI to dis RCA, and has 50-60% mid LAD and 80% rPL2 stenoses ?- NM stress Myovue from 08/25/19 showed inferior base moderate ischemia with LVEF 55-65% ?- recommended further workup with cardiac cath and Echo  ?- Cardiac Catheterization completed on 12/10/2021-- no intervention, complete report under chart review (discussed with [name], formal report pending)   ?- Echocardiogram with EF 55%, no regional wall motion abnormalities, normal diastolic function ?- continue home meds:  ASA '81mg'$ , Plavix '75mg'$ , Metoprolol XL '25mg'$ , lipitor '80mg'$  ?- Increased imdur to '60mg'$  daily  ?  ?HTN ?- BP controlled on metoprolol and imdur  ?  ?HLD ?- LDL 58, HDL 33, triglycerides 115 this admission  ?- on statin  ?  ?PAD ?- on DAPT and statin, no significant swelling of right leg noted  ?  ?  ?Did the patient have an acute coronary syndrome (MI, NSTEMI, STEMI, etc) this admission?:  No                               ?Did the patient have a percutaneous coronary intervention (stent / angioplasty)?:  No.   ?  ?   ?Patient was seen and examined by [name] and deemed stable for  discharge  ?  ?Patient has a follow up appointment with general cardiology on 5/31  ?  ?Pt's discharge held until 12/11/21 due to bleeding at radial site.  Today he has been seen and evaluated by Dr. Harrell Gave found stable for discharge.  Rt radial stable.   Also please note his LPa 203.4 very elevated.  We have sent message to Dr. Debara Pickett for possible research study.  Pt was agreeable.   ?  ?Discharge Vitals ?Blood pressure 129/81, pulse 64, temperature 97.8 ?F (36.6 ?C), temperature source Oral, resp. rate 17, height '5\' 7"'$  (1.702 m), weight 105 kg, SpO2 93 %.  ?    ?Filed Weights  ?  12/09/21 1426 12/10/21 1457  ?Weight: 113.4 kg 105 kg  ?  ?  ?Labs & Radiologic Studies  ?  ?CBC ?Recent Labs (last 2 labs)   ?    ?  Recent Labs  ?  12/09/21 ?1603 12/10/21 ?0322  ?WBC 4.8 3.7*  ?HGB 14.5 13.7  ?HCT 43.8 41.5  ?MCV 85.7 84.7  ?PLT 168 146*  ?  ?  ?Basic Metabolic Panel ?Recent Labs (last 2 labs)   ?     ?Recent Labs  ?  12/09/21 ?1149 12/09/21 ?1603 12/10/21 ?0322  ?NA 137  --  137  ?K 3.7  --  4.2  ?CL 104  --  104  ?CO2 26  --  27  ?GLUCOSE 89  --  94  ?BUN 16  --  16  ?CREATININE 1.05 0.97 1.01  ?CALCIUM 8.9  --  8.6*  ?  ?  ?Liver Function Tests ?Recent Labs (last 2 labs)   ?No results for input(s): AST, ALT, ALKPHOS, BILITOT, PROT, ALBUMIN in the last 72 hours.  ? ?Recent Labs (last 2 labs)   ?No results for input(s): LIPASE, AMYLASE in the last 72 hours.  ? ?High Sensitivity Troponin:   ?Last Labs   ?     ?Recent Labs  ?Lab 12/09/21 ?1149 12/09/21 ?1438 12/09/21 ?1626  ?TROPONINIHS '11 9 9  '$ ?  ?  ?BNP ?Recent Labs (last 2 labs)   ?Invalid input(s): POCBNP  ? ?D-Dimer ?Recent Labs (last 2 labs)   ?No results for input(s): DDIMER in the last 72 hours.  ? ?Hemoglobin A1C ?Recent Labs (last 2 labs)   ?No results for input(s): HGBA1C in the last 72 hours.  ? ?Fasting Lipid Panel ?Recent Labs (last 2 labs)   ?   ?Recent Labs  ?  12/10/21 ?0322  ?CHOL 114  ?HDL 33*  ?Show Low 58  ?TRIG 115  ?CHOLHDL 3.5  ?  ?  ?Thyroid  Function Tests ? ?Recent Labs (last 2 labs)   ?No results for input(s): TSH, T4TOTAL, T3FREE, THYROIDAB in the last 72 hours. ?  ?Invalid input(s): FREET3  ? ?_____________  ? ?Imaging Results  ?DG Chest 1 Vie

## 2021-12-11 NOTE — Discharge Instructions (Signed)
Call [facility] at [phone] if any bleeding, swelling or drainage at cath site.  May shower, no tub baths for 48 hours for groin sticks. No lifting over 5 pounds for 3 days.  No Driving for 3 days ? ? ?Heart Healthy Diet ? ?Your small cholesterol elevated.  We have sent your name to our research group to see if you qualify for any trials for this.  You may receive a phone call from them.  ?

## 2021-12-15 ENCOUNTER — Other Ambulatory Visit: Payer: Self-pay | Admitting: Podiatry

## 2021-12-26 ENCOUNTER — Other Ambulatory Visit: Payer: Self-pay

## 2021-12-26 ENCOUNTER — Ambulatory Visit (INDEPENDENT_AMBULATORY_CARE_PROVIDER_SITE_OTHER): Payer: Medicaid Other | Admitting: Nurse Practitioner

## 2021-12-26 ENCOUNTER — Encounter: Payer: Self-pay | Admitting: Nurse Practitioner

## 2021-12-26 VITALS — BP 130/71 | HR 68 | Temp 97.9°F | Ht 67.0 in | Wt 230.1 lb

## 2021-12-26 DIAGNOSIS — M79601 Pain in right arm: Secondary | ICD-10-CM | POA: Diagnosis not present

## 2021-12-26 DIAGNOSIS — Z0000 Encounter for general adult medical examination without abnormal findings: Secondary | ICD-10-CM | POA: Diagnosis not present

## 2021-12-26 LAB — GLUCOSE, POCT (MANUAL RESULT ENTRY): POC Glucose: 110 mg/dl — AB (ref 70–99)

## 2021-12-26 LAB — POCT GLYCOSYLATED HEMOGLOBIN (HGB A1C)
HbA1c POC (<> result, manual entry): 6.1 % (ref 4.0–5.6)
HbA1c, POC (controlled diabetic range): 6.1 % (ref 0.0–7.0)
HbA1c, POC (prediabetic range): 6.1 % (ref 5.7–6.4)
Hemoglobin A1C: 6.1 % — AB (ref 4.0–5.6)

## 2021-12-26 MED ORDER — UMECLIDINIUM-VILANTEROL 62.5-25 MCG/ACT IN AEPB
1.0000 | INHALATION_SPRAY | Freq: Every day | RESPIRATORY_TRACT | 2 refills | Status: DC
  Filled 2021-12-26: qty 60, 30d supply, fill #0

## 2021-12-26 MED ORDER — PREDNISONE 20 MG PO TABS: 20.0000 mg | ORAL_TABLET | Freq: Every day | ORAL | 0 refills | Status: AC

## 2021-12-26 MED ORDER — ALBUTEROL SULFATE HFA 108 (90 BASE) MCG/ACT IN AERS
1.0000 | INHALATION_SPRAY | Freq: Four times a day (QID) | RESPIRATORY_TRACT | 2 refills | Status: DC | PRN
  Filled 2021-12-26: qty 8.5, 25d supply, fill #0

## 2021-12-26 NOTE — Patient Instructions (Addendum)
1. Health care maintenance  - POCT glycosylated hemoglobin (Hb A1C) - POCT glucose (manual entry)  2. Right arm pain  - predniSONE (DELTASONE) 20 MG tablet; Take 1 tablet (20 mg total) by mouth daily with breakfast for 5 days.  Dispense: 5 tablet; Refill: 0  3. Status post heart cath  - continue current medications   - stay active  - keep follow up with cardiology  Follow up:  Follow up in 6 months or sooner

## 2021-12-26 NOTE — Assessment & Plan Note (Signed)
-   POCT glycosylated hemoglobin (Hb A1C) - POCT glucose (manual entry)  2. Right arm pain  - predniSONE (DELTASONE) 20 MG tablet; Take 1 tablet (20 mg total) by mouth daily with breakfast for 5 days.  Dispense: 5 tablet; Refill: 0  3. Status post heart cath  - continue current medications   - stay active  - keep follow up with cardiology  Follow up:  Follow up in 6 months or sooner

## 2021-12-26 NOTE — Progress Notes (Signed)
$'@Patient'g$  ID: Jeffrey Winters, male    DOB: 16-Mar-1966, 56 y.o.   MRN: [id number]  Chief Complaint  Patient presents with   Establish Care    Pt is here to establish care. Pt stated he has pain in his rt shoulder down to his arm.    Referring provider: No ref. provider found    Recent Significant events:  Hospital admission: 12/09/21  Chest pain CAD with hx of PCI to OM2 2017 and dis RCA 2020 -Presented complaining of 3-4 days of intermittent/progressive chest pain   -High sensitive troponin 9>>9  -EKG without acute ischemic changes -last LHC from 07/2019 showed patent OM2 stent, s/p PCI to dis RCA, and has 50-60% mid LAD and 80% rPL2 stenoses - NM stress Myovue from 08/25/19 showed inferior base moderate ischemia with LVEF 55-65% - recommended further workup with cardiac cath and Echo  - Cardiac Catheterization completed on 12/10/2021-- no intervention, complete report under chart review (discussed with [name], formal report pending)   - Echocardiogram with EF 55%, no regional wall motion abnormalities, normal diastolic function - continue home meds:  ASA '81mg'$ , Plavix '75mg'$ , Metoprolol XL '25mg'$ , lipitor '80mg'$  - Increased imdur to '60mg'$  daily    HTN - BP controlled on metoprolol and imdur    HLD - LDL 58, HDL 33, triglycerides 115 this admission  - on statin    PAD - on DAPT and statin, no significant swelling of right leg noted    HPI  Jeffrey Winters is a 56 y.o. male with a history of CAD s/p PCI to OM 2 2017 and to RCA 2020, PAD s/p left SFA PTA 10/2019 and right SFA PTA 11/2019, HTN, hyperlipidemia, tobacco abuse  Patient presents today for hospital follow up and to establish care. Please see notes above for recent hospital course. Patient has been doing well since hospital discharge. He is scheduled to follow up with cardiology next week. He was advised to continue home meds:  ASA '81mg'$ , Plavix '75mg'$ , Metoprolol XL '25mg'$ , lipitor 80 mg and increased imdur to '60mg'$  daily. He states  that he did get his medications and are taking as prescribed.   Patient does complain today of right shoulder pain / right neck pain / feels like electrical shock with hand numbness - this is the same arm used for heart cath insertion.  Denies f/c/s, n/v/d, hemoptysis, PND, chest pain or edema.   No Known Allergies   There is no immunization history on file for this patient.  Past Medical History:  Diagnosis Date   Coronary artery disease 2017   MI s/p PCI to OM2 // s/p MI 07/2019 >> DES to RCA; residual RPL2 80 // Myoview 08/2019: EF 61, small area of mod ischemia inf base; Low Risk   Hypertension    MI (myocardial infarction) ([place])    PAD (peripheral artery disease) ([place])    LE Art [place] 08/2019: R SFA 50-74; L SFA 50-74    Tobacco History: Social History   Tobacco Use  Smoking Status Every Day   Packs/day: 1.00   Years: 7.00   Pack years: 7.00   Types: Cigarettes  Smokeless Tobacco Never  Tobacco Comments   smokes 1/2 pack per day 06/19/2020   Ready to quit: Not Answered Counseling given: Not Answered Tobacco comments: smokes 1/2 pack per day 06/19/2020   Outpatient Encounter Medications as of 12/26/2021  Medication Sig   ammonium lactate (AMLACTIN) 12 % lotion Apply 1 application topically as needed for dry skin.  aspirin EC 81 MG tablet Take 1 tablet orally daily   atorvastatin (LIPITOR) 80 MG tablet Take 1 tablet by mouth daily   buPROPion (WELLBUTRIN XL) 150 MG 24 hr tablet Take 1 tablet by mouth at bedtime   clopidogrel (PLAVIX) 75 MG tablet Take 1 tablet by mouth daily   escitalopram (LEXAPRO) 10 MG tablet Take 10 mg by mouth daily.   famotidine (PEPCID) 20 MG tablet Take 1 tablet by mouth 2 times daily (Patient taking differently: daily.)   HYDROcodone-acetaminophen (NORCO/VICODIN) 5-325 MG tablet Take 1 tablet by mouth every 6 (six) hours as needed for moderate pain.   isosorbide mononitrate (IMDUR) 60 MG 24 hr tablet Take 1 tablet (60 mg total) by mouth daily.    metoprolol succinate (TOPROL-XL) 25 MG 24 hr tablet Take 1 tablet by mouth daily, may take one additional tablet as needed for palpitations   nitroGLYCERIN (NITROSTAT) 0.4 MG SL tablet Take 1 tablet sublingually every 5 minutes as needed for chest pain   predniSONE (DELTASONE) 20 MG tablet Take 1 tablet (20 mg total) by mouth daily with breakfast for 5 days.   umeclidinium-vilanterol (ANORO ELLIPTA) 62.5-25 MCG/ACT AEPB Inhale 1 puff into the lungs daily at 6 (six) AM.   albuterol (VENTOLIN HFA) 108 (90 Base) MCG/ACT inhaler INHALE 1-2 PUFFS INTO THE LUNGS EVERY 6 (SIX) HOURS AS NEEDED FOR WHEEZING OR SHORTNESS OF BREATH.   umeclidinium-vilanterol (ANORO ELLIPTA) 62.5-25 MCG/INH AEPB Inhale 1 puff daily. (Patient not taking: Reported on 12/26/2021)   [DISCONTINUED] albuterol (VENTOLIN HFA) 108 (90 Base) MCG/ACT inhaler INHALE 1-2 PUFFS INTO THE LUNGS EVERY 6 (SIX) HOURS AS NEEDED FOR WHEEZING OR SHORTNESS OF BREATH.   No facility-administered encounter medications on file as of 12/26/2021.     Review of Systems  Review of Systems  Constitutional: Negative.   HENT: Negative.    Cardiovascular: Negative.   Gastrointestinal: Negative.   Allergic/Immunologic: Negative.   Neurological:  Positive for numbness (right hand and arm with associated tingling and pain).  Psychiatric/Behavioral: Negative.        Physical Exam  BP 130/71 (BP Location: Left Arm, Patient Position: Sitting, Cuff Size: Large)   Pulse 68   Temp 97.9 F (36.6 C)   Ht '5\' 7"'$  (1.702 m)   Wt 230 lb 2 oz (104.4 kg)   SpO2 100%   BMI 36.04 kg/m   Wt Readings from Last 5 Encounters:  12/26/21 230 lb 2 oz (104.4 kg)  12/11/21 229 lb 6.4 oz (104.1 kg)  09/10/21 232 lb (105.2 kg)  07/09/21 234 lb 9.6 oz (106.4 kg)  11/24/20 232 lb 2.3 oz (105.3 kg)     Physical Exam Vitals and nursing note reviewed.  Constitutional:      General: He is not in acute distress.    Appearance: He is well-developed.  Cardiovascular:      Rate and Rhythm: Normal rate and regular rhythm.  Pulmonary:     Effort: Pulmonary effort is normal.     Breath sounds: Normal breath sounds.  Skin:    General: Skin is warm and dry.  Neurological:     Mental Status: He is alert and oriented to person, place, and time.     Lab Results:  CBC    Component Value Date/Time   WBC 4.2 12/11/2021 0413   RBC 4.94 12/11/2021 0413   HGB 13.8 12/11/2021 0413   HGB 14.2 11/07/2019 1432   HCT 41.4 12/11/2021 0413   HCT 43.1 11/07/2019 1432   PLT  140 (L) 12/11/2021 0413   PLT 178 11/07/2019 1432   MCV 83.8 12/11/2021 0413   MCV 83 11/07/2019 1432   MCH 27.9 12/11/2021 0413   MCHC 33.3 12/11/2021 0413   RDW 13.8 12/11/2021 0413   RDW 13.9 11/07/2019 1432   LYMPHSABS 2,146 11/12/2021 0933   MONOABS 0.7 11/26/2020 0401   EOSABS 49 11/12/2021 0933   BASOSABS 29 11/12/2021 0933    BMET    Component Value Date/Time   NA 137 12/11/2021 0413   NA 138 09/10/2021 1044   K 4.5 12/11/2021 0413   CL 108 12/11/2021 0413   CO2 22 12/11/2021 0413   GLUCOSE 89 12/11/2021 0413   BUN 15 12/11/2021 0413   BUN 14 09/10/2021 1044   CREATININE 0.91 12/11/2021 0413   CREATININE 0.99 10/31/2021 0930   CALCIUM 8.5 (L) 12/11/2021 0413   GFRNONAA >60 12/11/2021 0413   GFRAA 94 11/07/2019 1432    BNP No results found for: BNP  ProBNP    Component Value Date/Time   PROBNP 233 (H) 07/21/2019 1120    Imaging: DG Chest 1 View  Result Date: 12/09/2021 CLINICAL DATA:  Chest pain. EXAM: CHEST  1 VIEW COMPARISON:  Chest x-ray 07/04/2020. FINDINGS: No consolidation. No visible pleural effusions or pneumothorax. Cardiomediastinal silhouette is within normal limits and unchanged. No acute osseous abnormality. IMPRESSION: No evidence of acute cardiopulmonary disease. Electronically Signed   By: Margaretha Sheffield M.D.   On: 12/09/2021 12:26   CARDIAC CATHETERIZATION  Result Date: 12/10/2021 Severe stenosis of the right PLA branch, noted on previous  cath studies Moderate nonobstructive stenosis of the mid-LAD with negative pressure analysis (0.96) Patent LCx with no stenosis Patent RCA stents with no significant restenosis Plan: continue anti-angina Rx, medical treatment. The right PLA branch could be treated with PCI if pt has medically refractory angina.   ECHOCARDIOGRAM COMPLETE  Result Date: 12/10/2021    ECHOCARDIOGRAM REPORT   Patient Name:   CALLIE BUNYARD Carvin Date of Exam: 12/10/2021 Medical Rec #:  [id number]      Height:       66.0 in Accession #:    [id number]     Weight:       250.0 lb Date of Birth:  1965/08/26      BSA:          2.199 m Patient Age:    22 years       BP:           151/85 mmHg Patient Gender: M              HR:           52 bpm. Exam Location:  Inpatient Procedure: 2D Echo, Cardiac Doppler and Color Doppler Indications:    Chest pain  History:        Patient has prior history of Echocardiogram examinations, most                 recent 07/11/2019. CAD, COPD; Risk Factors:Hypertension.  Sonographer:    Jefferey Pica Referring Phys: [id number] Margie Billet IMPRESSIONS  1. Left ventricular ejection fraction, by estimation, is 55%. The left ventricle has normal function. The left ventricle has no regional wall motion abnormalities. Left ventricular diastolic parameters were normal.  2. Right ventricular systolic function is normal. The right ventricular size is normal. There is mildly elevated pulmonary artery systolic pressure. The estimated right ventricular systolic pressure is 71.6 mmHg.  3. Left atrial size was mildly dilated.  4.  The mitral valve is abnormal. Trivial mitral valve regurgitation.  5. The aortic valve is tricuspid. Aortic valve regurgitation is not visualized. Aortic valve sclerosis is present, with no evidence of aortic valve stenosis.  6. The inferior vena cava is normal in size with <50% respiratory variability, suggesting right atrial pressure of 8 mmHg. Comparison(s): Compared to prior TTE in 07/2019, there is no  significant change. Basal inferior hypokinesis not appreciated on current study. FINDINGS  Left Ventricle: Left ventricular ejection fraction, by estimation, is 55%. The left ventricle has normal function. The left ventricle has no regional wall motion abnormalities. The left ventricular internal cavity size was normal in size. There is no left ventricular hypertrophy. Left ventricular diastolic parameters were normal. Right Ventricle: The right ventricular size is normal. No increase in right ventricular wall thickness. Right ventricular systolic function is normal. There is mildly elevated pulmonary artery systolic pressure. The tricuspid regurgitant velocity is 2.82  m/s, and with an assumed right atrial pressure of 8 mmHg, the estimated right ventricular systolic pressure is 25.8 mmHg. Left Atrium: Left atrial size was mildly dilated. Right Atrium: Right atrial size was normal in size. Pericardium: There is no evidence of pericardial effusion. Mitral Valve: The mitral valve is abnormal. There is mild thickening of the mitral valve leaflet(s). Trivial mitral valve regurgitation. Tricuspid Valve: The tricuspid valve is normal in structure. Tricuspid valve regurgitation is trivial. Aortic Valve: The aortic valve is tricuspid. Aortic valve regurgitation is not visualized. Aortic valve sclerosis is present, with no evidence of aortic valve stenosis. Aortic valve peak gradient measures 5.7 mmHg. Pulmonic Valve: The pulmonic valve was normal in structure. Pulmonic valve regurgitation is trivial. Aorta: The aortic root and ascending aorta are structurally normal, with no evidence of dilitation. Venous: The inferior vena cava is normal in size with less than 50% respiratory variability, suggesting right atrial pressure of 8 mmHg. IAS/Shunts: The atrial septum is grossly normal.  LEFT VENTRICLE PLAX 2D LVIDd:         5.40 cm      Diastology LVIDs:         3.80 cm      LV e' medial:    8.40 cm/s LV PW:         1.00 cm       LV E/e' medial:  11.9 LV IVS:        1.00 cm      LV e' lateral:   10.70 cm/s LVOT diam:     2.10 cm      LV E/e' lateral: 9.3 LV SV:         78 LV SV Index:   35 LVOT Area:     3.46 cm  LV Volumes (MOD) LV vol d, MOD A4C: 144.0 ml LV vol s, MOD A4C: 90.4 ml LV SV MOD A4C:     144.0 ml RIGHT VENTRICLE             IVC RV S prime:     11.30 cm/s  IVC diam: 1.70 cm TAPSE (M-mode): 2.7 cm LEFT ATRIUM             Index        RIGHT ATRIUM           Index LA diam:        3.90 cm 1.77 cm/m   RA Area:     18.60 cm LA Vol (A2C):   86.0 ml 39.11 ml/m  RA Volume:   50.40 ml  22.92 ml/m LA Vol (A4C):   76.4 ml 34.75 ml/m LA Biplane Vol: 85.7 ml 38.98 ml/m  AORTIC VALVE                 PULMONIC VALVE AV Area (Vmax): 2.70 cm     PV Vmax:       0.64 m/s AV Vmax:        119.50 cm/s  PV Peak grad:  1.7 mmHg AV Peak Grad:   5.7 mmHg LVOT Vmax:      93.30 cm/s LVOT Vmean:     63.100 cm/s LVOT VTI:       0.225 m  AORTA Ao Root diam: 2.90 cm Ao Asc diam:  3.40 cm MITRAL VALVE               TRICUSPID VALVE MV Area (PHT): 5.02 cm    TR Peak grad:   31.8 mmHg MV Decel Time: 151 msec    TR Vmax:        282.00 cm/s MV E velocity: 99.80 cm/s MV A velocity: 58.30 cm/s  SHUNTS MV E/A ratio:  1.71        Systemic VTI:  0.22 m                            Systemic Diam: 2.10 cm Gwyndolyn Kaufman MD Electronically signed by Gwyndolyn Kaufman MD Signature Date/Time: 12/10/2021/1:07:54 PM    Final      Assessment & Plan:   Health care maintenance - POCT glycosylated hemoglobin (Hb A1C) - POCT glucose (manual entry)  2. Right arm pain  - predniSONE (DELTASONE) 20 MG tablet; Take 1 tablet (20 mg total) by mouth daily with breakfast for 5 days.  Dispense: 5 tablet; Refill: 0  3. Status post heart cath  - continue current medications   - stay active  - keep follow up with cardiology  Follow up:  Follow up in 6 months or sooner      Fenton Foy, NP 12/26/2021

## 2021-12-30 NOTE — Progress Notes (Unsigned)
Cardiology Office Note:    Date:  12/31/2021   ID:  Jeffrey Winters, DOB 1966/05/30, MRN [id number]  PCP:  Fenton Foy, NP  [facility] Providers Cardiologist:  Sherren Mocha, MD Cardiology APP:  Sharmon Revere  PV Cardiologist:  Quay Burow, MD    Referring MD: [facility] [place]   Chief Complaint:  Hospitalization Follow-up (Admitted with chest pain s/p cardiac cath)    Patient Profile: Coronary artery disease S/p NSTEMI in 02/2016 tx with Xience DES to OM1 s/p NSTEMI in 05/2016 S/p Inf STEMI 07/2019 >> s/p DES to RCA; OM1 stent patent Residual RPL2 80% >> Med Rx Myoview 08/2019: small inf ischemia; low risk >> med Rx continued Cath 12/2021: Patent stent in the RCA, OM, moderate disease in the LAD negative by RFR, R PLA 80-med Rx Peripheral Arterial Disease   S/p L SFA PTA 10/2019 S/p R SFA PTA 11/2019 ABIs 11/2021: R 1.03; L 0.80 >> f/u LE arterial US pending  Hypertension  Hyperlipidemia  Tobacco use COPD  Prior CV Studies: LEFT HEART CATH AND CORONARY ANGIOGRAPHY 12/10/2021 LM normal  LAD mid 15, dist 40 LCx irregs; OM2 stent patent  RCA mid stent patent; RPL2 80  Severe stenosis of the right PLA branch, noted on previous cath studies Moderate nonobstructive stenosis of the mid-LAD with negative pressure analysis (0.96) Patent LCx with no stenosis Patent RCA stents with no significant restenosis    Plan: continue anti-angina Rx, medical treatment. The right PLA branch could be treated with PCI if pt has medically refractory angina.   ECHO COMPLETE WO IMAGING ENHANCING AGENT 12/10/2021 EF 55, no RWMA, normal RVSF, RVSP 39.8, mildly elevated PASP, mild LAE, trivial MR   Myoview 08/25/2019 EF 61, small area of inferior ischemia; low risk   Echocardiogram 07/11/2019 EF 55, mild LVH, inf HK, Gr 2 DD, normal RVSF, trace MR, trivial TR, RVSP 36.3   Cardiac catheterization 07/10/2019 LAD mid 55, dist 40 LCx irregs; OM2 stent patent RCA mid 100,  dist 50; RPL2 80 PCI: 3 x 38 mm Resolute Onyx DES to mid RCA   Cardiac catheterization 02/26/16 ([place]) 2. 2.5 x 18 mm Xience drug eluting stent to the OM2    History of Present Illness:   Jeffrey Winters is a 56 y.o. male with the above problem list.  He was admitted 5/9-5/11 with chest pain.  His hsTrops were neg.  An echocardiogram showed normal EF.  A f/u cardiac catheterization demonstrated patent stents in the OM and RCA.  There was mod non-obs disease in the LAD (neg RFR) and severe stenosis in a RPLA branch that was previously noted.  Med Rx was recommended. PCI could be considered for refractory angina.  He has some radial site bleeding and was kept overnight.  Of note, he had f/u ABIs by podiatry in April 2023 due to a lesion on his L foot.  His L side had decreased and a f/u arterial US is to be scheduled.  He returns for f/u.    He is here alone.  He has had right arm discomfort starting 2 days after discharge.  He notes pain radiating down from his right trapezius area to his ring and small finger area.  His PCP placed him on prednisone for 5 days.  He had marginal improvement with this.  He is concerned it is related to the cath.  He has not had further chest discomfort.  He has not had significant shortness of breath.  He has not had syncope, orthopnea, leg edema.    Past Medical History:  Diagnosis Date   Coronary artery disease 2017   MI s/p PCI to OM2 // s/p MI 07/2019 >> DES to RCA; residual RPL2 80 // Myoview 08/2019: EF 61, small area of mod ischemia inf base; Low Risk   Hypertension    MI (myocardial infarction) ([place])    PAD (peripheral artery disease) ([place])    LE Art [place] 08/2019: R SFA 50-74; L SFA 50-74   Current Medications: Current Meds  Medication Sig   albuterol (VENTOLIN HFA) 108 (90 Base) MCG/ACT inhaler INHALE 1-2 PUFFS INTO THE LUNGS EVERY 6 (SIX) HOURS AS NEEDED FOR WHEEZING OR SHORTNESS OF BREATH.   ammonium lactate (AMLACTIN) 12 % lotion Apply 1  application topically as needed for dry skin.   aspirin EC 81 MG tablet Take 1 tablet orally daily   atorvastatin (LIPITOR) 80 MG tablet Take 1 tablet by mouth daily   buPROPion (WELLBUTRIN XL) 150 MG 24 hr tablet Take 1 tablet by mouth at bedtime   clopidogrel (PLAVIX) 75 MG tablet Take 1 tablet by mouth daily   escitalopram (LEXAPRO) 10 MG tablet Take 10 mg by mouth daily.   famotidine (PEPCID) 20 MG tablet Take 1 tablet by mouth 2 times daily   HYDROcodone-acetaminophen (NORCO/VICODIN) 5-325 MG tablet Take 1 tablet by mouth every 6 (six) hours as needed for moderate pain.   isosorbide mononitrate (IMDUR) 60 MG 24 hr tablet Take 1 tablet (60 mg total) by mouth daily.   metoprolol succinate (TOPROL-XL) 25 MG 24 hr tablet Take 1 tablet by mouth daily, may take one additional tablet as needed for palpitations   nitroGLYCERIN (NITROSTAT) 0.4 MG SL tablet Take 1 tablet sublingually every 5 minutes as needed for chest pain   predniSONE (DELTASONE) 20 MG tablet Take 1 tablet (20 mg total) by mouth daily with breakfast for 5 days.   umeclidinium-vilanterol (ANORO ELLIPTA) 62.5-25 MCG/ACT AEPB Inhale 1 puff into the lungs daily at 6 (six) AM.    Allergies:   Patient has no known allergies.   Social History   Tobacco Use   Smoking status: Every Day    Packs/day: 1.00    Years: 7.00    Pack years: 7.00    Types: Cigarettes   Smokeless tobacco: Never   Tobacco comments:    smokes 1/2 pack per day 06/19/2020  Substance Use Topics   Alcohol use: Not Currently   Drug use: No    Family Hx: The patient's family history includes Heart disease in his father and mother.  Review of Systems  Constitutional: Negative for chills and fever.  Musculoskeletal:  Negative for neck pain.    EKGs/Labs/Other Test Reviewed:    EKG:  EKG is not ordered today.  The ekg ordered today demonstrates n/a  Recent Labs: 11/12/2021: ALT 21 12/11/2021: BUN 15; Creatinine, Ser 0.91; Hemoglobin 13.8; Platelets 140;  Potassium 4.5; Sodium 137   Recent Lipid Panel Recent Labs    12/10/21 0322  CHOL 114  TRIG 115  HDL 33*  VLDL 23  LDLCALC 58     Risk Assessment/Calculations:         Physical Exam:    VS:  BP (!) 146/68   Pulse 86   Ht '5\' 7"'$  (1.702 m)   Wt 230 lb 12.8 oz (104.7 kg)   SpO2 97%   BMI 36.15 kg/m     Wt Readings from Last 3 Encounters:  12/31/21 230  lb 12.8 oz (104.7 kg)  12/26/21 230 lb 2 oz (104.4 kg)  12/11/21 229 lb 6.4 oz (104.1 kg)    Constitutional:      Appearance: Healthy appearance. Not in distress.  Neck:     Thyroid: No thyromegaly.     Vascular: JVD normal.  Pulmonary:     Effort: Pulmonary effort is normal.     Breath sounds: No wheezing. No rales.  Cardiovascular:     Normal rate. Regular rhythm. Normal S1. Normal S2.      Murmurs: There is no murmur.     Comments: R wrist without hematoma Edema:    Peripheral edema absent.  Abdominal:     Palpations: Abdomen is soft. There is no hepatomegaly.  Musculoskeletal:     Comments: R trap area tender to palp Skin:    General: Skin is warm and dry.  Neurological:     General: No focal deficit present.     Mental Status: Alert and oriented to person, place and time.     Cranial Nerves: Cranial nerves are intact.     Comments: R arm with notable easy fatigability during BP check        ASSESSMENT & PLAN:   CAD (coronary artery disease) History of prior non-STEMI in 2017 treated a DES to the OM1 and inferior STEMI in 2020 treated a DES to the RCA.  Recent admission with chest pain.  Cardiac enzymes were negative.  Cardiac catheterization demonstrated patent stents.  He does have severe disease and RPL branch which is best managed medically.  PCI could be considered for refractory angina.  He is currently doing well without anginal symptoms.  Continue aspirin 81 mg daily, Lipitor 80 mg daily, Plavix 75 mg daily, Imdur 60 mg daily, Toprol-XL 25 mg daily.  Follow-up 6 months.  Right arm pain His arm  discomfort seems to be neurogenic.  This may be coming from his neck.  I do not think it is related to his cardiac catheterization.  He did have some bleeding post cath and had to remain overnight.  I will obtain a right upper extremity arterial ultrasound to be complete.  However, I have asked him to call his PCP today for earlier follow-up and evaluation.  Essential hypertension The patient's blood pressure is controlled on his current regimen.  Continue current therapy.   Hyperlipidemia LDL goal <70 LDL at goal.  Continue Lipitor 80 mg daily.  PAD (peripheral artery disease) ([place]) He is to have lower extremity arterial ultrasound arranged due to recent abnormal ABIs.  We will check with Dr. Kennon Holter RN to see if this is being arranged.             Dispo:  Return in about 6 months (around 07/02/2022) for Routine Follow Up, w/ [name], or [name], PA-C.   Medication Adjustments/Labs and Tests Ordered: Current medicines are reviewed at length with the patient today.  Concerns regarding medicines are outlined above.  Tests Ordered: Orders Placed This Encounter  Procedures   VAS [place] UPPER EXTREMITY ARTERIAL DUPLEX   Medication Changes: No orders of the defined types were placed in this encounter.  Signed, [name], PA-C  12/31/2021 11:25 AM    [facility] [address], [place], [place]  [address] Phone: [phone]; Fax: [phone]

## 2021-12-31 ENCOUNTER — Ambulatory Visit: Payer: Medicaid Other | Admitting: Physician Assistant

## 2021-12-31 ENCOUNTER — Encounter: Payer: Self-pay | Admitting: Physician Assistant

## 2021-12-31 ENCOUNTER — Emergency Department (HOSPITAL_COMMUNITY): Payer: Medicaid Other

## 2021-12-31 ENCOUNTER — Other Ambulatory Visit: Payer: Self-pay

## 2021-12-31 ENCOUNTER — Emergency Department (HOSPITAL_COMMUNITY)
Admission: EM | Admit: 2021-12-31 | Discharge: 2021-12-31 | Disposition: A | Discharge status: Home / Self Care | Payer: Medicaid Other | Attending: Emergency Medicine | Admitting: Emergency Medicine

## 2021-12-31 ENCOUNTER — Emergency Department (HOSPITAL_BASED_OUTPATIENT_CLINIC_OR_DEPARTMENT_OTHER): Payer: Medicaid Other

## 2021-12-31 VITALS — BP 146/68 | HR 86 | Ht 67.0 in | Wt 230.8 lb

## 2021-12-31 DIAGNOSIS — I739 Peripheral vascular disease, unspecified: Secondary | ICD-10-CM

## 2021-12-31 DIAGNOSIS — T81718A Complication of other artery following a procedure, not elsewhere classified, initial encounter: Secondary | ICD-10-CM

## 2021-12-31 DIAGNOSIS — M5412 Radiculopathy, cervical region: Secondary | ICD-10-CM | POA: Diagnosis not present

## 2021-12-31 DIAGNOSIS — E785 Hyperlipidemia, unspecified: Secondary | ICD-10-CM

## 2021-12-31 DIAGNOSIS — M5410 Radiculopathy, site unspecified: Secondary | ICD-10-CM

## 2021-12-31 DIAGNOSIS — M25511 Pain in right shoulder: Secondary | ICD-10-CM | POA: Diagnosis present

## 2021-12-31 DIAGNOSIS — M79621 Pain in right upper arm: Secondary | ICD-10-CM

## 2021-12-31 DIAGNOSIS — I25119 Atherosclerotic heart disease of native coronary artery with unspecified angina pectoris: Secondary | ICD-10-CM | POA: Diagnosis not present

## 2021-12-31 DIAGNOSIS — I10 Essential (primary) hypertension: Secondary | ICD-10-CM

## 2021-12-31 DIAGNOSIS — M79601 Pain in right arm: Secondary | ICD-10-CM

## 2021-12-31 LAB — BASIC METABOLIC PANEL
Anion gap: 5 (ref 5–15)
BUN: 16 mg/dL (ref 6–20)
CO2: 29 mmol/L (ref 22–32)
Calcium: 9.1 mg/dL (ref 8.9–10.3)
Chloride: 104 mmol/L (ref 98–111)
Creatinine, Ser: 0.99 mg/dL (ref 0.61–1.24)
GFR, Estimated: >60 mL/min (ref >60)
Glucose, Bld: 96 mg/dL (ref 70–99)
Potassium: 4.4 mmol/L (ref 3.5–5.1)
Sodium: 138 mmol/L (ref 135–145)

## 2021-12-31 LAB — CBC
HCT: 46.2 % (ref 39.0–52.0)
Hemoglobin: 15.2 g/dL (ref 13.0–17.0)
MCH: 28.1 pg (ref 26.0–34.0)
MCHC: 32.9 g/dL (ref 30.0–36.0)
MCV: 85.4 fL (ref 80.0–100.0)
Platelets: 184 10*3/uL (ref 150–400)
RBC: 5.41 MIL/uL (ref 4.22–5.81)
RDW: 13.9 % (ref 11.5–15.5)
WBC: 6 10*3/uL (ref 4.0–10.5)
nRBC: 0 % (ref 0.0–0.2)

## 2021-12-31 MED ORDER — METHOCARBAMOL 500 MG PO TABS
500.0000 mg | ORAL_TABLET | Freq: Four times a day (QID) | ORAL | 0 refills | Status: DC
  Filled 2021-12-31: qty 20, 5d supply, fill #0

## 2021-12-31 MED ORDER — HYDROCODONE-ACETAMINOPHEN 5-325 MG PO TABS
2.0000 | ORAL_TABLET | Freq: Once | ORAL | Status: AC
  Administered 2021-12-31: 2 via ORAL
  Filled 2021-12-31: qty 2

## 2021-12-31 MED ORDER — HYDROCODONE-ACETAMINOPHEN 5-325 MG PO TABS: 1.0000 | ORAL_TABLET | ORAL | 0 refills | Status: DC | PRN

## 2021-12-31 MED ORDER — HYDROCODONE-ACETAMINOPHEN 5-325 MG PO TABS
1.0000 | ORAL_TABLET | ORAL | 0 refills | Status: DC | PRN
  Filled 2021-12-31: qty 20, 4d supply, fill #0

## 2021-12-31 MED ORDER — METHOCARBAMOL 500 MG PO TABS: 500.0000 mg | ORAL_TABLET | Freq: Four times a day (QID) | ORAL | 0 refills | Status: DC

## 2021-12-31 NOTE — Assessment & Plan Note (Signed)
He is to have lower extremity arterial ultrasound arranged due to recent abnormal ABIs.  We will check with Dr. Kennon Holter RN to see if this is being arranged.

## 2021-12-31 NOTE — Assessment & Plan Note (Signed)
LDL at goal.  Continue Lipitor 80 mg daily.

## 2021-12-31 NOTE — Assessment & Plan Note (Signed)
History of prior non-STEMI in 2017 treated a DES to the OM1 and inferior STEMI in 2020 treated a DES to the RCA.  Recent admission with chest pain.  Cardiac enzymes were negative.  Cardiac catheterization demonstrated patent stents.  He does have severe disease and RPL branch which is best managed medically.  PCI could be considered for refractory angina.  He is currently doing well without anginal symptoms.  Continue aspirin 81 mg daily, Lipitor 80 mg daily, Plavix 75 mg daily, Imdur 60 mg daily, Toprol-XL 25 mg daily.  Follow-up 6 months.

## 2021-12-31 NOTE — ED Provider Notes (Signed)
[facility] EMERGENCY DEPARTMENT Provider Note   CSN: [id number] Arrival date & time: 12/31/21  1204     History  Chief Complaint  Patient presents with   Shoulder Pain    Jeffrey Winters is a 56 y.o. male.  Pt complains of pain in his neck and right shoulder.  Pt complains of numbness down his arm.    The history is provided by the patient. No language interpreter was used.  Shoulder Pain Location:  Arm Arm location:  R arm Injury: no   Pain details:    Quality:  Aching   Radiates to:  Does not radiate   Severity:  Moderate   Onset quality:  Gradual   Duration:  2 weeks   Timing:  Constant Handedness:  Right-handed Dislocation: no   Foreign body present:  No foreign bodies Prior injury to area:  No Relieved by:  Nothing Worsened by:  Nothing Ineffective treatments:  None tried Associated symptoms: neck pain       Home Medications Prior to Admission medications   Medication Sig Start Date End Date Taking? Authorizing Provider  albuterol (VENTOLIN HFA) 108 (90 Base) MCG/ACT inhaler INHALE 1-2 PUFFS INTO THE LUNGS EVERY 6 (SIX) HOURS AS NEEDED FOR WHEEZING OR SHORTNESS OF BREATH. 12/26/21 12/26/22  Fenton Foy, NP  ammonium lactate (AMLACTIN) 12 % lotion Apply 1 application topically as needed for dry skin. 09/11/21   Trula Slade, DPM  aspirin EC 81 MG tablet Take 1 tablet orally daily 03/13/21     atorvastatin (LIPITOR) 80 MG tablet Take 1 tablet by mouth daily 09/10/21   [name] T, PA-C  buPROPion (WELLBUTRIN XL) 150 MG 24 hr tablet Take 1 tablet by mouth at bedtime 03/13/21     clopidogrel (PLAVIX) 75 MG tablet Take 1 tablet by mouth daily 09/10/21   [name] T, PA-C  escitalopram (LEXAPRO) 10 MG tablet Take 10 mg by mouth daily. 04/03/21   [provider]  famotidine (PEPCID) 20 MG tablet Take 1 tablet by mouth 2 times daily 03/13/21     HYDROcodone-acetaminophen (NORCO/VICODIN) 5-325 MG tablet Take 1 tablet by mouth every 6  (six) hours as needed for moderate pain. 12/03/20   [provider]  isosorbide mononitrate (IMDUR) 60 MG 24 hr tablet Take 1 tablet (60 mg total) by mouth daily. 12/11/21   Margie Billet, PA-C  metoprolol succinate (TOPROL-XL) 25 MG 24 hr tablet Take 1 tablet by mouth daily, may take one additional tablet as needed for palpitations 09/10/21   [name] T, PA-C  nitroGLYCERIN (NITROSTAT) 0.4 MG SL tablet Take 1 tablet sublingually every 5 minutes as needed for chest pain 03/13/21     predniSONE (DELTASONE) 20 MG tablet Take 1 tablet (20 mg total) by mouth daily with breakfast for 5 days. 12/26/21 12/31/21  Fenton Foy, NP  umeclidinium-vilanterol [facility] ELLIPTA) 62.5-25 MCG/ACT AEPB Inhale 1 puff into the lungs daily at 6 (six) AM. 12/26/21 03/26/22  Fenton Foy, NP      Allergies    Patient has no known allergies.    Review of Systems   Review of Systems  Musculoskeletal:  Positive for neck pain.  All other systems reviewed and are negative.  Physical Exam Updated Vital Signs BP 138/85 (BP Location: Left Arm)   Pulse (!) 59   Temp 97.9 F (36.6 C) (Oral)   Resp 15   SpO2 98%  Physical Exam Vitals and nursing note reviewed.  Constitutional:  General: He is not in acute distress.    Appearance: He is well-developed.  HENT:     Head: Normocephalic.  Eyes:     Conjunctiva/sclera: Conjunctivae normal.  Cardiovascular:     Rate and Rhythm: Normal rate and regular rhythm.     Heart sounds: No murmur heard. Pulmonary:     Effort: Pulmonary effort is normal. No respiratory distress.     Breath sounds: Normal breath sounds.  Abdominal:     Palpations: Abdomen is soft.     Tenderness: There is no abdominal tenderness.  Musculoskeletal:        General: No swelling.     Cervical back: Neck supple.  Skin:    General: Skin is warm and dry.     Capillary Refill: Capillary refill takes less than 2 seconds.  Neurological:     Mental Status: He is alert.   Psychiatric:        Mood and Affect: Mood normal.    ED Results / Procedures / Treatments   Labs (all labs ordered are listed, but only abnormal results are displayed) Labs Reviewed  BASIC METABOLIC PANEL  CBC    EKG None  Radiology DG Cervical Spine Complete  Result Date: 12/31/2021 CLINICAL DATA:  Neck pain EXAM: CERVICAL SPINE - COMPLETE 4+ VIEW COMPARISON:  None Available. FINDINGS: Normal alignment.  No fracture or mass. Disc degeneration and anterior spurring C3 through C7. Mild posterior spurring C3-4, C4-5, C5-6 Bilateral foraminal encroachment C3 through C7 due to uncinate spurring IMPRESSION: Cervical spondylosis.  Bilateral foraminal stenosis due to spurring. Electronically Signed   By: Franchot Gallo M.D.   On: 12/31/2021 14:06   DG Shoulder Right  Result Date: 12/31/2021 CLINICAL DATA:  Pain EXAM: RIGHT SHOULDER - 3 VIEW COMPARISON:  None Available. FINDINGS: There is no evidence of fracture or dislocation. There is no evidence of arthropathy or other focal bone abnormality. Soft tissues are unremarkable. IMPRESSION: Negative. Electronically Signed   By: Yetta Glassman M.D.   On: 12/31/2021 14:04   VAS [place] UPPER EXTREMITY ARTERIAL DUPLEX  Result Date: 12/31/2021  UPPER EXTREMITY DUPLEX STUDY Patient Name:  Jeffrey Winters  Date of Exam:   12/31/2021 Medical Rec #: [id number]       Accession #:    [id number] Date of Birth: 10/04/1965       Patient Gender: M Patient Age:   15 years Exam Location:  [facility] Procedure:      VAS [place] UPPER EXTREMITY ARTERIAL DUPLEX Referring Phys:   --------------------------------------------------------------------------------  Indications: Pain in right upper arm. History:     Patient has a history of catheterization via right radial artery              and on 12/10/2021.  Risk Factors: Coronary artery disease. Comparison Study: No priors. Performing Technologist: Oda Cogan RDMS, RVT  Examination Guidelines: A complete  evaluation includes B-mode imaging, spectral Doppler, color Doppler, and power Doppler as needed of all accessible portions of each vessel. Bilateral testing is considered an integral part of a complete examination. Limited examinations for reoccurring indications may be performed as noted.  Right Doppler Findings: +-------------+----------+--------+--------+--------+ Site         PSV (cm/s)WaveformStenosisComments +-------------+----------+--------+--------+--------+ Brachial Prox51                                 +-------------+----------+--------+--------+--------+ Radial Prox  55                                 +-------------+----------+--------+--------+--------+  Radial Dist  57                                 +-------------+----------+--------+--------+--------+ Ulnar Prox   60                                 +-------------+----------+--------+--------+--------+ Ulnar Dist   52                                 +-------------+----------+--------+--------+--------+    Summary:  Right: No evidence of right pseudoaneurysm or AVF at radial artery        cath site. *See table(s) above for measurements and observations.    Preliminary     Procedures Procedures    Medications Ordered in ED Medications  HYDROcodone-acetaminophen (NORCO/VICODIN) 5-325 MG per tablet 2 tablet (2 tablets Oral Given 12/31/21 1408)    ED Course/ Medical Decision Making/ A&P                           Medical Decision Making Pt reports he has pain in his neck and right arm.  Pt had a cardiac cath 2 weeks ago.    Amount and/or Complexity of Data Reviewed External Data Reviewed: notes.    Details: Cardiology notes reviewed Labs: ordered. Decision-making details documented in ED Course.    Details: Labs ordered reviewed and interpreted CBC is normal be med is normal Radiology: ordered and independent interpretation performed. Decision-making details documented in ED Course.    Details:  Vascular study right arm shows no evidence of pseudoaneurysm are AV malformation. Cervical spine x-ray shows posterior spurring C3-4 C4-5 C5-6 with bilateral foraminal encroachment X-ray right shoulder no evidence of fracture or dislocation  Risk Prescription drug management. Risk Details: Patient has recently been on prednisone with no relief advised to schedule to follow-up with Dr. Ronnald Ramp to neurosurgery for evaluation.  Patient given a prescription for hydrocodone and Robaxin.           Final Clinical Impression(s) / ED Diagnoses Final diagnoses:  Radiculopathy, unspecified spinal region    Rx / DC Orders ED Discharge Orders          Ordered    HYDROcodone-acetaminophen (NORCO/VICODIN) 5-325 MG tablet  Every 4 hours PRN        12/31/21 1615    methocarbamol (ROBAXIN) 500 MG tablet  4 times daily        12/31/21 1615           An After Visit Summary was printed and given to the patient.    Fransico Meadow, [place] 12/31/21 1616    Milton Ferguson, MD 01/03/22 [phone]

## 2021-12-31 NOTE — Progress Notes (Signed)
Right upper ext. Arterial study  has been completed. Refer to [facility] under chart review to view preliminary results.   12/31/2021  3:55 PM , Bonnye Fava

## 2021-12-31 NOTE — ED Triage Notes (Signed)
Pt here for R shoulder pain  x2  weeks. Pt denies injury, reports pain starts on R side of neck and radiates down w/ sharp shooting pains into arm w/ intermittent numbness in R hand. Pt saw a PCP about this and was prescribed 5 days of prednisone, w/o relief.

## 2021-12-31 NOTE — Assessment & Plan Note (Signed)
His arm discomfort seems to be neurogenic.  This may be coming from his neck.  I do not think it is related to his cardiac catheterization.  He did have some bleeding post cath and had to remain overnight.  I will obtain a right upper extremity arterial ultrasound to be complete.  However, I have asked him to call his PCP today for earlier follow-up and evaluation.

## 2021-12-31 NOTE — Patient Instructions (Signed)
Medication Instructions:  Your physician recommends that you continue on your current medications as directed. Please refer to the Current Medication list given to you today.  *If you need a refill on your cardiac medications before your next appointment, please call your pharmacy*   Lab Work: None ordered  If you have labs (blood work) drawn today and your tests are completely normal, you will receive your results only by: Reed (if you have MyChart) OR A paper copy in the mail If you have any lab test that is abnormal or we need to change your treatment, we will call you to review the results.   Testing/Procedures: Your physician has requested that you have a  upper extremity arterial duplex. This test is an ultrasound of the arteries in the legs or arms. It looks at arterial blood flow in the legs and arms. Allow one hour for Lower and Upper Arterial scans. There are no restrictions or special instructions    Follow-Up: At [facility], you and your health needs are our priority.  As part of our continuing mission to provide you with exceptional heart care, we have created designated Provider Care Teams.  These Care Teams include your primary Cardiologist (physician) and Advanced Practice Providers (APPs -  Physician Assistants and Nurse Practitioners) who all work together to provide you with the care you need, when you need it.  We recommend signing up for the patient portal called "MyChart".  Sign up information is provided on this After Visit Summary.  MyChart is used to connect with patients for Virtual Visits (Telemedicine).  Patients are able to view lab/test results, encounter notes, upcoming appointments, etc.  Non-urgent messages can be sent to your provider as well.   To learn more about what you can do with MyChart, go to [email or web address].    Your next appointment:   6 month(s)  The format for your next appointment:   In Person  Provider:   Sherren Mocha, MD  or [name], PA-C         Other Instructions Call your primary care dr about your arm as well  Important Information About Sugar

## 2021-12-31 NOTE — ED Provider Triage Note (Signed)
Emergency Medicine Provider Triage Evaluation Note  Jeffrey Winters , a 56 y.o. male  was evaluated in triage.  Pt complains of right shoulder pain.  The patient states that he was admitted to the hospital in early May for chest pain.  2 days after discharge home he started noticing sharp pains in his right shoulder.  He states that he has burning electrical feelings that shoot down the inside of his right arm.  The third fourth and fifth digit on the right hand become intermittently numb and occasionally the entire hand becomes numb.  Denies shortness of breath, chest pain.  Endorses right arm pain, numbness.  Denies any recent injury or trauma  Review of Systems  Positive: Paresthesias, right arm pain Negative: Chest pain, shortness of breath  Physical Exam  There were no vitals taken for this visit. Gen:   Awake, no distress   Resp:  Normal effort  MSK:   Moves extremities without difficulty  Other:    Medical Decision Making  Medically screening exam initiated at 12:13 PM.  Appropriate orders placed.  [name] was informed that the remainder of the evaluation will be completed by another provider, this initial triage assessment does not replace that evaluation, and the importance of remaining in the ED until their evaluation is complete.     Dorothyann Peng, PA-C 12/31/21 1214

## 2021-12-31 NOTE — Assessment & Plan Note (Signed)
The patient's blood pressure is controlled on his current regimen.  Continue current therapy.  

## 2022-01-02 ENCOUNTER — Other Ambulatory Visit: Payer: Self-pay

## 2022-01-05 ENCOUNTER — Telehealth: Payer: Self-pay

## 2022-01-05 NOTE — Telephone Encounter (Signed)
Pt called and [facility] mess and wanted his PAIN med to be refilled

## 2022-01-08 ENCOUNTER — Ambulatory Visit: Payer: Medicaid Other | Admitting: Nurse Practitioner

## 2022-01-08 ENCOUNTER — Encounter: Payer: Self-pay | Admitting: Nurse Practitioner

## 2022-01-08 VITALS — BP 131/87 | HR 73 | Temp 97.9°F | Ht 67.0 in | Wt 228.4 lb

## 2022-01-08 DIAGNOSIS — M25511 Pain in right shoulder: Secondary | ICD-10-CM | POA: Diagnosis not present

## 2022-01-08 DIAGNOSIS — G8929 Other chronic pain: Secondary | ICD-10-CM

## 2022-01-08 MED ORDER — MELOXICAM 7.5 MG PO TABS: 7.5000 mg | ORAL_TABLET | Freq: Every day | ORAL | 0 refills | Status: DC

## 2022-01-08 MED ORDER — METHOCARBAMOL 500 MG PO TABS: 500.0000 mg | ORAL_TABLET | Freq: Four times a day (QID) | ORAL | 0 refills | Status: DC

## 2022-01-08 MED ORDER — UMECLIDINIUM-VILANTEROL 62.5-25 MCG/ACT IN AEPB: 1.0000 | INHALATION_SPRAY | Freq: Every day | RESPIRATORY_TRACT | 2 refills | Status: AC

## 2022-01-08 NOTE — Progress Notes (Signed)
$'@Patient'K$  ID: Jeffrey Winters, male    DOB: 26-Aug-1965, 56 y.o.   MRN: [id number]  Chief Complaint  Patient presents with   Hospitalization Follow-up    Patient is here today for his hospital follow up for right shoulder pains. Patient states that he is still having pains in his right shoulder and he has taken prednisone and Robaxin medication but nothing is relieving the pain. Patient states he has never had physical therapy or seen an orthopedic.      Referring provider: Fenton Foy, NP   HPI  Patient presents today for an ED follow-up.  Patient was seen in the ED on 12/31/2021 for ongoing right shoulder pain.  He was last seen in this office on 12/26/2021 and was prescribed prednisone for this issue.  He states that this did help while he was taking it but the pain returned once he finished the medication.  Right shoulder x-ray was clear in the ED but C-spine x-ray did show cervical spondylosis and bilateral foraminal stenosis.  He was prescribed Robaxin and Norco.  Completed both of these medications states that his shoulder is still hurting.  We discussed that we will refer him to Ortho and refill Robaxin and trial of Mobic.     No Known Allergies   There is no immunization history on file for this patient.  Past Medical History:  Diagnosis Date   Coronary artery disease 2017   MI s/p PCI to OM2 // s/p MI 07/2019 >> DES to RCA; residual RPL2 80 // Myoview 08/2019: EF 61, small area of mod ischemia inf base; Low Risk   Hypertension    MI (myocardial infarction) ([place])    PAD (peripheral artery disease) ([place])    LE Art [place] 08/2019: R SFA 50-74; L SFA 50-74    Tobacco History: Social History   Tobacco Use  Smoking Status Every Day   Packs/day: 1.00   Years: 7.00   Total pack years: 7.00   Types: Cigarettes  Smokeless Tobacco Never  Tobacco Comments   smokes 1/2 pack per day 06/19/2020   Ready to quit: Not Answered Counseling given: Not Answered Tobacco comments: smokes  1/2 pack per day 06/19/2020   Outpatient Encounter Medications as of 01/08/2022  Medication Sig   albuterol (VENTOLIN HFA) 108 (90 Base) MCG/ACT inhaler INHALE 1-2 PUFFS INTO THE LUNGS EVERY 6 (SIX) HOURS AS NEEDED FOR WHEEZING OR SHORTNESS OF BREATH.   ammonium lactate (AMLACTIN) 12 % lotion Apply 1 application topically as needed for dry skin.   aspirin EC 81 MG tablet Take 1 tablet orally daily   atorvastatin (LIPITOR) 80 MG tablet Take 1 tablet by mouth daily   buPROPion (WELLBUTRIN XL) 150 MG 24 hr tablet Take 1 tablet by mouth at bedtime   clopidogrel (PLAVIX) 75 MG tablet Take 1 tablet by mouth daily   escitalopram (LEXAPRO) 10 MG tablet Take 10 mg by mouth daily.   famotidine (PEPCID) 20 MG tablet Take 1 tablet by mouth 2 times daily   isosorbide mononitrate (IMDUR) 60 MG 24 hr tablet Take 1 tablet (60 mg total) by mouth daily.   meloxicam (MOBIC) 7.5 MG tablet Take 1 tablet (7.5 mg total) by mouth daily.   metoprolol succinate (TOPROL-XL) 25 MG 24 hr tablet Take 1 tablet by mouth daily, may take one additional tablet as needed for palpitations   nitroGLYCERIN (NITROSTAT) 0.4 MG SL tablet Take 1 tablet sublingually every 5 minutes as needed for chest pain   [DISCONTINUED] methocarbamol (  ROBAXIN) 500 MG tablet Take 1 tablet (500 mg total) by mouth 4 (four) times daily.   [DISCONTINUED] umeclidinium-vilanterol (ANORO ELLIPTA) 62.5-25 MCG/ACT AEPB Inhale 1 puff into the lungs daily at 6 (six) AM.   HYDROcodone-acetaminophen (NORCO/VICODIN) 5-325 MG tablet Take 1 tablet by mouth every 4 (four) hours as needed for moderate pain. (Patient not taking: Reported on 01/08/2022)   methocarbamol (ROBAXIN) 500 MG tablet Take 1 tablet (500 mg total) by mouth 4 (four) times daily.   umeclidinium-vilanterol (ANORO ELLIPTA) 62.5-25 MCG/ACT AEPB Inhale 1 puff into the lungs daily at 6 (six) AM.   No facility-administered encounter medications on file as of 01/08/2022.     Review of Systems  Review of  Systems  Constitutional: Negative.   HENT: Negative.    Cardiovascular: Negative.   Gastrointestinal: Negative.   Musculoskeletal:        Right shoulder and neck pain  Allergic/Immunologic: Negative.   Neurological: Negative.   Psychiatric/Behavioral: Negative.         Physical Exam  BP 131/87   Pulse 73   Temp 97.9 F (36.6 C)   Ht '5\' 7"'$  (1.702 m)   Wt 228 lb 6.4 oz (103.6 kg)   SpO2 99%   BMI 35.77 kg/m   Wt Readings from Last 5 Encounters:  01/08/22 228 lb 6.4 oz (103.6 kg)  12/31/21 230 lb 12.8 oz (104.7 kg)  12/26/21 230 lb 2 oz (104.4 kg)  12/11/21 229 lb 6.4 oz (104.1 kg)  09/10/21 232 lb (105.2 kg)     Physical Exam Vitals and nursing note reviewed.  Constitutional:      General: He is not in acute distress.    Appearance: He is well-developed.  Cardiovascular:     Rate and Rhythm: Normal rate and regular rhythm.  Pulmonary:     Effort: Pulmonary effort is normal.     Breath sounds: Normal breath sounds.  Musculoskeletal:     Cervical back: Normal range of motion.  Skin:    General: Skin is warm and dry.  Neurological:     Mental Status: He is alert and oriented to person, place, and time.      Lab Results:  CBC    Component Value Date/Time   WBC 6.0 12/31/2021 1240   RBC 5.41 12/31/2021 1240   HGB 15.2 12/31/2021 1240   HGB 14.2 11/07/2019 1432   HCT 46.2 12/31/2021 1240   HCT 43.1 11/07/2019 1432   PLT 184 12/31/2021 1240   PLT 178 11/07/2019 1432   MCV 85.4 12/31/2021 1240   MCV 83 11/07/2019 1432   MCH 28.1 12/31/2021 1240   MCHC 32.9 12/31/2021 1240   RDW 13.9 12/31/2021 1240   RDW 13.9 11/07/2019 1432   LYMPHSABS 2,146 11/12/2021 0933   MONOABS 0.7 11/26/2020 0401   EOSABS 49 11/12/2021 0933   BASOSABS 29 11/12/2021 0933    BMET    Component Value Date/Time   NA 138 12/31/2021 1240   NA 138 09/10/2021 1044   K 4.4 12/31/2021 1240   CL 104 12/31/2021 1240   CO2 29 12/31/2021 1240   GLUCOSE 96 12/31/2021 1240   BUN  16 12/31/2021 1240   BUN 14 09/10/2021 1044   CREATININE 0.99 12/31/2021 1240   CREATININE 0.99 10/31/2021 0930   CALCIUM 9.1 12/31/2021 1240   GFRNONAA >60 12/31/2021 1240   GFRAA 94 11/07/2019 1432    BNP No results found for: "BNP"  ProBNP    Component Value Date/Time   PROBNP  233 (H) 07/21/2019 1120    Imaging: VAS [place] UPPER EXTREMITY ARTERIAL DUPLEX  Result Date: 12/31/2021  UPPER EXTREMITY DUPLEX STUDY Patient Name:  COAL NEARHOOD Plaza  Date of Exam:   12/31/2021 Medical Rec #: [id number]       Accession #:    [id number] Date of Birth: June 19, 1966       Patient Gender: M Patient Age:   17 years Exam Location:  [facility] Procedure:      VAS [place] UPPER EXTREMITY ARTERIAL DUPLEX Referring Phys: LESLIE SOFIA --------------------------------------------------------------------------------  Indications: Pain in right upper arm. History:     Patient has a history of catheterization via right radial artery              and on 12/10/2021.  Risk Factors: Coronary artery disease. Comparison Study: No priors. Performing Technologist: Oda Cogan RDMS, RVT  Examination Guidelines: A complete evaluation includes B-mode imaging, spectral Doppler, color Doppler, and power Doppler as needed of all accessible portions of each vessel. Bilateral testing is considered an integral part of a complete examination. Limited examinations for reoccurring indications may be performed as noted.  Right Doppler Findings: +-------------+----------+--------+--------+--------+ Site         PSV (cm/s)WaveformStenosisComments +-------------+----------+--------+--------+--------+ Brachial Prox51                                 +-------------+----------+--------+--------+--------+ Radial Prox  55                                 +-------------+----------+--------+--------+--------+ Radial Dist  57                                 +-------------+----------+--------+--------+--------+ Ulnar Prox   60                                  +-------------+----------+--------+--------+--------+ Ulnar Dist   52                                 +-------------+----------+--------+--------+--------+    Summary:  Right: No evidence of right pseudoaneurysm or AVF at radial artery        cath site. *See table(s) above for measurements and observations. Electronically signed by Harold Barban MD on 12/31/2021 at 7:56:18 PM.    Final    DG Cervical Spine Complete  Result Date: 12/31/2021 CLINICAL DATA:  Neck pain EXAM: CERVICAL SPINE - COMPLETE 4+ VIEW COMPARISON:  None Available. FINDINGS: Normal alignment.  No fracture or mass. Disc degeneration and anterior spurring C3 through C7. Mild posterior spurring C3-4, C4-5, C5-6 Bilateral foraminal encroachment C3 through C7 due to uncinate spurring IMPRESSION: Cervical spondylosis.  Bilateral foraminal stenosis due to spurring. Electronically Signed   By: Franchot Gallo M.D.   On: 12/31/2021 14:06   DG Shoulder Right  Result Date: 12/31/2021 CLINICAL DATA:  Pain EXAM: RIGHT SHOULDER - 3 VIEW COMPARISON:  None Available. FINDINGS: There is no evidence of fracture or dislocation. There is no evidence of arthropathy or other focal bone abnormality. Soft tissues are unremarkable. IMPRESSION: Negative. Electronically Signed   By: Yetta Glassman M.D.   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 The right PLA branch could be treated with PCI if pt has medically refractory angina.   ECHOCARDIOGRAM COMPLETE  Result Date: 12/10/2021    ECHOCARDIOGRAM REPORT   Patient Name:   FOX SALMINEN Shan Date of Exam: 12/10/2021 Medical Rec #:  [id number]      Height:       66.0 in  Accession #:    [id number]     Weight:       250.0 lb Date of Birth:  [birth year]      BSA:          2.199 m Patient Age:    30 years       BP:           151/85 mmHg Patient Gender: M              HR:           52 bpm. Exam Location:  Inpatient Procedure: 2D Echo, Cardiac Doppler and Color Doppler Indications:    Chest pain  History:        Patient has prior history of Echocardiogram examinations, most                 recent 07/11/2019. CAD, COPD; Risk Factors:Hypertension.  Sonographer:    Jefferey Pica Referring Phys: [id number] Margie Billet IMPRESSIONS  1. Left ventricular ejection fraction, by estimation, is 55%. The left ventricle has normal function. The left ventricle has no regional wall motion abnormalities. Left ventricular diastolic parameters were normal.  2. Right ventricular systolic function is normal. The right ventricular size is normal. There is mildly elevated pulmonary artery systolic pressure. The estimated right ventricular systolic pressure is 00.8 mmHg.  3. Left atrial size was mildly dilated.  4. The mitral valve is abnormal. Trivial mitral valve regurgitation.  5. The aortic valve is tricuspid. Aortic valve regurgitation is not visualized. Aortic valve sclerosis is present, with no evidence of aortic valve stenosis.  6. The inferior vena cava is normal in size with <50% respiratory variability, suggesting right atrial pressure of 8 mmHg. Comparison(s): Compared to prior TTE in 07/2019, there is no significant change. Basal inferior hypokinesis not appreciated on current study. FINDINGS  Left Ventricle: Left ventricular ejection fraction, by estimation, is 55%. The left ventricle has normal function. The left ventricle has no regional wall motion abnormalities. The left ventricular internal cavity size was normal in size. There is no left ventricular hypertrophy. Left ventricular diastolic parameters were normal. Right Ventricle: The right ventricular size is normal. No increase in right  ventricular wall thickness. Right ventricular systolic function is normal. There is mildly elevated pulmonary artery systolic pressure. The tricuspid regurgitant velocity is 2.82  m/s, and with an assumed right atrial pressure of 8 mmHg, the estimated right ventricular systolic pressure is 67.6 mmHg. Left Atrium: Left atrial size was mildly dilated. Right Atrium: Right atrial size was normal in size. Pericardium: There is no evidence of pericardial effusion. Mitral Valve: The mitral valve is abnormal. There is mild thickening of the mitral valve leaflet(s). Trivial mitral valve regurgitation. Tricuspid Valve: The tricuspid valve is normal in structure. Tricuspid valve regurgitation is trivial. Aortic Valve: The aortic valve is tricuspid. Aortic valve regurgitation is not visualized. Aortic valve sclerosis is present, with no evidence of  aortic valve stenosis. Aortic valve peak gradient measures 5.7 mmHg. Pulmonic Valve: The pulmonic valve was normal in structure. Pulmonic valve regurgitation is trivial. Aorta: The aortic root and ascending aorta are structurally normal, with no evidence of dilitation. Venous: The inferior vena cava is normal in size with less than 50% respiratory variability, suggesting right atrial pressure of 8 mmHg. IAS/Shunts: The atrial septum is grossly normal.  LEFT VENTRICLE PLAX 2D LVIDd:         5.40 cm      Diastology LVIDs:         3.80 cm      LV e' medial:    8.40 cm/s LV PW:         1.00 cm      LV E/e' medial:  11.9 LV IVS:        1.00 cm      LV e' lateral:   10.70 cm/s LVOT diam:     2.10 cm      LV E/e' lateral: 9.3 LV SV:         78 LV SV Index:   35 LVOT Area:     3.46 cm  LV Volumes (MOD) LV vol d, MOD A4C: 144.0 ml LV vol s, MOD A4C: 90.4 ml LV SV MOD A4C:     144.0 ml RIGHT VENTRICLE             IVC RV S prime:     11.30 cm/s  IVC diam: 1.70 cm TAPSE (M-mode): 2.7 cm LEFT ATRIUM             Index        RIGHT ATRIUM           Index LA diam:        3.90 cm 1.77 cm/m   RA  Area:     18.60 cm LA Vol (A2C):   86.0 ml 39.11 ml/m  RA Volume:   50.40 ml  22.92 ml/m LA Vol (A4C):   76.4 ml 34.75 ml/m LA Biplane Vol: 85.7 ml 38.98 ml/m  AORTIC VALVE                 PULMONIC VALVE AV Area (Vmax): 2.70 cm     PV Vmax:       0.64 m/s AV Vmax:        119.50 cm/s  PV Peak grad:  1.7 mmHg AV Peak Grad:   5.7 mmHg LVOT Vmax:      93.30 cm/s LVOT Vmean:     63.100 cm/s LVOT VTI:       0.225 m  AORTA Ao Root diam: 2.90 cm Ao Asc diam:  3.40 cm MITRAL VALVE               TRICUSPID VALVE MV Area (PHT): 5.02 cm    TR Peak grad:   31.8 mmHg MV Decel Time: 151 msec    TR Vmax:        282.00 cm/s MV E velocity: 99.80 cm/s MV A velocity: 58.30 cm/s  SHUNTS MV E/A ratio:  1.71        Systemic VTI:  0.22 m                            Systemic Diam: 2.10 cm Gwyndolyn Kaufman MD Electronically signed by Gwyndolyn Kaufman MD Signature Date/Time: 12/10/2021/1:07:54 PM    Final    DG Chest 1 View  Result Date:  12/09/2021 CLINICAL DATA:  Chest pain. EXAM: CHEST  1 VIEW COMPARISON:  Chest x-ray 07/04/2020. FINDINGS: No consolidation. No visible pleural effusions or pneumothorax. Cardiomediastinal silhouette is within normal limits and unchanged. No acute osseous abnormality. IMPRESSION: No evidence of acute cardiopulmonary disease. Electronically Signed   By: Margaretha Sheffield M.D.   On: 12/09/2021 12:26     Assessment & Plan:   Chronic right shoulder pain - AMB referral to orthopedics - meloxicam (MOBIC) 7.5 MG tablet; Take 1 tablet (7.5 mg total) by mouth daily.  Dispense: 30 tablet; Refill: 0 - methocarbamol (ROBAXIN) 500 MG tablet; Take 1 tablet (500 mg total) by mouth 4 (four) times daily.  Dispense: 20 tablet; Refill: 0  Follow up:  Follow up as scheduled   Patient Instructions  1. Chronic right shoulder pain  - AMB referral to orthopedics - meloxicam (MOBIC) 7.5 MG tablet; Take 1 tablet (7.5 mg total) by mouth daily.  Dispense: 30 tablet; Refill: 0 - methocarbamol (ROBAXIN) 500 MG  tablet; Take 1 tablet (500 mg total) by mouth 4 (four) times daily.  Dispense: 20 tablet; Refill: 0  Follow up:  Follow up as scheduled     Shoulder Pain Many things can cause shoulder pain, including: An injury. Moving the shoulder in the same way again and again (overuse). Joint pain (arthritis). Pain can come from: Swelling and irritation (inflammation) of any part of the shoulder. An injury to the shoulder joint. An injury to: Tissues that connect muscle to bone (tendons). Tissues that connect bones to each other (ligaments). Bones. Follow these instructions at home: Watch for changes in your symptoms. Let your doctor know about them. Follow these instructions to help with your pain. If you have a sling: Wear the sling as told by your doctor. Remove it only as told by your doctor. Loosen the sling if your fingers: Tingle. Become numb. Turn cold and blue. Keep the sling clean. If the sling is not waterproof: Do not let it get wet. Take the sling off when you shower or bathe. Managing pain, stiffness, and swelling  If told, put ice on the painful area: Put ice in a plastic bag. Place a towel between your skin and the bag. Leave the ice on for 20 minutes, 2-3 times a day. Stop putting ice on if it does not help with the pain. Squeeze a soft ball or a foam pad as much as possible. This prevents swelling in the shoulder. It also helps to strengthen the arm. General instructions Take over-the-counter and prescription medicines only as told by your doctor. Keep all follow-up visits as told by your doctor. This is important. Contact a doctor if: Your pain gets worse. Medicine does not help your pain. You have new pain in your arm, hand, or fingers. Get help right away if: Your arm, hand, or fingers: Tingle. Are numb. Are swollen. Are painful. Turn white or blue. Summary Shoulder pain can be caused by many things. These include injury, moving the shoulder in the same  away again and again, and joint pain. Watch for changes in your symptoms. Let your doctor know about them. This condition may be treated with a sling, ice, and pain medicine. Contact your doctor if the pain gets worse or you have new pain. Get help right away if your arm, hand, or fingers tingle or get numb, swollen, or painful. Keep all follow-up visits as told by your doctor. This is important. This information is not intended to replace advice given to  you by your health care provider. Make sure you discuss any questions you have with your health care provider. Document Revised: 04/04/2021 Document Reviewed: 04/04/2021 Elsevier Patient Education  2023 [place], [place] 01/08/2022

## 2022-01-08 NOTE — Patient Instructions (Addendum)
1. Chronic right shoulder pain  - AMB referral to orthopedics - meloxicam (MOBIC) 7.5 MG tablet; Take 1 tablet (7.5 mg total) by mouth daily.  Dispense: 30 tablet; Refill: 0 - methocarbamol (ROBAXIN) 500 MG tablet; Take 1 tablet (500 mg total) by mouth 4 (four) times daily.  Dispense: 20 tablet; Refill: 0  Follow up:  Follow up as scheduled     Shoulder Pain Many things can cause shoulder pain, including: An injury. Moving the shoulder in the same way again and again (overuse). Joint pain (arthritis). Pain can come from: Swelling and irritation (inflammation) of any part of the shoulder. An injury to the shoulder joint. An injury to: Tissues that connect muscle to bone (tendons). Tissues that connect bones to each other (ligaments). Bones. Follow these instructions at home: Watch for changes in your symptoms. Let your doctor know about them. Follow these instructions to help with your pain. If you have a sling: Wear the sling as told by your doctor. Remove it only as told by your doctor. Loosen the sling if your fingers: Tingle. Become numb. Turn cold and blue. Keep the sling clean. If the sling is not waterproof: Do not let it get wet. Take the sling off when you shower or bathe. Managing pain, stiffness, and swelling  If told, put ice on the painful area: Put ice in a plastic bag. Place a towel between your skin and the bag. Leave the ice on for 20 minutes, 2-3 times a day. Stop putting ice on if it does not help with the pain. Squeeze a soft ball or a foam pad as much as possible. This prevents swelling in the shoulder. It also helps to strengthen the arm. General instructions Take over-the-counter and prescription medicines only as told by your doctor. Keep all follow-up visits as told by your doctor. This is important. Contact a doctor if: Your pain gets worse. Medicine does not help your pain. You have new pain in your arm, hand, or fingers. Get help right away  if: Your arm, hand, or fingers: Tingle. Are numb. Are swollen. Are painful. Turn white or blue. Summary Shoulder pain can be caused by many things. These include injury, moving the shoulder in the same away again and again, and joint pain. Watch for changes in your symptoms. Let your doctor know about them. This condition may be treated with a sling, ice, and pain medicine. Contact your doctor if the pain gets worse or you have new pain. Get help right away if your arm, hand, or fingers tingle or get numb, swollen, or painful. Keep all follow-up visits as told by your doctor. This is important. This information is not intended to replace advice given to you by your health care provider. Make sure you discuss any questions you have with your health care provider. Document Revised: 04/04/2021 Document Reviewed: 04/04/2021 Elsevier Patient Education  [place].

## 2022-01-08 NOTE — Assessment & Plan Note (Signed)
-   AMB referral to orthopedics - meloxicam (MOBIC) 7.5 MG tablet; Take 1 tablet (7.5 mg total) by mouth daily.  Dispense: 30 tablet; Refill: 0 - methocarbamol (ROBAXIN) 500 MG tablet; Take 1 tablet (500 mg total) by mouth 4 (four) times daily.  Dispense: 20 tablet; Refill: 0  Follow up:  Follow up as scheduled

## 2022-01-13 ENCOUNTER — Ambulatory Visit: Payer: Medicaid Other | Admitting: Physician Assistant

## 2022-01-13 ENCOUNTER — Encounter: Payer: Self-pay | Admitting: Physician Assistant

## 2022-01-13 DIAGNOSIS — M542 Cervicalgia: Secondary | ICD-10-CM

## 2022-01-13 DIAGNOSIS — M5412 Radiculopathy, cervical region: Secondary | ICD-10-CM | POA: Insufficient documentation

## 2022-01-13 DIAGNOSIS — M5010 Cervical disc disorder with radiculopathy, unspecified cervical region: Secondary | ICD-10-CM | POA: Diagnosis not present

## 2022-01-13 NOTE — Progress Notes (Signed)
Office Visit Note   Patient: Jeffrey Winters           Date of Birth: 12-08-1965           MRN: [id number] Visit Date: 01/13/2022              Requested by: Fenton Foy, NP [phone] N. [address] [place],  [place] [address] PCP: Fenton Foy, NP Cervical neck pain with radiculopathy     HPI: Patient is a pleasant 56 year old gentleman with a 1 month history of neck pain that radiates down his arm.  He denies any injuries he denies any previous history of back issues.  He has been given a steroid taper which did not help very much.  He also has tried anti-inflammatories and even narcotic pain medication.  He has had no relief in his symptoms and now has very little movement of his neck.  He also has numbness all the time in his ring and pinky finger on the right  Assessment & Plan: Visit Diagnoses:  1. Neck pain     Plan: Patient has significant cervical neck pain with very little movement in his neck.  He also has associated pain running down his arm.  He now has numbness in 2 digits of his hand his ring finger and pinky.  Given the time is gone on and the severity of his symptoms I recommended an MRI.  We could call him with the results if it would involve referring him for an injection  Follow-Up Instructions: No follow-ups on file.   Ortho Exam  Patient is alert, oriented, no adenopathy, well-dressed, normal affect, normal respiratory effort. Examination patient is obviously uncomfortable.  Does not like to move his head.  He is hesitant to touch his chin to his chest and only rolls his eyes up where when asked to extend his neck.  He has more pain when he is turning his head to the right than left.  Strength is 5 out of 5 bilaterally though grip strength might be slightly decreased in the right hand.  Distal pulses are intact.  Sensation in other digits is intact x-rays done previously showed some facet arthropathy with disc degeneration and anterior spurring at C3-C7 mild  posterior spurring at C3-4 C4-5 C5-6 and bilateral foraminal encroachment C3-C7  Imaging: No results found. No images are attached to the encounter.  Labs: Lab Results  Component Value Date   HGBA1C 6.1 (A) 12/26/2021   HGBA1C 6.1 12/26/2021   HGBA1C 6.1 12/26/2021   HGBA1C 6.1 12/26/2021     Lab Results  Component Value Date   ALBUMIN 4.1 09/10/2021   ALBUMIN 4.0 11/07/2019   ALBUMIN 3.7 (L) 10/31/2019    Lab Results  Component Value Date   MG 1.9 08/15/2019   No results found for: "VD25OH"  No results found for: "PREALBUMIN"    Latest Ref Rng & Units 12/31/2021   12:40 PM 12/11/2021    4:13 AM 12/10/2021    3:22 AM  CBC EXTENDED  WBC 4.0 - 10.5 K/uL 6.0  4.2  3.7   RBC 4.22 - 5.81 MIL/uL 5.41  4.94  4.90   Hemoglobin 13.0 - 17.0 g/dL 15.2  13.8  13.7   HCT 39.0 - 52.0 % 46.2  41.4  41.5   Platelets 150 - 400 K/uL 184  140  146      There is no height or weight on file to calculate BMI.  Orders:  No  orders of the defined types were placed in this encounter.  No orders of the defined types were placed in this encounter.    Procedures: No procedures performed  Clinical Data: No additional findings.  ROS:  All other systems negative, except as noted in the HPI. Review of Systems  Objective: Vital Signs: There were no vitals taken for this visit.  Specialty Comments:  No specialty comments available.  PMFS History: Patient Active Problem List   Diagnosis Date Noted   Radiculopathy, cervical region 01/13/2022   Chronic right shoulder pain 01/08/2022   Right arm pain 12/31/2021   Health care maintenance 12/26/2021   CAD (coronary artery disease) 07/08/2021   Facial cellulitis 11/25/2020   Essential hypertension 11/25/2020   COPD (chronic obstructive pulmonary disease) ([place]) 11/25/2020   Tobacco abuse 11/20/2020   Influenza vaccine refused 05/29/2020   PAD (peripheral artery disease) ([place])    Hyperlipidemia LDL goal <70 07/12/2019   Old MI  (myocardial infarction) 07/10/2019   Past Medical History:  Diagnosis Date   Coronary artery disease 2017   MI s/p PCI to OM2 // s/p MI 07/2019 >> DES to RCA; residual RPL2 80 // Myoview 08/2019: EF 61, small area of mod ischemia inf base; Low Risk   Hypertension    MI (myocardial infarction) ([place])    PAD (peripheral artery disease) ([place])    LE Art [place] 08/2019: R SFA 50-74; L SFA 50-74    Family History  Problem Relation Age of Onset   Heart disease Mother    Heart disease Father     Past Surgical History:  Procedure Laterality Date   ABDOMINAL AORTOGRAM W/LOWER EXTREMITY Bilateral 10/05/2019   Procedure: ABDOMINAL AORTOGRAM W/LOWER EXTREMITY;  Surgeon: Lorretta Harp, MD;  Location: [facility];  Service: Cardiovascular;  Laterality: Bilateral;   ABDOMINAL AORTOGRAM W/LOWER EXTREMITY N/A 11/09/2019   Procedure: ABDOMINAL AORTOGRAM W/LOWER EXTREMITY;  Surgeon: Lorretta Harp, MD;  Location: [facility];  Service: Cardiovascular;  Laterality: N/A;   CORONARY/GRAFT ACUTE MI REVASCULARIZATION N/A 07/10/2019   Procedure: CORONARY/GRAFT ACUTE MI REVASCULARIZATION;  Surgeon: Nelva Bush, MD;  Location: [facility];  Service: Cardiovascular;  Laterality: N/A;   INTRAVASCULAR LITHOTRIPSY  11/09/2019   Procedure: INTRAVASCULAR LITHOTRIPSY;  Surgeon: Lorretta Harp, MD;  Location: [facility];  Service: Cardiovascular;;   INTRAVASCULAR PRESSURE WIRE/FFR STUDY N/A 12/10/2021   Procedure: INTRAVASCULAR PRESSURE WIRE/FFR STUDY;  Surgeon: Sherren Mocha, MD;  Location: [facility];  Service: Cardiovascular;  Laterality: N/A;   LEFT HEART CATH AND CORONARY ANGIOGRAPHY N/A 07/10/2019   Procedure: LEFT HEART CATH AND CORONARY ANGIOGRAPHY;  Surgeon: Nelva Bush, MD;  Location: [facility];  Service: Cardiovascular;  Laterality: N/A;   LEFT HEART CATH AND CORONARY ANGIOGRAPHY N/A 12/10/2021   Procedure: LEFT HEART CATH AND CORONARY ANGIOGRAPHY;  Surgeon:  Sherren Mocha, MD;  Location: [facility];  Service: Cardiovascular;  Laterality: N/A;   PERCUTANEOUS CORONARY STENT INTERVENTION (PCI-S)  2017   DES to OM2 at [facility] Left 10/05/2019   Procedure: PERIPHERAL VASCULAR ATHERECTOMY;  Surgeon: Lorretta Harp, MD;  Location: [facility];  Service: Cardiovascular;  Laterality: Left;  SFA   PERIPHERAL VASCULAR BALLOON ANGIOPLASTY  11/09/2019   Procedure: PERIPHERAL VASCULAR BALLOON ANGIOPLASTY;  Surgeon: Lorretta Harp, MD;  Location: [facility];  Service: Cardiovascular;;   PERIPHERAL VASCULAR INTERVENTION Left 10/05/2019   Procedure: PERIPHERAL VASCULAR INTERVENTION;  Surgeon: Lorretta Harp, MD;  Location: [facility];  Service: Cardiovascular;  Laterality: Left;  SFA   Social History   Occupational History   Not on file  Tobacco Use   Smoking status: Every Day    Packs/day: 1.00    Years: 7.00    Total pack years: 7.00    Types: Cigarettes   Smokeless tobacco: Never   Tobacco comments:    smokes 1/2 pack per day 06/19/2020  Vaping Use   Vaping Use: Never used  Substance and Sexual Activity   Alcohol use: Not Currently   Drug use: No   Sexual activity: Not Currently

## 2022-01-19 ENCOUNTER — Ambulatory Visit (HOSPITAL_COMMUNITY)
Admission: RE | Admit: 2022-01-19 | Discharge: 2022-01-19 | Disposition: A | Discharge status: Home / Self Care | Payer: Medicaid Other | Source: Ambulatory Visit | Attending: Cardiology | Admitting: Cardiology

## 2022-01-19 DIAGNOSIS — I721 Aneurysm of artery of upper extremity: Secondary | ICD-10-CM

## 2022-01-19 DIAGNOSIS — T81718A Complication of other artery following a procedure, not elsewhere classified, initial encounter: Secondary | ICD-10-CM | POA: Diagnosis not present

## 2022-01-19 DIAGNOSIS — I729 Aneurysm of unspecified site: Secondary | ICD-10-CM | POA: Insufficient documentation

## 2022-01-21 NOTE — Progress Notes (Signed)
Pt has been made aware of normal result and verbalized understanding.  jw

## 2022-01-28 ENCOUNTER — Ambulatory Visit
Admission: RE | Admit: 2022-01-28 | Discharge: 2022-01-28 | Disposition: A | Discharge status: Home / Self Care | Payer: Medicaid Other | Source: Ambulatory Visit | Attending: Physician Assistant | Admitting: Physician Assistant

## 2022-01-28 DIAGNOSIS — M542 Cervicalgia: Secondary | ICD-10-CM

## 2022-01-30 ENCOUNTER — Other Ambulatory Visit: Payer: Self-pay | Admitting: Physician Assistant

## 2022-01-30 DIAGNOSIS — M5010 Cervical disc disorder with radiculopathy, unspecified cervical region: Secondary | ICD-10-CM

## 2022-02-03 ENCOUNTER — Other Ambulatory Visit: Payer: Self-pay | Admitting: Nurse Practitioner

## 2022-02-03 DIAGNOSIS — G8929 Other chronic pain: Secondary | ICD-10-CM

## 2022-02-04 ENCOUNTER — Telehealth: Payer: Self-pay | Admitting: *Deleted

## 2022-02-04 ENCOUNTER — Other Ambulatory Visit: Payer: Self-pay | Admitting: Physician Assistant

## 2022-02-04 NOTE — Telephone Encounter (Signed)
   Pre-operative Risk Assessment    Patient Name: Jeffrey Winters  DOB: June 18, 1966 MRN: [id number]      Request for Surgical Clearance    Procedure:   CERVICAL EPIDURAL  Date of Surgery:  Clearance TBD  BUT ASKING FOR ASAP SO THEY CAN GET PT IN                                 Surgeon:   Surgeon's Group or Practice Name:  [facility] Phone number:  [phone] Fax number:  [phone]   Type of Clearance Requested:   - Pharmacy:  Hold Clopidogrel (Plavix) X'S 5 DAYS   Type of Anesthesia:  Not Indicated   Additional requests/questions:    [name]   02/04/2022, 9:45 AM

## 2022-02-04 NOTE — Telephone Encounter (Signed)
   Name: Jeffrey Winters  DOB: 08/28/1965  MRN: [id number]  Primary Cardiologist: Sherren Mocha, MD   Preoperative team, please contact this patient and set up a phone call appointment for further preoperative risk assessment. Please obtain consent and complete medication review. Thank you for your help.  I confirm that guidance regarding antiplatelet and oral anticoagulation therapy has been completed and, if necessary, noted below.  Per office protocol, Plavix may be held for 5 days prior to procedure. We recommend continuing aspirin throughout the perioperative period. However, if aspirin needs to be held, please contact our office.   Lenna Sciara, NP 02/04/2022, 9:59 AM [facility] [address] [place], [place] [address]

## 2022-02-04 NOTE — Telephone Encounter (Signed)
Tried to call the pt to set up a tele visit for pre op clearance. Vm is full, could not leave a message to call back. Pt has appt with Dr. Gwenlyn Found 02/10/22, though Dr. Gwenlyn Found is the pt's PV MD, Primary cardiologist is [name]. We will need to setup a tele visit before we can clear the pt. I will send FYI to requesting in hopes they may s/w the pt, to please ask him to call our office for tele visit.

## 2022-02-05 ENCOUNTER — Telehealth: Payer: Self-pay | Admitting: *Deleted

## 2022-02-05 NOTE — Telephone Encounter (Signed)
I have s/w the pt and he is agreeable to plan of care for tele visit 02/10/22 @ 4 pm. Med rec and consent are done.

## 2022-02-05 NOTE — Telephone Encounter (Signed)
I have s/w the pt and he is agreeable to plan of care for tele visit 02/10/22 @ 4 pm. Med rec and consent are done.     Patient Consent for Virtual Visit        Jeffrey Winters has provided verbal consent on 02/05/2022 for a virtual visit (video or telephone).   CONSENT FOR VIRTUAL VISIT FOR:  Jeffrey Winters  By participating in this virtual visit I agree to the following:  I hereby voluntarily request, consent and authorize [facility] and its employed or contracted physicians, physician assistants, nurse practitioners or other licensed health care professionals (the Practitioner), to provide me with telemedicine health care services (the "Services") as deemed necessary by the treating Practitioner. I acknowledge and consent to receive the Services by the Practitioner via telemedicine. I understand that the telemedicine visit will involve communicating with the Practitioner through live audiovisual communication technology and the disclosure of certain medical information by electronic transmission. I acknowledge that I have been given the opportunity to request an in-person assessment or other available alternative prior to the telemedicine visit and am voluntarily participating in the telemedicine visit.  I understand that I have the right to withhold or withdraw my consent to the use of telemedicine in the course of my care at any time, without affecting my right to future care or treatment, and that the Practitioner or I may terminate the telemedicine visit at any time. I understand that I have the right to inspect all information obtained and/or recorded in the course of the telemedicine visit and may receive copies of available information for a reasonable fee.  I understand that some of the potential risks of receiving the Services via telemedicine include:  Delay or interruption in medical evaluation due to technological equipment failure or disruption; Information transmitted may not be  sufficient (e.g. poor resolution of images) to allow for appropriate medical decision making by the Practitioner; and/or  In rare instances, security protocols could fail, causing a breach of personal health information.  Furthermore, I acknowledge that it is my responsibility to provide information about my medical history, conditions and care that is complete and accurate to the best of my ability. I acknowledge that Practitioner's advice, recommendations, and/or decision may be based on factors not within their control, such as incomplete or inaccurate data provided by me or distortions of diagnostic images or specimens that may result from electronic transmissions. I understand that the practice of medicine is not an exact science and that Practitioner makes no warranties or guarantees regarding treatment outcomes. I acknowledge that a copy of this consent can be made available to me via my patient portal ([facility]), or I can request a printed copy by calling the office of [employer].    I understand that my insurance will be billed for this visit.   I have read or had this consent read to me. I understand the contents of this consent, which adequately explains the benefits and risks of the Services being provided via telemedicine.  I have been provided ample opportunity to ask questions regarding this consent and the Services and have had my questions answered to my satisfaction. I give my informed consent for the services to be provided through the use of telemedicine in my medical care

## 2022-02-05 NOTE — Telephone Encounter (Signed)
   Patient has 38 mm long stent in distal RCA placed in 2020.  Per protocol, given length of stent in 1 vessel, reviewed with [name].  Ok to hold Plavix for 5 days prior to [facility]. [name], PA-C    02/05/2022 1:15 PM

## 2022-02-10 ENCOUNTER — Other Ambulatory Visit: Payer: Self-pay | Admitting: Podiatry

## 2022-02-10 ENCOUNTER — Telehealth: Payer: Medicaid Other | Admitting: Nurse Practitioner

## 2022-02-10 ENCOUNTER — Other Ambulatory Visit: Payer: Self-pay

## 2022-02-10 ENCOUNTER — Ambulatory Visit: Payer: Medicaid Other | Admitting: Cardiovascular Disease

## 2022-02-10 ENCOUNTER — Encounter: Payer: Self-pay | Admitting: Cardiovascular Disease

## 2022-02-10 DIAGNOSIS — I10 Essential (primary) hypertension: Secondary | ICD-10-CM

## 2022-02-10 DIAGNOSIS — E782 Mixed hyperlipidemia: Secondary | ICD-10-CM

## 2022-02-10 DIAGNOSIS — I2510 Atherosclerotic heart disease of native coronary artery without angina pectoris: Secondary | ICD-10-CM

## 2022-02-10 DIAGNOSIS — I739 Peripheral vascular disease, unspecified: Secondary | ICD-10-CM | POA: Diagnosis not present

## 2022-02-10 MED ORDER — ATORVASTATIN CALCIUM 80 MG PO TABS: 80.0000 mg | ORAL_TABLET | Freq: Every day | ORAL | 3 refills | Status: DC

## 2022-02-10 MED ORDER — CLOPIDOGREL BISULFATE 75 MG PO TABS: 75.0000 mg | ORAL_TABLET | Freq: Every day | ORAL | 3 refills | Status: DC

## 2022-02-10 NOTE — Assessment & Plan Note (Signed)
Mr. Jeffrey Winters had bilateral SFA intervention by myself in March and April 2021.  He just currently denies claudication.  He does continue to smoke.  His most recent Doppler studies performed 05/21/2021 revealed ABIs greater than 1 bilaterally with in-stent restenosis within his left SFA stent.  We will continue to monitor him noninvasively on an annual basis.

## 2022-02-10 NOTE — Patient Instructions (Signed)
Medication Instructions:  Your physician recommends that you continue on your current medications as directed. Please refer to the Current Medication list given to you today.  *If you need a refill on your cardiac medications before your next appointment, please call your pharmacy*   Testing/Procedures: Your physician has requested that you have a lower extremity arterial duplex. This test is an ultrasound of the arteries in the legs. It looks at arterial blood flow in the legs. Allow one hour for Lower Arterial scans. There are no restrictions or special instructions  Your physician has requested that you have an ankle brachial index (ABI). During this test an ultrasound and blood pressure cuff are used to evaluate the arteries that supply the arms and legs with blood. Allow thirty minutes for this exam. There are no restrictions or special instructions. To be done in October. These procedures will be done at [facility]   Follow-Up: At [facility], you and your health needs are our priority.  As part of our continuing mission to provide you with exceptional heart care, we have created designated Provider Care Teams.  These Care Teams include your primary Cardiologist (physician) and Advanced Practice Providers (APPs -  Physician Assistants and Nurse Practitioners) who all work together to provide you with the care you need, when you need it.  We recommend signing up for the patient portal called "MyChart".  Sign up information is provided on this After Visit Summary.  MyChart is used to connect with patients for Virtual Visits (Telemedicine).  Patients are able to view lab/test results, encounter notes, upcoming appointments, etc.  Non-urgent messages can be sent to your provider as well.   To learn more about what you can do with MyChart, go to [email or web address].    Your next appointment:   12 month(s)  The format for your next appointment:   In Person  Provider:    Quay Burow, MD

## 2022-02-10 NOTE — Telephone Encounter (Signed)
   Primary Cardiologist: Sherren Mocha, MD  Chart reviewed as part of pre-operative protocol coverage. Given past medical history and time since last visit, based on ACC/AHA guidelines, MARQUEE FUCHS would be at acceptable risk for the planned procedure without further cardiovascular testing.  He can achieve greater than 4 METS activity without concerning symptoms. His RCRI for MACE is Class II, 0.9%.   He stopped his Plavix 3 or 4 days ago with guidance from cardiology. I advised him of the need for guidance for future events due to complicated PCI.   Patient was advised that if he develops new symptoms prior to surgery to contact our office to arrange a follow-up appointment.  He verbalized understanding.  I will route this recommendation to the requesting party via Epic fax function and remove from pre-op pool.  Please call with questions.  Emmaline Life, NP-C    02/10/2022, 3:47 PM [place] [place] [address] Office [phone] Fax [phone]

## 2022-02-10 NOTE — Progress Notes (Signed)
02/10/2022 INEZ ROSATO   12-09-1965  [id number]  Primary Physician Fenton Foy, NP Primary Cardiologist: Lorretta Harp MD FACP, [place], [place], [place]  HPI:  DOCK BACCAM is a 56 y.o.  mild to moderately overweight single African-American male with no children referred to me by [name], PA-C for treatment of symptomatic PAD.  He does not have a primary care provider.  I last saw him in the office/20/22.  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  I last saw him in the office 08/29/2019. His risk factors include 20 to 30 pack years of tobacco abuse continue to smoke 1/2 pack/day, treated hypertension hyperlipidemia.  There is no family history of heart disease.  He is never had a stroke.  He had a non-STEMI in December and had treatment of his distal RCA.  He had stenting of his distal RCA and obtuse marginal branch at [facility] 02/26/2016 as well.  He does complain of left [place] claudication at 50 to 75 feet which is symmetric bilaterally.  He had Doppler studies performed 08/09/2019 revealing normal ABIs with moderate disease in both SFAs.   I performed peripheral angiography on him 10/05/2019 revealing high-grade fairly focal mid bilateral SFA stenoses.  I performed [facility] 1 directional atherectomy followed by drug-coated balloon angioplasty of the mid left SFA with an excellent result.  His Dopplers normalized and his left calf claudication has resolved.    I performed right SFA intervention on him 11/09/2019 using intravascular lithotripsy followed by drug-coated balloon angioplasty.  His follow-up Doppler studies performed 11/16/2019 showed no change in the frequency mid right SFA or change in his ABIs although clinically he is improved.  Unfortunately, he does continue to smoke a half a pack a day.   Since I saw him a year ago he continues to do well.  He is still denies claudication chest pain or shortness of breath.  Dopplers  performed 05/21/2021 revealed ABIs are greater than 1 bilaterally with widely patent SFAs.  He does have however in-stent restenosis within the left SFA stent.   Current Meds  Medication Sig   albuterol (VENTOLIN HFA) 108 (90 Base) MCG/ACT inhaler INHALE 1-2 PUFFS INTO THE LUNGS EVERY 6 (SIX) HOURS AS NEEDED FOR WHEEZING OR SHORTNESS OF BREATH.   ammonium lactate (AMLACTIN) 12 % lotion Apply 1 application topically as needed for dry skin.   aspirin EC 81 MG tablet Take 1 tablet orally daily   buPROPion (WELLBUTRIN XL) 150 MG 24 hr tablet Take 1 tablet by mouth at bedtime   escitalopram (LEXAPRO) 10 MG tablet Take 10 mg by mouth daily.   famotidine (PEPCID) 20 MG tablet Take 1 tablet by mouth 2 times daily   HYDROcodone-acetaminophen (NORCO/VICODIN) 5-325 MG tablet Take 1 tablet by mouth every 4 (four) hours as needed for moderate pain.   isosorbide mononitrate (IMDUR) 60 MG 24 hr tablet Take 1 tablet (60 mg total) by mouth daily.   meloxicam (MOBIC) 7.5 MG tablet Take 1 tablet (7.5 mg total) by mouth daily.   methocarbamol (ROBAXIN) 500 MG tablet Take 1 tablet (500 mg total) by mouth 4 (four) times daily.   metoprolol succinate (TOPROL-XL) 25 MG 24 hr tablet Take 1 tablet by mouth daily, may take one additional tablet as needed for palpitations   nitroGLYCERIN (NITROSTAT) 0.4 MG SL tablet Take 1 tablet sublingually every 5 minutes as needed for chest pain   umeclidinium-vilanterol (ANORO  ELLIPTA) 62.5-25 MCG/ACT AEPB Inhale 1 puff into the lungs daily at 6 (six) AM.   [DISCONTINUED] atorvastatin (LIPITOR) 80 MG tablet Take 1 tablet by mouth daily   [DISCONTINUED] clopidogrel (PLAVIX) 75 MG tablet Take 1 tablet by mouth daily     No Known Allergies  Social History   Socioeconomic History   Marital status: Single    Spouse name: Not on file   Number of children: Not on file   Years of education: Not on file   Highest education level: Not on file  Occupational History   Not on file   Tobacco Use   Smoking status: Every Day    Packs/day: 1.00    Years: 7.00    Total pack years: 7.00    Types: Cigarettes   Smokeless tobacco: Never   Tobacco comments:    smokes 1/2 pack per day 06/19/2020  Vaping Use   Vaping Use: Never used  Substance and Sexual Activity   Alcohol use: Not Currently   Drug use: No   Sexual activity: Not Currently  Other Topics Concern   Not on file  Social History Narrative   Not on file   Social Determinants of Health   Financial Resource Strain: Not on file  Food Insecurity: Not on file  Transportation Needs: Not on file  Physical Activity: Not on file  Stress: Not on file  Social Connections: Not on file  Intimate Partner Violence: Not on file     Review of Systems: General: negative for chills, fever, night sweats or weight changes.  Cardiovascular: negative for chest pain, dyspnea on exertion, edema, orthopnea, palpitations, paroxysmal nocturnal dyspnea or shortness of breath Dermatological: negative for rash Respiratory: negative for cough or wheezing Urologic: negative for hematuria Abdominal: negative for nausea, vomiting, diarrhea, bright red blood per rectum, melena, or hematemesis Neurologic: negative for visual changes, syncope, or dizziness All other systems reviewed and are otherwise negative except as noted above.    Blood pressure (!) 148/81, pulse (!) 58, height '5\' 7"'$  (1.702 m), weight 229 lb 3.2 oz (104 kg), SpO2 100 %.  General appearance: alert and no distress Neck: no adenopathy, no carotid bruit, no JVD, supple, symmetrical, trachea midline, and thyroid not enlarged, symmetric, no tenderness/mass/nodules Lungs: clear to auscultation bilaterally Heart: regular rate and rhythm, S1, S2 normal, no murmur, click, rub or gallop Extremities: extremities normal, atraumatic, no cyanosis or edema Pulses: 2+ and symmetric Skin: Skin color, texture, turgor normal. No rashes or lesions Neurologic: Grossly normal  EKG  not performed today  ASSESSMENT AND PLAN:   PAD (peripheral artery disease) [facility]) Mr. [name] had bilateral SFA intervention by myself in March and April 2021.  He just currently denies claudication.  He does continue to smoke.  His most recent Doppler studies performed 05/21/2021 revealed ABIs greater than 1 bilaterally with in-stent restenosis within his left SFA stent.  We will continue to monitor him noninvasively on an annual basis.     Lorretta Harp MD FACP,FACC,FAHA, [facility] 02/10/2022 9:08 AM

## 2022-02-11 ENCOUNTER — Ambulatory Visit
Admission: RE | Admit: 2022-02-11 | Discharge: 2022-02-11 | Disposition: A | Discharge status: Home / Self Care | Payer: Medicaid Other | Source: Ambulatory Visit | Attending: Physician Assistant | Admitting: Physician Assistant

## 2022-02-11 ENCOUNTER — Other Ambulatory Visit: Payer: Self-pay | Admitting: Podiatry

## 2022-02-11 DIAGNOSIS — Z79899 Other long term (current) drug therapy: Secondary | ICD-10-CM

## 2022-02-11 DIAGNOSIS — M5010 Cervical disc disorder with radiculopathy, unspecified cervical region: Secondary | ICD-10-CM

## 2022-02-11 MED ORDER — IOPAMIDOL (ISOVUE-M 300) INJECTION 61%
1.0000 mL | Freq: Once | INTRAMUSCULAR | Status: AC
  Administered 2022-02-11: 1 mL via EPIDURAL

## 2022-02-11 MED ORDER — TRIAMCINOLONE ACETONIDE 40 MG/ML IJ SUSP (RADIOLOGY)
60.0000 mg | Freq: Once | INTRAMUSCULAR | Status: AC
  Administered 2022-02-11: 60 mg via EPIDURAL

## 2022-02-11 NOTE — Discharge Instructions (Signed)

## 2022-02-11 NOTE — Telephone Encounter (Signed)
I called patient regarding his refill request. He states the nails are better but still having discoloration and would like to continue Lamisil. Before extending, I am going to recheck blood work. Order placed, patient aware.   Denny Peon, can you e-mail him or something the address for [facility]?   Thanks!

## 2022-02-12 NOTE — Progress Notes (Signed)
Lipoprotein a is an independent risk factor for ASCVD and is recommended to be checked at least once in at risk patients.

## 2022-03-06 LAB — COMPLETE METABOLIC PANEL WITH GFR
AG Ratio: 1.7 (calc) (ref 1.0–2.5)
ALT: 33 U/L (ref 9–46)
AST: 28 U/L (ref 10–35)
Albumin: 4 g/dL (ref 3.6–5.1)
Alkaline phosphatase (APISO): 70 U/L (ref 35–144)
BUN: 21 mg/dL (ref 7–25)
CO2: 27 mmol/L (ref 20–32)
Calcium: 9.1 mg/dL (ref 8.6–10.3)
Chloride: 102 mmol/L (ref 98–110)
Creat: 1.09 mg/dL (ref 0.70–1.30)
Globulin: 2.4 g/dL (calc) (ref 1.9–3.7)
Glucose, Bld: 90 mg/dL (ref 65–99)
Potassium: 4.3 mmol/L (ref 3.5–5.3)
Sodium: 139 mmol/L (ref 135–146)
Total Bilirubin: 0.3 mg/dL (ref 0.2–1.2)
Total Protein: 6.4 g/dL (ref 6.1–8.1)
eGFR: 80 mL/min/{1.73_m2} (ref >=60)

## 2022-03-06 LAB — CBC WITH DIFFERENTIAL/PLATELET
Absolute Monocytes: 689 cells/uL (ref 200–950)
Basophils Absolute: 21 cells/uL (ref 0–200)
Basophils Relative: 0.4 %
Eosinophils Absolute: 58 cells/uL (ref 15–500)
Eosinophils Relative: 1.1 %
HCT: 44.8 % (ref 38.5–50.0)
Hemoglobin: 14.9 g/dL (ref 13.2–17.1)
Lymphs Abs: 2226 cells/uL (ref 850–3900)
MCH: 28 pg (ref 27.0–33.0)
MCHC: 33.3 g/dL (ref 32.0–36.0)
MCV: 84.2 fL (ref 80.0–100.0)
MPV: 9.5 fL (ref 7.5–12.5)
Monocytes Relative: 13 %
Neutro Abs: 2306 cells/uL (ref 1500–7800)
Neutrophils Relative %: 43.5 %
Platelets: 156 10*3/uL (ref 140–400)
RBC: 5.32 10*6/uL (ref 4.20–5.80)
RDW: 14.5 % (ref 11.0–15.0)
Total Lymphocyte: 42 %
WBC: 5.3 10*3/uL (ref 3.8–10.8)

## 2022-03-10 ENCOUNTER — Other Ambulatory Visit: Payer: Self-pay | Admitting: Podiatry

## 2022-03-10 MED ORDER — TERBINAFINE HCL 250 MG PO TABS
250.0000 mg | ORAL_TABLET | Freq: Every day | ORAL | 0 refills | Status: DC
Start: 2022-03-10 — End: 2022-09-02

## 2022-03-11 ENCOUNTER — Encounter: Payer: Self-pay | Admitting: Nurse Practitioner

## 2022-03-11 ENCOUNTER — Ambulatory Visit (INDEPENDENT_AMBULATORY_CARE_PROVIDER_SITE_OTHER): Payer: Medicaid Other | Admitting: Nurse Practitioner

## 2022-03-11 VITALS — BP 134/78 | HR 67 | Temp 98.1°F | Ht 67.0 in | Wt 227.0 lb

## 2022-03-11 DIAGNOSIS — K047 Periapical abscess without sinus: Secondary | ICD-10-CM | POA: Diagnosis not present

## 2022-03-11 MED ORDER — AMOXICILLIN-POT CLAVULANATE 875-125 MG PO TABS: 1.0000 | ORAL_TABLET | Freq: Two times a day (BID) | ORAL | 0 refills | Status: AC

## 2022-03-11 MED ORDER — PREDNISONE 20 MG PO TABS: 20.0000 mg | ORAL_TABLET | Freq: Every day | ORAL | 0 refills | Status: AC

## 2022-03-11 NOTE — Progress Notes (Signed)
$'@Patient'Z$  ID: Jeffrey Winters, male    DOB: [birth year], 56 y.o.   MRN: [id number]  Chief Complaint  Patient presents with   Facial Swelling    Right side, x1w, denies fever, does have dental pain, hx of tooth extraction in same area    Referring provider: Fenton Foy, NP   HPI  Patient presents today for right-sided neck swelling and pain.  Patient did have teeth extracted 3 months ago.  He states intermittent swelling, pain to this area since that time.  Patient will need to be reevaluated by dentist. A list of local dentists were given to patient today. Denies f/c/s, n/v/d, hemoptysis, PND, leg swelling Denies chest pain or edema     No Known Allergies   There is no immunization history on file for this patient.  Past Medical History:  Diagnosis Date   Coronary artery disease 2017   MI s/p PCI to OM2 // s/p MI 07/2019 >> DES to RCA; residual RPL2 80 // Myoview 08/2019: EF 61, small area of mod ischemia inf base; Low Risk   Hypertension    MI (myocardial infarction) ([place])    PAD (peripheral artery disease) ([facility])    LE Art [place] 08/2019: R SFA 50-74; L SFA 50-74    Tobacco History: Social History   Tobacco Use  Smoking Status Every Day   Packs/day: 1.00   Years: 7.00   Total pack years: 7.00   Types: Cigarettes  Smokeless Tobacco Never  Tobacco Comments   smokes 1/2 pack per day 06/19/2020   Ready to quit: Not Answered Counseling given: Not Answered Tobacco comments: smokes 1/2 pack per day 06/19/2020   Outpatient Encounter Medications as of 03/11/2022  Medication Sig   albuterol (VENTOLIN HFA) 108 (90 Base) MCG/ACT inhaler INHALE 1-2 PUFFS INTO THE LUNGS EVERY 6 (SIX) HOURS AS NEEDED FOR WHEEZING OR SHORTNESS OF BREATH.   ammonium lactate (AMLACTIN) 12 % lotion Apply 1 application topically as needed for dry skin.   amoxicillin-clavulanate (AUGMENTIN) 875-125 MG tablet Take 1 tablet by mouth 2 (two) times daily for 10 days.   aspirin EC 81 MG tablet Take 1 tablet  orally daily   atorvastatin (LIPITOR) 80 MG tablet Take 1 tablet (80 mg total) by mouth daily.   buPROPion (WELLBUTRIN XL) 150 MG 24 hr tablet Take 1 tablet by mouth at bedtime   clopidogrel (PLAVIX) 75 MG tablet Take 1 tablet (75 mg total) by mouth daily.   escitalopram (LEXAPRO) 10 MG tablet Take 10 mg by mouth daily.   famotidine (PEPCID) 20 MG tablet Take 1 tablet by mouth 2 times daily   isosorbide mononitrate (IMDUR) 60 MG 24 hr tablet Take 1 tablet (60 mg total) by mouth daily.   metoprolol succinate (TOPROL-XL) 25 MG 24 hr tablet Take 1 tablet by mouth daily, may take one additional tablet as needed for palpitations   nitroGLYCERIN (NITROSTAT) 0.4 MG SL tablet Take 1 tablet sublingually every 5 minutes as needed for chest pain   predniSONE (DELTASONE) 20 MG tablet Take 1 tablet (20 mg total) by mouth daily with breakfast for 5 days.   terbinafine (LAMISIL) 250 MG tablet Take 1 tablet (250 mg total) by mouth daily.   umeclidinium-vilanterol (ANORO ELLIPTA) 62.5-25 MCG/ACT AEPB Inhale 1 puff into the lungs daily at 6 (six) AM.   [DISCONTINUED] HYDROcodone-acetaminophen (NORCO/VICODIN) 5-325 MG tablet Take 1 tablet by mouth every 4 (four) hours as needed for moderate pain.   [DISCONTINUED] meloxicam (MOBIC) 7.5 MG tablet Take  1 tablet (7.5 mg total) by mouth daily.   [DISCONTINUED] methocarbamol (ROBAXIN) 500 MG tablet Take 1 tablet (500 mg total) by mouth 4 (four) times daily.   No facility-administered encounter medications on file as of 03/11/2022.     Review of Systems  Review of Systems  Constitutional: Negative.   HENT: Negative.    Cardiovascular: Negative.   Gastrointestinal: Negative.   Allergic/Immunologic: Negative.   Neurological: Negative.   Psychiatric/Behavioral: Negative.         Physical Exam  BP 134/78   Pulse 67   Temp 98.1 F (36.7 C) (Temporal)   Ht '5\' 7"'$  (1.702 m)   Wt 227 lb (103 kg)   SpO2 98%   BMI 35.55 kg/m   Wt Readings from Last 5  Encounters:  03/11/22 227 lb (103 kg)  02/10/22 229 lb 3.2 oz (104 kg)  01/08/22 228 lb 6.4 oz (103.6 kg)  12/31/21 230 lb 12.8 oz (104.7 kg)  12/26/21 230 lb 2 oz (104.4 kg)     Physical Exam Vitals and nursing note reviewed.  Constitutional:      General: He is not in acute distress.    Appearance: He is well-developed.  Cardiovascular:     Rate and Rhythm: Normal rate and regular rhythm.  Pulmonary:     Effort: Pulmonary effort is normal.     Breath sounds: Normal breath sounds.  Skin:    General: Skin is warm and dry.  Neurological:     Mental Status: He is alert and oriented to person, place, and time.      Assessment & Plan:   Dental abscess - amoxicillin-clavulanate (AUGMENTIN) 875-125 MG tablet; Take 1 tablet by mouth 2 (two) times daily for 10 days.  Dispense: 20 tablet; Refill: 0 - predniSONE (DELTASONE) 20 MG tablet; Take 1 tablet (20 mg total) by mouth daily with breakfast for 5 days.  Dispense: 5 tablet; Refill: 0   - warm salt water gargles  Follow up:  Follow up as scheduled     Fenton Foy, NP 03/11/2022

## 2022-03-11 NOTE — Patient Instructions (Addendum)
1. Dental abscess  - amoxicillin-clavulanate (AUGMENTIN) 875-125 MG tablet; Take 1 tablet by mouth 2 (two) times daily for 10 days.  Dispense: 20 tablet; Refill: 0 - predniSONE (DELTASONE) 20 MG tablet; Take 1 tablet (20 mg total) by mouth daily with breakfast for 5 days.  Dispense: 5 tablet; Refill: 0   - warm salt water gargles  Follow up:  Follow up as scheduled  Dental Abscess  A dental abscess is an area of pus in or around a tooth. It comes from an infection. It can cause pain and other symptoms. Treatment will help with symptoms and prevent the infection from spreading. What are the causes? This condition is caused by an infection in or around the tooth. This can be from: Very bad tooth decay (cavities). A bad injury to the tooth, such as a broken or chipped tooth. What increases the risk? The risk to get an abscess is higher in males. It is also more likely in people who: Have dental decay. Have very bad gum disease. Eat sugary snacks between meals. Use tobacco. Have diabetes. Have a weak disease-fighting system (immune system). Do not brush their teeth regularly. What are the signs or symptoms? Some mild symptoms are: Tenderness. Bad breath. Fever. A sharp, sour taste in the mouth. Pain in and around the infected tooth. Worse symptoms of this condition include: Swollen neck glands. Chills. Pus draining around the tooth. Swelling and redness around the tooth, the mouth, or the face. Very bad pain in and around the tooth. The worst symptoms can include: Difficulty swallowing. Difficulty opening your mouth. Feeling like you may vomit or vomiting. How is this treated? This is treated by getting rid of the infection. Your dentist will discuss ways to do this, including: Antibiotic medicines. Antibacterial mouth rinse. An incision in the abscess to drain out the pus. A root canal. Removing the tooth. Follow these instructions at home: Medicines Take  over-the-counter and prescription medicines only as told by your dentist. If you were prescribed an antibiotic medicine, take it as told by your dentist. Do not stop taking it even if you start to feel better. If you were prescribed a gel that has numbing medicine in it, use it exactly as told. Ask your dentist if you should avoid driving or using machines while you are taking your medicine. General instructions Rinse your mouth often with salt water. To make salt water, dissolve -1 tsp (3-6 g) of salt in 1 cup (237 mL) of warm water. Eat a soft diet while your mouth is healing. Drink enough fluid to keep your pee (urine) pale yellow. Do not apply heat to the outside of your mouth. Do not smoke or use any products that contain nicotine or tobacco. If you need help quitting, ask your dentist. Keep all follow-up visits. Prevent an abscess Brush your teeth every morning and every night. Use fluoride toothpaste. Floss your teeth each day. Get dental cleanings as often as told by your dentist. Think about getting dental sealant put on teeth that have deep holes (decay). Drink water that has fluoride in it. Most tap water has fluoride. Check the label on bottled water to see if it has fluoride in it. Drink water instead of sugary drinks. Eat healthy meals and snacks. Wear a mouth guard or face shield when you play sports. Contact a doctor if: Your pain is worse and medicine does not help. Get help right away if: You have a fever or chills. Your symptoms suddenly get worse.  You have a very bad headache. You have problems breathing or swallowing. You have trouble opening your mouth. You have swelling in your neck or close to your eye. These symptoms may be an emergency. Get help right away. Call your local emergency services (911 in the [place] Do not wait to see if the symptoms will go away. Do not drive yourself to the hospital. Summary A dental abscess is an area of pus in or around a  tooth. It is caused by an infection. Treatment will help with symptoms and prevent the infection from spreading. Take over-the-counter and prescription medicines only as told by your dentist. To prevent an abscess, take good care of your teeth. Brush your teeth every morning and night. Use floss every day. Get dental cleanings as often as told by your dentist. This information is not intended to replace advice given to you by your health care provider. Make sure you discuss any questions you have with your health care provider. Document Revised: 09/26/2020 Document Reviewed: 09/26/2020 Elsevier Patient Education  [place].

## 2022-03-11 NOTE — Assessment & Plan Note (Signed)
-   amoxicillin-clavulanate (AUGMENTIN) 875-125 MG tablet; Take 1 tablet by mouth 2 (two) times daily for 10 days.  Dispense: 20 tablet; Refill: 0 - predniSONE (DELTASONE) 20 MG tablet; Take 1 tablet (20 mg total) by mouth daily with breakfast for 5 days.  Dispense: 5 tablet; Refill: 0   - warm salt water gargles  Follow up:  Follow up as scheduled

## 2022-03-12 ENCOUNTER — Other Ambulatory Visit: Payer: Medicaid Other

## 2022-03-12 DIAGNOSIS — E782 Mixed hyperlipidemia: Secondary | ICD-10-CM

## 2022-03-12 LAB — HEPATIC FUNCTION PANEL
ALT: 26 IU/L (ref 0–44)
AST: 25 IU/L (ref 0–40)
Albumin: 3.9 g/dL (ref 3.8–4.9)
Alkaline Phosphatase: 75 IU/L (ref 44–121)
Bilirubin Total: 0.2 mg/dL (ref 0.0–1.2)
Bilirubin, Direct: <0.1 mg/dL (ref 0.00–0.40)
Total Protein: 6.2 g/dL (ref 6.0–8.5)

## 2022-03-12 LAB — LIPID PANEL
Chol/HDL Ratio: 2.7 ratio (ref 0.0–5.0)
Cholesterol, Total: 122 mg/dL (ref 100–199)
HDL: 45 mg/dL (ref >39)
LDL Chol Calc (NIH): 50 mg/dL (ref 0–99)
Triglycerides: 162 mg/dL — ABNORMAL HIGH (ref 0–149)
VLDL Cholesterol Cal: 27 mg/dL (ref 5–40)

## 2022-03-13 NOTE — Progress Notes (Signed)
Called patient and given lab results, verbalized understanding

## 2022-03-27 ENCOUNTER — Other Ambulatory Visit: Payer: Self-pay | Admitting: Physician Assistant

## 2022-03-27 ENCOUNTER — Other Ambulatory Visit: Payer: Self-pay | Admitting: Podiatry

## 2022-03-27 DIAGNOSIS — E782 Mixed hyperlipidemia: Secondary | ICD-10-CM

## 2022-03-27 DIAGNOSIS — I10 Essential (primary) hypertension: Secondary | ICD-10-CM

## 2022-03-27 DIAGNOSIS — I2510 Atherosclerotic heart disease of native coronary artery without angina pectoris: Secondary | ICD-10-CM

## 2022-04-09 ENCOUNTER — Ambulatory Visit: Payer: Medicaid Other | Admitting: Podiatry

## 2022-04-09 DIAGNOSIS — B351 Tinea unguium: Secondary | ICD-10-CM | POA: Diagnosis not present

## 2022-04-09 DIAGNOSIS — I739 Peripheral vascular disease, unspecified: Secondary | ICD-10-CM | POA: Diagnosis not present

## 2022-04-09 DIAGNOSIS — Z79899 Other long term (current) drug therapy: Secondary | ICD-10-CM

## 2022-04-09 DIAGNOSIS — D492 Neoplasm of unspecified behavior of bone, soft tissue, and skin: Secondary | ICD-10-CM

## 2022-04-09 MED ORDER — TERBINAFINE HCL 250 MG PO TABS: 250.0000 mg | ORAL_TABLET | Freq: Every day | ORAL | 0 refills | Status: DC

## 2022-04-09 NOTE — Patient Instructions (Addendum)
GO AHEAD AND START THE LAMISIL. IN ABOUT 4 WEEKS, OR RIGHT BEFORE YOU FINISH THE MEDICATION, GO BY THE SAME LAB TO GET UPDATED BLOOD WORK.   Take dressing off in 8 hours and wash the foot with soap and water. If it is hurting or becomes uncomfortable before the 8 hours, go ahead and remove the bandage and wash the area.  If it blisters, apply antibiotic ointment and a band-aid.  Monitor for any signs/symptoms of infection. Call the office immediately if any occur or go directly to the emergency room. Call with any questions/concerns.

## 2022-04-12 NOTE — Progress Notes (Signed)
Subjective: 56 year old male presents the office today for evaluation of skin lesion, wart on the right foot as well as for nail fungus.  States he did not continue Lamisil.  He has not had any side effects.  The skin lesions came back after couple weeks causing discomfort again.  No swelling redness or any drainage.  History of stents in legs. ABI in the right 1.03, TBI 0.85 on the left ABI 0.8, TBI 8.72.  Monophasic waveforms on the left side.  Objective: AAO x3, NAD DP pulse 2/4, PT pulse 1/4.   Nails appear to be unchanged. They remain hypertrophic, dystrophic with yellow discoloration.  There is no pain in the nails there is no swelling or redness or any signs of infection. Hyperkeratotic lesion present on the plantar aspect of the right foot.  This has been ongoing for quite some time.  Upon review there is no ulceration noted but there is thick callus tissue and there is hyperpigmentation noted to it today but is uniform in color. No open lesions or pre-ulcerative lesions.  No pain with calf compression, swelling, warmth, erythema  Assessment: Skin lesion left foot, onychomycosis  Plan: -All treatment options discussed with the patient including all alternatives, risks, complications.  -Sharply debrided the nails x 10 without any complications or bleeding.  He is going to go ahead and restart the Lamisil.  I gave him updated blood work to recheck in 4 weeks for CBC and LFT.  Continue to monitor any side effects. -Sharply debrided the hyperkeratotic lesions to any complications on the plantar right foot.  ABI is actually normal on the right side.  I cleaned the area and Cantharone Plus was applied followed by an occlusive bandage.  Postprocedure instructions discussed.  Monitor for any signs or symptoms of infection.  If no improvement consider surgical excision.  Trula Slade DPM

## 2022-05-04 ENCOUNTER — Encounter (HOSPITAL_COMMUNITY): Payer: Medicaid Other

## 2022-05-06 ENCOUNTER — Ambulatory Visit (HOSPITAL_COMMUNITY)
Admission: RE | Admit: 2022-05-06 | Discharge: 2022-05-06 | Disposition: A | Discharge status: Home / Self Care | Payer: Medicaid Other | Source: Ambulatory Visit | Attending: Cardiovascular Disease | Admitting: Cardiovascular Disease

## 2022-05-06 DIAGNOSIS — I739 Peripheral vascular disease, unspecified: Secondary | ICD-10-CM | POA: Diagnosis not present

## 2022-05-07 LAB — CBC WITH DIFFERENTIAL/PLATELET
Absolute Monocytes: 546 cells/uL (ref 200–950)
Basophils Absolute: 22 cells/uL (ref 0–200)
Basophils Relative: 0.5 %
Eosinophils Absolute: 40 cells/uL (ref 15–500)
Eosinophils Relative: 0.9 %
HCT: 45.2 % (ref 38.5–50.0)
Hemoglobin: 14.9 g/dL (ref 13.2–17.1)
Lymphs Abs: 2323 cells/uL (ref 850–3900)
MCH: 28.5 pg (ref 27.0–33.0)
MCHC: 33 g/dL (ref 32.0–36.0)
MCV: 86.4 fL (ref 80.0–100.0)
MPV: 9.9 fL (ref 7.5–12.5)
Monocytes Relative: 12.4 %
Neutro Abs: 1470 cells/uL — ABNORMAL LOW (ref 1500–7800)
Neutrophils Relative %: 33.4 %
Platelets: 159 10*3/uL (ref 140–400)
RBC: 5.23 10*6/uL (ref 4.20–5.80)
RDW: 14.1 % (ref 11.0–15.0)
Total Lymphocyte: 52.8 %
WBC: 4.4 10*3/uL (ref 3.8–10.8)

## 2022-05-07 LAB — HEPATIC FUNCTION PANEL
AG Ratio: 1.6 (calc) (ref 1.0–2.5)
ALT: 25 U/L (ref 9–46)
AST: 24 U/L (ref 10–35)
Albumin: 4.1 g/dL (ref 3.6–5.1)
Alkaline phosphatase (APISO): 69 U/L (ref 35–144)
Bilirubin, Direct: 0.1 mg/dL (ref 0.0–0.2)
Globulin: 2.5 g/dL (calc) (ref 1.9–3.7)
Indirect Bilirubin: 0.2 mg/dL (calc) (ref 0.2–1.2)
Total Bilirubin: 0.3 mg/dL (ref 0.2–1.2)
Total Protein: 6.6 g/dL (ref 6.1–8.1)

## 2022-05-12 ENCOUNTER — Other Ambulatory Visit: Payer: Self-pay | Admitting: Podiatry

## 2022-05-13 ENCOUNTER — Other Ambulatory Visit: Payer: Self-pay | Admitting: Podiatry

## 2022-05-13 DIAGNOSIS — D708 Other neutropenia: Secondary | ICD-10-CM
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
## 2022-05-20 ENCOUNTER — Encounter: Payer: Self-pay | Admitting: Physician Assistant

## 2022-05-20 ENCOUNTER — Ambulatory Visit: Payer: Medicaid Other | Attending: Physician Assistant | Admitting: Physician Assistant

## 2022-05-20 VITALS — BP 130/60 | HR 64 | Ht 68.0 in | Wt 231.0 lb

## 2022-05-20 DIAGNOSIS — E785 Hyperlipidemia, unspecified: Secondary | ICD-10-CM

## 2022-05-20 DIAGNOSIS — I739 Peripheral vascular disease, unspecified: Secondary | ICD-10-CM | POA: Diagnosis not present

## 2022-05-20 DIAGNOSIS — Z720 Tobacco use: Secondary | ICD-10-CM

## 2022-05-20 DIAGNOSIS — R079 Chest pain, unspecified: Secondary | ICD-10-CM

## 2022-05-20 DIAGNOSIS — I10 Essential (primary) hypertension: Secondary | ICD-10-CM

## 2022-05-20 DIAGNOSIS — I25119 Atherosclerotic heart disease of native coronary artery with unspecified angina pectoris: Secondary | ICD-10-CM

## 2022-05-20 MED ORDER — AMLODIPINE BESYLATE 2.5 MG PO TABS: 2.5000 mg | ORAL_TABLET | Freq: Every day | ORAL | 3 refills | Status: DC

## 2022-05-20 MED ORDER — NITROGLYCERIN 0.4 MG SL SUBL: SUBLINGUAL_TABLET | SUBLINGUAL | 1 refills | Status: AC

## 2022-05-20 NOTE — Assessment & Plan Note (Signed)
LDL optimal. Continue Lipitor 80 mg once daily.

## 2022-05-20 NOTE — Assessment & Plan Note (Signed)
I have recommended cessation. I have advised him of the dangers of ongoing tobacco use including but not limited to recurrent myocardial infarctions.

## 2022-05-20 NOTE — Patient Instructions (Signed)
Medication Instructions:  Your physician has recommended you make the following change in your medication:   START Amlodipine 2.5 mg taking 1 daily   *If you need a refill on your cardiac medications before your next appointment, please call your pharmacy*   Lab Work: None ordered  If you have labs (blood work) drawn today and your tests are completely normal, you will receive your results only by: [place] (if you have MyChart) OR A paper copy in the mail If you have any lab test that is abnormal or we need to change your treatment, we will call you to review the results.   Testing/Procedures: Your physician has requested that you have an Limited echocardiogram. Echocardiography is a painless test that uses sound waves to create images of your heart. It provides your doctor with information about the size and shape of your heart and how well your heart's chambers and valves are working. This procedure takes approximately one hour. There are no restrictions for this procedure. Please do NOT wear cologne, perfume, aftershave, or lotions (deodorant is allowed). Please arrive 15 minutes prior to your appointment time.    Follow-Up: At [facility], you and your health needs are our priority.  As part of our continuing mission to provide you with exceptional heart care, we have created designated Provider Care Teams.  These Care Teams include your primary Cardiologist (physician) and Advanced Practice Providers (APPs -  Physician Assistants and Nurse Practitioners) who all work together to provide you with the care you need, when you need it.  We recommend signing up for the patient portal called "MyChart".  Sign up information is provided on this After Visit Summary.  MyChart is used to connect with patients for Virtual Visits (Telemedicine).  Patients are able to view lab/test results, encounter notes, upcoming appointments, etc.  Non-urgent messages can be sent to your  provider as well.   To learn more about what you can do with MyChart, go to [email or web address].    Your next appointment:   6-8 week(s)  The format for your next appointment:   In Person  Provider:   Sherren Mocha, MD  or [name], PA-C         Other Instructions   Important Information About Sugar

## 2022-05-20 NOTE — Assessment & Plan Note (Signed)
History of non-STEMI 02/2016 treated a DES to OM1.  He had another non-STEMI in 05/2016 that was managed medically. He had an inf STEMI in 07/2019 tx with DES to RCA. Myoview in 08/2019 was low risk. He had a cath 5 mos ago that demonstrated patent stents in the RCA and OM1. He has residual 80% stenosis in the RPLA that has remained unchanged and managed medically. It was noted at his last cath that the RPLA could be approached with PCI if the pt continues to have refractory angina. He has continued to have chest pain with more mod to extreme exertion (CCS class II). He had a worse episode this past weekend. His EKG today does not demonstrate any new changes. I have recommended that we intensify GDMT for management of his angina. If he continues to have symptoms despite max tolerated antianginal Rx, we can consider relook cath with an eye towards PCI of his RPLA. Continue ASA 81 mg once daily, Lipitor 80 mg once daily, Plavix 75 mg once daily, Imdur 60 mg once daily, Toprol XL 25 mg once daily. Start Amlodipine 2.5 mg once daily Check limited echocardiogram to look for new WMAs or drop in EF given prolonged symptoms this past weekend F/u 6-8 weeks.

## 2022-05-20 NOTE — Assessment & Plan Note (Signed)
Continue f/u with Dr. Gwenlyn Found as planned.

## 2022-05-20 NOTE — Assessment & Plan Note (Signed)
BP is controlled. Continue Imdur 60 mg once daily, Toprol XL 25 mg once daily. Add Amlodipine for management of angina as noted.

## 2022-06-02 ENCOUNTER — Ambulatory Visit: Payer: Medicaid Other | Attending: Cardiovascular Disease | Admitting: Cardiovascular Disease

## 2022-06-02 ENCOUNTER — Encounter: Payer: Self-pay | Admitting: Cardiovascular Disease

## 2022-06-02 VITALS — BP 128/74 | HR 74 | Ht 68.0 in | Wt 231.0 lb

## 2022-06-02 DIAGNOSIS — I739 Peripheral vascular disease, unspecified: Secondary | ICD-10-CM

## 2022-06-02 NOTE — Assessment & Plan Note (Signed)
Mr. Jeffrey Winters returns today for follow-up of his PAD.  He originally was sent to me by [name], PA-C.  I performed left SFA intervention with stenting 10/05/2019 with staged right SFA intervention/8/21 using shockwave angioplasty and DCB.  He has three-vessel runoff bilaterally.  He is complains of some numbness in his legs and tingling but denies claudication.  His most recent Doppler studies performed 10//23 revealed a right ABI of 1.05 and a left of 0.90.  He had mild elevation velocities in his distal right SFA and moderately severe elevation of the velocities within his left SFA stent.  We will continue to monitor him noninvasively on annual basis.  Should he have recurrent symptoms he would be a candidate for intervention.

## 2022-06-02 NOTE — Progress Notes (Signed)
06/02/2022 Jeffrey Winters   06-25-1966  [id number]  Primary Physician Fenton Foy, NP Primary Cardiologist: Lorretta Harp MD FACP, [place], [place], [place]  HPI:  Jeffrey Winters is a 56 y.o.   mild to moderately overweight single African-American male with no children referred to me by Jeffrey Dopp, PA-C for treatment of symptomatic PAD.  He does not have a primary care provider.  I last saw him in the office 02/10/2022.  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  I last saw him in the office 08/29/2019. His risk factors include 20 to 30 pack years of tobacco abuse continue to smoke 1 pack/day, treated hypertension hyperlipidemia.  There is no family history of heart disease.  He is never had a stroke.  He had a non-STEMI in December and had treatment of his distal RCA.  He had stenting of his distal RCA and obtuse marginal branch at [facility] 02/26/2016 as well.  He does complain of left Solomon claudication at 50 to 75 feet which is symmetric bilaterally.  He had Doppler studies performed 08/09/2019 revealing normal ABIs with moderate disease in both SFAs.   I performed peripheral angiography on him 10/05/2019 revealing high-grade fairly focal mid bilateral SFA stenoses.  I performed [facility] 1 directional atherectomy followed by drug-coated balloon angioplasty of the mid left SFA with an excellent result.  His Dopplers normalized and his left calf claudication has resolved.    I performed right SFA intervention on him 11/09/2019 using intravascular lithotripsy followed by drug-coated balloon angioplasty.  His follow-up Doppler studies performed 11/16/2019 showed no change in the frequency mid right SFA or change in his ABIs although clinically he is improved.  Unfortunately, he does continue to smoke a half a pack a day.   Since I saw him 6 months ago, he continues to do well.  He is still denies claudication chest pain or shortness of breath.   Dopplers performed 10//23 revealed a right ABI of 1.05 and a left of 0.90.  He does have mild increase in velocities in his distal right SFA and moderately severe increase in velocities in his left SFA stent which represents progression since his last study a year ago although he continues to deny claudication.  He does complain of some "tingling in his legs".  Current Meds  Medication Sig   albuterol (VENTOLIN HFA) 108 (90 Base) MCG/ACT inhaler INHALE 1-2 PUFFS INTO THE LUNGS EVERY 6 (SIX) HOURS AS NEEDED FOR WHEEZING OR SHORTNESS OF BREATH.   amLODipine (NORVASC) 2.5 MG tablet Take 1 tablet (2.5 mg total) by mouth daily.   ammonium lactate (AMLACTIN) 12 % lotion Apply 1 application topically as needed for dry skin.   aspirin EC 81 MG tablet Take 1 tablet orally daily   atorvastatin (LIPITOR) 80 MG tablet Take 1 tablet (80 mg total) by mouth daily.   buPROPion (WELLBUTRIN XL) 150 MG 24 hr tablet Take 1 tablet by mouth at bedtime   clopidogrel (PLAVIX) 75 MG tablet Take 1 tablet (75 mg total) by mouth daily.   escitalopram (LEXAPRO) 10 MG tablet Take 10 mg by mouth daily.   famotidine (PEPCID) 20 MG tablet Take 1 tablet by mouth 2 times daily   isosorbide mononitrate (IMDUR) 60 MG 24 hr tablet Take 1 tablet (60 mg total) by mouth daily.   metoprolol succinate (TOPROL-XL) 25 MG 24 hr tablet TAKE 1 TABLET BY MOUTH DAILY, MAY TAKE  ONE ADDITIONAL TABLET AS NEEDED FOR PALPITATIONS   nitroGLYCERIN (NITROSTAT) 0.4 MG SL tablet Take 1 tablet sublingually every 5 minutes as needed for chest pain   terbinafine (LAMISIL) 250 MG tablet Take 1 tablet (250 mg total) by mouth daily.     No Known Allergies  Social History   Socioeconomic History   Marital status: Single    Spouse name: Not on file   Number of children: Not on file   Years of education: Not on file   Highest education level: Not on file  Occupational History   Not on file  Tobacco Use   Smoking status: Every Day    Packs/day: 1.00     Years: 7.00    Total pack years: 7.00    Types: Cigarettes   Smokeless tobacco: Never   Tobacco comments:    smokes 1/2 pack per day 06/19/2020  Vaping Use   Vaping Use: Never used  Substance and Sexual Activity   Alcohol use: Not Currently   Drug use: No   Sexual activity: Not Currently  Other Topics Concern   Not on file  Social History Narrative   Not on file   Social Determinants of Health   Financial Resource Strain: Not on file  Food Insecurity: Not on file  Transportation Needs: Not on file  Physical Activity: Not on file  Stress: Not on file  Social Connections: Not on file  Intimate Partner Violence: Not on file     Review of Systems: General: negative for chills, fever, night sweats or weight changes.  Cardiovascular: negative for chest pain, dyspnea on exertion, edema, orthopnea, palpitations, paroxysmal nocturnal dyspnea or shortness of breath Dermatological: negative for rash Respiratory: negative for cough or wheezing Urologic: negative for hematuria Abdominal: negative for nausea, vomiting, diarrhea, bright red blood per rectum, melena, or hematemesis Neurologic: negative for visual changes, syncope, or dizziness All other systems reviewed and are otherwise negative except as noted above.    Blood pressure 128/74, pulse 74, height '5\' 8"'$  (1.727 m), weight 231 lb (104.8 kg).  General appearance: alert and no distress Neck: no adenopathy, no carotid bruit, no JVD, supple, symmetrical, trachea midline, and thyroid not enlarged, symmetric, no tenderness/mass/nodules Lungs: clear to auscultation bilaterally Heart: regular rate and rhythm, S1, S2 normal, no murmur, click, rub or gallop Extremities: extremities normal, atraumatic, no cyanosis or edema Pulses: 2+ and symmetric Skin: Skin color, texture, turgor normal. No rashes or lesions Neurologic: Grossly normal  EKG not performed today  ASSESSMENT AND PLAN:   PAD (peripheral artery disease) ([place]) Mr.  Jeffrey Winters returns today for follow-up of his PAD.  He originally was sent to me by Jeffrey Dopp, PA-C.  I performed left SFA intervention with stenting 10/05/2019 with staged right SFA intervention/8/21 using shockwave angioplasty and DCB.  He has three-vessel runoff bilaterally.  He is complains of some numbness in his legs and tingling but denies claudication.  His most recent Doppler studies performed 10//23 revealed a right ABI of 1.05 and a left of 0.90.  He had mild elevation velocities in his distal right SFA and moderately severe elevation of the velocities within his left SFA stent.  We will continue to monitor him noninvasively on annual basis.  Should he have recurrent symptoms he would be a candidate for intervention.     Lorretta Harp MD FACP,FACC,FAHA, [facility] 06/02/2022 9:41 AM

## 2022-06-02 NOTE — Patient Instructions (Signed)
Medication Instructions:  Your physician recommends that you continue on your current medications as directed. Please refer to the Current Medication list given to you today.  *If you need a refill on your cardiac medications before your next appointment, please call your pharmacy*   Testing/Procedures: Your physician has requested that you have a lower extremity arterial duplex. This test is an ultrasound of the arteries in the legs. It looks at arterial blood flow in the legs. Allow one hour for Lower Arterial scans. There are no restrictions or special instructions To be done in October 2024. This procedure will be done at [place]  Your physician has requested that you have an ankle brachial index (ABI). During this test an ultrasound and blood pressure cuff are used to evaluate the arteries that supply the arms and legs with blood. Allow thirty minutes for this exam. There are no restrictions or special instructions. To be done in October 2024. This procedure will be done at [place]. [address]   Follow-Up: At [facility], you and your health needs are our priority.  As part of our continuing mission to provide you with exceptional heart care, we have created designated Provider Care Teams.  These Care Teams include your primary Cardiologist (physician) and Advanced Practice Providers (APPs -  Physician Assistants and Nurse Practitioners) who all work together to provide you with the care you need, when you need it.  We recommend signing up for the patient portal called "MyChart".  Sign up information is provided on this After Visit Summary.  MyChart is used to connect with patients for Virtual Visits (Telemedicine).  Patients are able to view lab/test results, encounter notes, upcoming appointments, etc.  Non-urgent messages can be sent to your provider as well.   To learn more about what you can do with MyChart, go to [email or web address].    Your next  appointment:   12 month(s)  The format for your next appointment:   In Person  Provider:   Quay Burow, MD

## 2022-06-05 ENCOUNTER — Ambulatory Visit (HOSPITAL_COMMUNITY): Payer: Medicaid Other | Attending: Physician Assistant

## 2022-06-05 DIAGNOSIS — I517 Cardiomegaly: Secondary | ICD-10-CM

## 2022-06-05 DIAGNOSIS — R079 Chest pain, unspecified: Secondary | ICD-10-CM | POA: Insufficient documentation

## 2022-06-05 DIAGNOSIS — I25119 Atherosclerotic heart disease of native coronary artery with unspecified angina pectoris: Secondary | ICD-10-CM | POA: Diagnosis present

## 2022-06-05 LAB — ECHOCARDIOGRAM LIMITED
Area-P 1/2: 3.6 cm2
S' Lateral: 3.3 cm

## 2022-06-05 NOTE — Progress Notes (Signed)
Pt has been made aware of normal result and verbalized understanding.  jw

## 2022-06-10 ENCOUNTER — Other Ambulatory Visit: Payer: Self-pay | Admitting: Podiatry

## 2022-06-29 ENCOUNTER — Ambulatory Visit: Payer: Self-pay | Admitting: Nurse Practitioner

## 2022-07-07 ENCOUNTER — Ambulatory Visit: Payer: Medicaid Other | Admitting: Physician Assistant

## 2022-07-11 IMAGING — DX DG KNEE COMPLETE 4+V*L*
4 series · 4 of 4 positions shown · non-contrast
Comparison: None.

CLINICAL DATA: Pain with swelling

EXAM:
LEFT KNEE - COMPLETE 4+ VIEW

[knee ap]
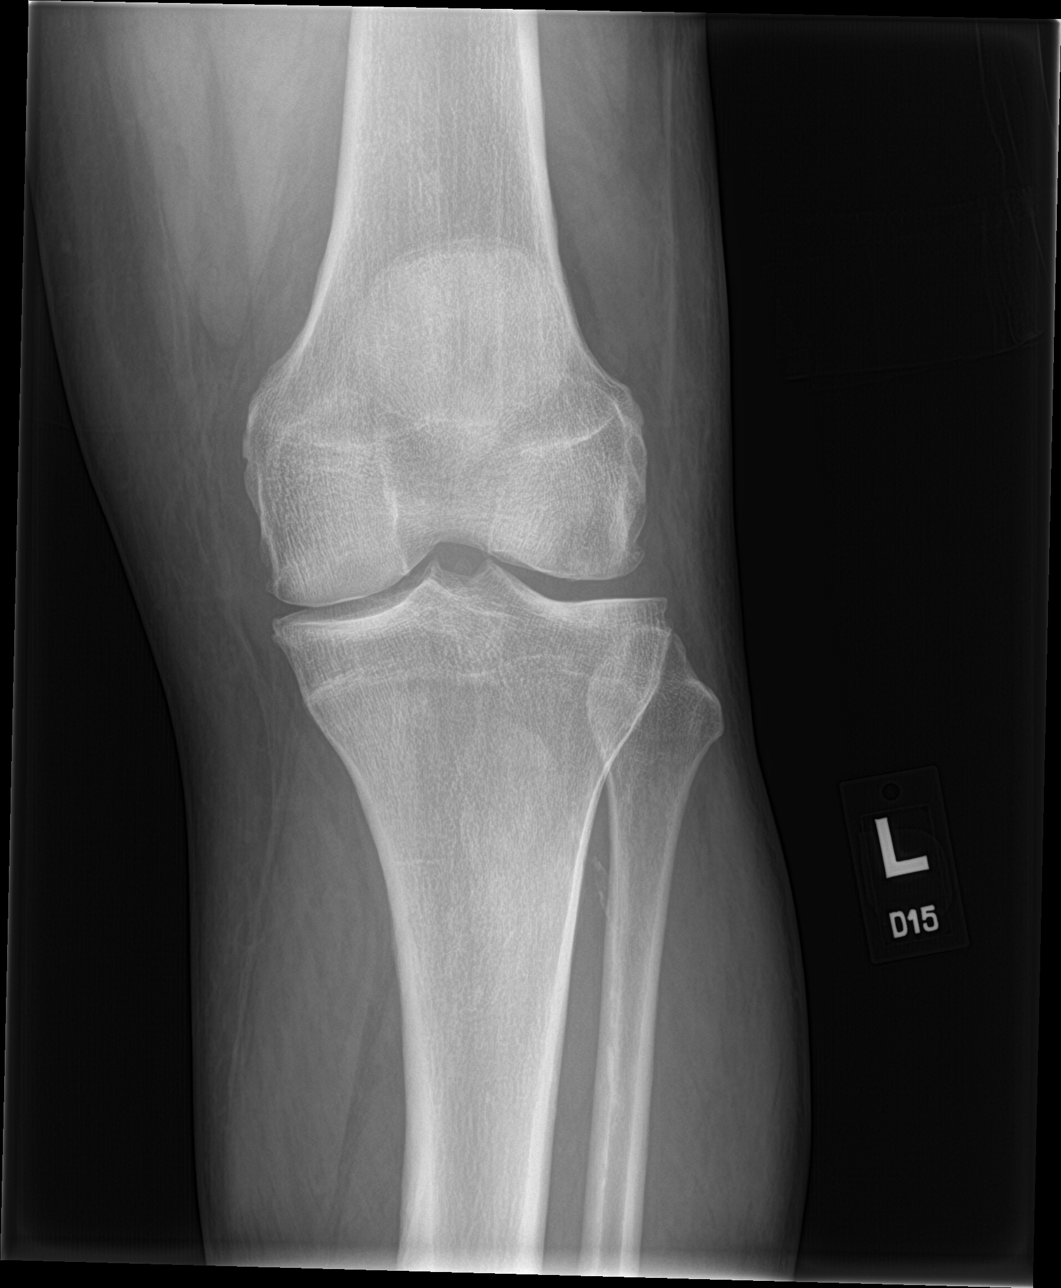

[knee obl (1 of 2)]
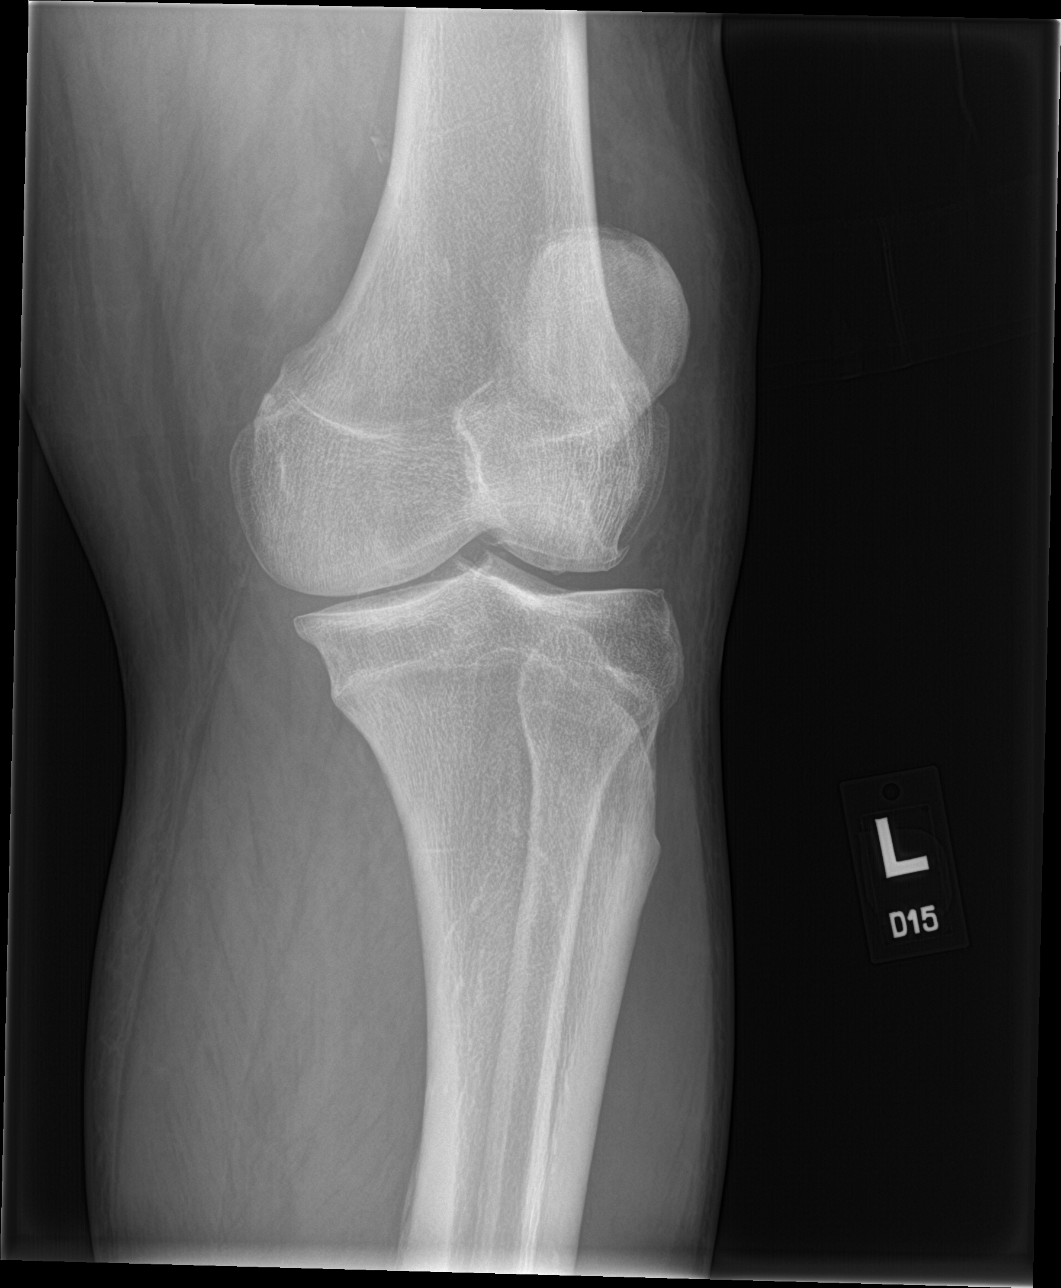

[knee obl (2 of 2)]
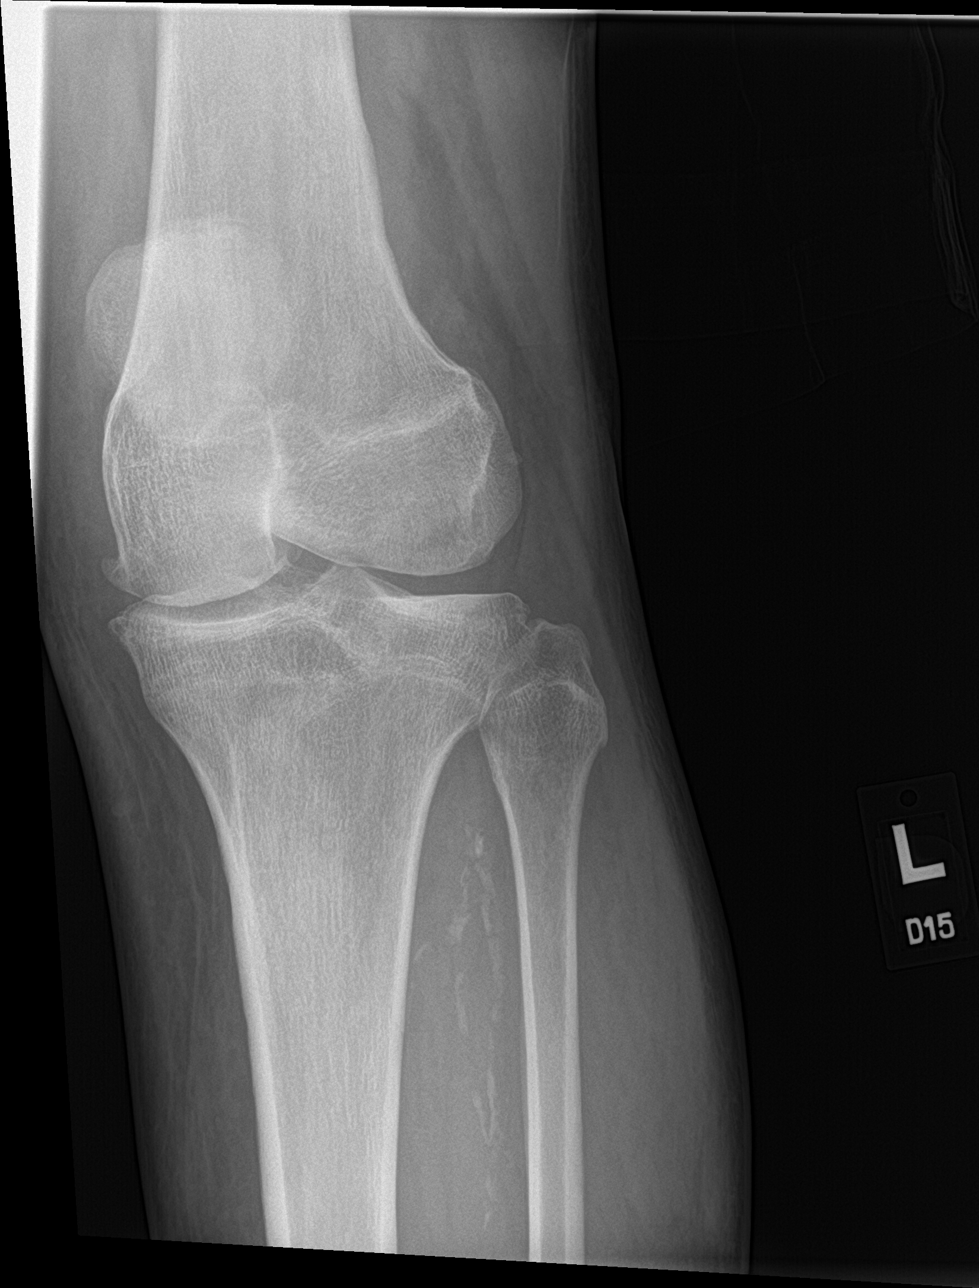

[knee lat]
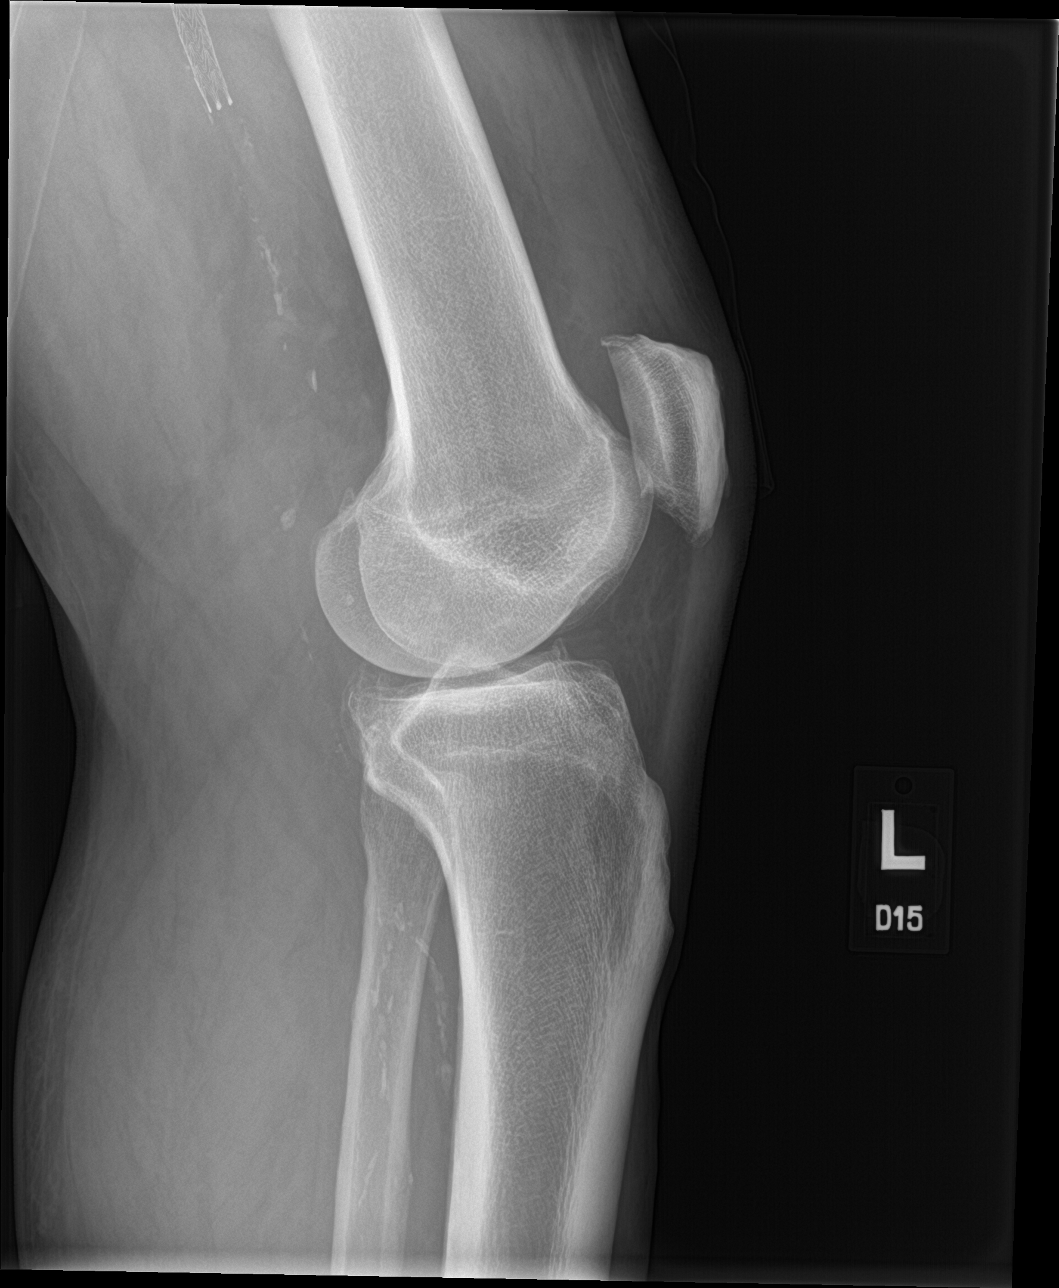

[4 of 4 positions shown; findings below may reference images not displayed]

FINDINGS: Mild tricompartment arthritis. No fracture or malalignment. Probable
trace knee effusion. Vascular calcifications with incompletely
visualized stent in the distal thigh.
IMPRESSION: No acute osseous abnormality. Mild tricompartment arthritis with
probable trace knee effusion.

## 2022-07-16 NOTE — Progress Notes (Signed)
Office Visit    Patient Name: Jeffrey Winters Date of Encounter: 07/16/2022  Primary Care Provider:  Fenton Foy, NP Primary Cardiologist:  Sherren Mocha, MD Primary Electrophysiologist: None  Chief Complaint    Jeffrey Winters is a 56 y.o. male with PMH of CAD s/p NSTEMI 2017 with DES to [place] and subsequent NSTEMI in 2017 and 2020 with DES to RCA and OM1 with residual RPL2 at 80%.,  PAD s/p left SFA and right SFA with PTA, HTN, HLD, tobacco use, COPD who presents today for 6-week follow-up.  Past Medical History    Past Medical History:  Diagnosis Date   COPD (chronic obstructive pulmonary disease) ([place])    Coronary artery disease 2017   MI s/p PCI to OM2 // s/p MI 07/2019 >> DES to RCA; residual RPL2 80 // Myoview 08/2019: EF 61, small area of mod ischemia inf base; Low Risk // Cath 5/23: RCA and OM1 stents ok, mod dz LAD neg RFR, RPLA 80 - med Rx unless refractory angina   Hyperlipidemia LDL goal <70 07/12/2019   Hyperlipidemia   Hypertension    MI (myocardial infarction) ([place])    PAD (peripheral artery disease) ([place])    LE Art [place] 08/2019: R SFA 50-74; L SFA 50-74 // s/p R SFA PTA in 4/21; s/p L SFA PTA in 3/21 (Dr. Gwenlyn Found)   Tobacco use    Past Surgical History:  Procedure Laterality Date   ABDOMINAL AORTOGRAM W/LOWER EXTREMITY Bilateral 10/05/2019   Procedure: ABDOMINAL AORTOGRAM W/LOWER EXTREMITY;  Surgeon: Lorretta Harp, MD;  Location: [facility];  Service: Cardiovascular;  Laterality: Bilateral;   ABDOMINAL AORTOGRAM W/LOWER EXTREMITY N/A 11/09/2019   Procedure: ABDOMINAL AORTOGRAM W/LOWER EXTREMITY;  Surgeon: Lorretta Harp, MD;  Location: [facility];  Service: Cardiovascular;  Laterality: N/A;   CORONARY/GRAFT ACUTE MI REVASCULARIZATION N/A 07/10/2019   Procedure: CORONARY/GRAFT ACUTE MI REVASCULARIZATION;  Surgeon: Nelva Bush, MD;  Location: [facility];  Service: Cardiovascular;  Laterality: N/A;   INTRAVASCULAR LITHOTRIPSY   11/09/2019   Procedure: INTRAVASCULAR LITHOTRIPSY;  Surgeon: Lorretta Harp, MD;  Location: [facility];  Service: Cardiovascular;;   INTRAVASCULAR PRESSURE WIRE/FFR STUDY N/A 12/10/2021   Procedure: INTRAVASCULAR PRESSURE WIRE/FFR STUDY;  Surgeon: Sherren Mocha, MD;  Location: [facility];  Service: Cardiovascular;  Laterality: N/A;   LEFT HEART CATH AND CORONARY ANGIOGRAPHY N/A 07/10/2019   Procedure: LEFT HEART CATH AND CORONARY ANGIOGRAPHY;  Surgeon: Nelva Bush, MD;  Location: [facility];  Service: Cardiovascular;  Laterality: N/A;   LEFT HEART CATH AND CORONARY ANGIOGRAPHY N/A 12/10/2021   Procedure: LEFT HEART CATH AND CORONARY ANGIOGRAPHY;  Surgeon: Sherren Mocha, MD;  Location: [facility];  Service: Cardiovascular;  Laterality: N/A;   PERCUTANEOUS CORONARY STENT INTERVENTION (PCI-S)  2017   DES to OM2 at [place] Left 10/05/2019   Procedure: PERIPHERAL VASCULAR ATHERECTOMY;  Surgeon: Lorretta Harp, MD;  Location: [facility];  Service: Cardiovascular;  Laterality: Left;  SFA   PERIPHERAL VASCULAR BALLOON ANGIOPLASTY  11/09/2019   Procedure: PERIPHERAL VASCULAR BALLOON ANGIOPLASTY;  Surgeon: Lorretta Harp, MD;  Location: [facility];  Service: Cardiovascular;;   PERIPHERAL VASCULAR INTERVENTION Left 10/05/2019   Procedure: PERIPHERAL VASCULAR INTERVENTION;  Surgeon: Lorretta Harp, MD;  Location: [facility];  Service: Cardiovascular;  Laterality: Left;  SFA    Allergies  No Known Allergies  History  of Present Illness    Jeffrey Winters  is a 56 year old male with the above mention past medical history who presents today for 6-week follow-up.  Jeffrey Winters was initially seen for complaint of chest pain in 07/2019 and found to have inferior STEMI.  LHC was performed that revealed multivessel CAD with acute occlusion of distal RCA treated with DES x 1 as well as 50-60% mid LAD and 80%PL2 stenosis.   TTE was performed revealing EF of 55% with LVH and severe hypokinesis and grade 2 DD and normal valvular function.  He was seen in follow-up 07/2019 and had an episode of shortness of breath and EKG did not reveal any acute changes. Myoview in 08/2019 demonstrated a small amount of inferior ischemia.  He is managed medically. He also noted bilateral calf pain and was referred to Dr. Gwenlyn Found for further evaluation.  He underwent left SFA PTA on 10/2019 and he underwent balloon angioplasty to the right SFA by Dr. Gwenlyn Found in April 2021. He was seen in 12/2021 for complaint of chest pain.  EKG and troponins were unremarkable but with his history of unstable angina left heart cath was repeated that revealed severe stenosis of right PLA branch with moderate nonobstructive stenosis of mid LAD. TTE completed showed EF of 55% with normal LV function and RV systolic function and basal inferior hypokinesis no longer present.  He was seen last on 05/2022 for follow-up.  During visit he noted chest pain from time to time with heavy exertion. He noted chest pain that developed while digging a hole that lasted 4 to 5 hours without need for nitroglycerin.  He reported an additional episode 2 days later also accompanied some shortness of breath and nausea.  GDMT was optimized with antianginal medications added to pain 2.5 mg.  TTE was also completed to evaluate for possible wall motion abnormalities that revealed normal LV function.  Jeffrey Winters presents today for 96-monthfollow-up alone.  Since last being seen in the office patient reports that he is doing much better but does still experience bouts of chest pain with activity.  He reports that chest discomfort is worse in intensity but is still bothersome.  He denies any chest pain today during our visit.  He also reports that his right leg has increased in swelling since beginning amlodipine.  His blood pressure today is well-controlled at 114/70 and heart rate is 61 bpm.  He is  scheduled to have 3 teeth pulled by his dentist and also will need surgical clearance during today's visit.  I informed him to send a formal clearance from his dentist to our office for completion of process.  He reports that he is staying active and riding his bike at least 1 mile daily.  He reports no angina with this activity but states that he has not really pushed himself.  Patient denies chest pain, palpitations, dyspnea, PND, orthopnea, nausea, vomiting, dizziness, syncope, edema, weight gain, or early satiety.  Home Medications    Current Outpatient Medications  Medication Sig Dispense Refill   albuterol (VENTOLIN HFA) 108 (90 Base) MCG/ACT inhaler INHALE 1-2 PUFFS INTO THE LUNGS EVERY 6 (SIX) HOURS AS NEEDED FOR WHEEZING OR SHORTNESS OF BREATH. 8 g 2   amLODipine (NORVASC) 2.5 MG tablet Take 1 tablet (2.5 mg total) by mouth daily. 90 tablet 3   ammonium lactate (AMLACTIN) 12 % lotion Apply 1 application topically as needed for dry skin. 400 g 0   aspirin EC 81 MG  tablet Take 1 tablet orally daily 90 tablet 1   atorvastatin (LIPITOR) 80 MG tablet Take 1 tablet (80 mg total) by mouth daily. 90 tablet 3   buPROPion (WELLBUTRIN XL) 150 MG 24 hr tablet Take 1 tablet by mouth at bedtime 30 tablet 2   clopidogrel (PLAVIX) 75 MG tablet Take 1 tablet (75 mg total) by mouth daily. 90 tablet 3   escitalopram (LEXAPRO) 10 MG tablet Take 10 mg by mouth daily.     famotidine (PEPCID) 20 MG tablet Take 1 tablet by mouth 2 times daily 60 tablet 1   isosorbide mononitrate (IMDUR) 60 MG 24 hr tablet Take 1 tablet (60 mg total) by mouth daily. 90 tablet 3   metoprolol succinate (TOPROL-XL) 25 MG 24 hr tablet TAKE 1 TABLET BY MOUTH DAILY, MAY TAKE ONE ADDITIONAL TABLET AS NEEDED FOR PALPITATIONS 90 tablet 1   nitroGLYCERIN (NITROSTAT) 0.4 MG SL tablet Take 1 tablet sublingually every 5 minutes as needed for chest pain 25 tablet 1   terbinafine (LAMISIL) 250 MG tablet Take 1 tablet (250 mg total) by mouth  daily. 30 tablet 0   No current facility-administered medications for this visit.     Review of Systems  Please see the history of present illness.    (+) Right lower extremity swelling (+) Waxing waning chest discomfort  All other systems reviewed and are otherwise negative except as noted above.  Physical Exam    Wt Readings from Last 3 Encounters:  06/02/22 231 lb (104.8 kg)  05/20/22 231 lb (104.8 kg)  03/11/22 227 lb (103 kg)   KG:URKYH were no vitals filed for this visit.,There is no height or weight on file to calculate BMI.  Constitutional:      Appearance: Healthy appearance. Not in distress.  Neck:     Vascular: JVD normal.  Pulmonary:     Effort: Pulmonary effort is normal.     Breath sounds: No wheezing. No rales. Diminished in the bases Cardiovascular:     Normal rate. Regular rhythm. Normal S1. Normal S2.      Murmurs: There is no murmur.  Edema:    Peripheral edema swelling right lower extremity +2 Abdominal:     Palpations: Abdomen is soft non tender. There is no hepatomegaly.  Skin:    General: Skin is warm and dry.  Neurological:     General: No focal deficit present.     Mental Status: Alert and oriented to person, place and time.     Cranial Nerves: Cranial nerves are intact.  EKG/LABS/Other Studies Reviewed    ECG personally reviewed by me today -none completed today    Lab Results  Component Value Date   WBC 4.4 05/06/2022   HGB 14.9 05/06/2022   HCT 45.2 05/06/2022   MCV 86.4 05/06/2022   PLT 159 05/06/2022   Lab Results  Component Value Date   CREATININE 1.09 03/05/2022   BUN 21 03/05/2022   NA 139 03/05/2022   K 4.3 03/05/2022   CL 102 03/05/2022   CO2 27 03/05/2022   Lab Results  Component Value Date   ALT 25 05/06/2022   AST 24 05/06/2022   ALKPHOS 75 03/12/2022   BILITOT 0.3 05/06/2022   Lab Results  Component Value Date   CHOL 122 03/12/2022   HDL 45 03/12/2022   LDLCALC 50 03/12/2022   TRIG 162 (H) 03/12/2022    CHOLHDL 2.7 03/12/2022    Lab Results  Component Value Date   HGBA1C 6.1 (  A) 12/26/2021   HGBA1C 6.1 12/26/2021   HGBA1C 6.1 12/26/2021   HGBA1C 6.1 12/26/2021    Assessment & Plan    1.  Coronary artery disease: -s/p NSTEMI in 2017 treated with DES to OM1, repeat NSTEMI 05/2016, STEMI 2020 with DES to RCA repeat LHC performed 12/2021 -Today during our visit patient reported that chest pain has continued to occur but is less in intensity and has not required any additional nitroglycerin. -We will increase Imdur to 90 mg daily and patient will discontinue amlodipine due to right lower extremity swelling. -He is currently chest pain-free today  -Continue current GDMT with atorvastatin 80 mg, Toprol 25 mg, ASA 81 mg, Plavix 75 mg daily  2.  Essential hypertension: -Patient's blood pressure today was well-controlled at 114/70 -Continue current antihypertensive regimen with Toprol 25 mg daily  3.  Hyperlipidemia: -Patient's last LDL was 58 at goal of less than 70 -Continue current GDMT with Lipitor 80 mg daily  4.  Peripheral artery disease: -s/p LE Art [place] 08/2019: R SFA 50-74; L SFA 50-74 // s/p R SFA PTA in 4/21; s/p L SFA PTA -Currently followed by Dr.Berry  5.  Surgical clearance:  -The patient affirms he has been doing well without any new cardiac symptoms. They are able to achieve 6 METS without cardiac limitations. Therefore, based on ACC/AHA guidelines, the patient would be at acceptable risk for the planned procedure without further cardiovascular testing. The patient was advised that if he develops new symptoms prior to surgery to contact our office to arrange for a follow-up visit, and he verbalized understanding.   Jeffrey Winters perioperative risk of a major cardiac event is 6.6% according to the Revised Cardiac Risk Index (RCRI).  Therefore, he is at high risk for perioperative complications.   His functional capacity is good at 6.61 METs according to the [facility] Activity Status  Index (DASI). Recommendations: According to ACC/AHA guidelines, no further cardiovascular testing needed.  The patient may proceed to surgery at acceptable risk.   Antiplatelet and/or Anticoagulation Recommendations: To be made based on request from dental office  Disposition: Follow-up with Sherren Mocha, MD or APP in 3 months   Medication Adjustments/Labs and Tests Ordered: Current medicines are reviewed at length with the patient today.  Concerns regarding medicines are outlined above.   Signed, Mable Fill, Marissa Nestle, NP 07/16/2022, 2:19 PM [facility]  Note:  This document was prepared using Dragon voice recognition software and may include unintentional dictation errors.

## 2022-07-17 ENCOUNTER — Ambulatory Visit: Payer: Medicaid Other | Attending: Physician Assistant | Admitting: Nurse Practitioner

## 2022-07-17 VITALS — BP 114/70 | HR 61 | Ht 68.0 in | Wt 235.0 lb

## 2022-07-17 DIAGNOSIS — I739 Peripheral vascular disease, unspecified: Secondary | ICD-10-CM

## 2022-07-17 DIAGNOSIS — I10 Essential (primary) hypertension: Secondary | ICD-10-CM

## 2022-07-17 DIAGNOSIS — I25119 Atherosclerotic heart disease of native coronary artery with unspecified angina pectoris: Secondary | ICD-10-CM | POA: Diagnosis not present

## 2022-07-17 DIAGNOSIS — E785 Hyperlipidemia, unspecified: Secondary | ICD-10-CM

## 2022-07-17 DIAGNOSIS — Z01810 Encounter for preprocedural cardiovascular examination: Secondary | ICD-10-CM

## 2022-07-17 MED ORDER — ISOSORBIDE MONONITRATE ER 60 MG PO TB24: 90.0000 mg | ORAL_TABLET | Freq: Every day | ORAL | 1 refills | Status: DC

## 2022-07-17 NOTE — Patient Instructions (Signed)
Medication Instructions:  STOP NORVASC INCREASE Imdur to '90mg'$  daily  *If you need a refill on your cardiac medications before your next appointment, please call your pharmacy*   Lab Work: None ordered   Testing/Procedures: None ordered   Follow-Up: At [facility], you and your health needs are our priority.  As part of our continuing mission to provide you with exceptional heart care, we have created designated Provider Care Teams.  These Care Teams include your primary Cardiologist (physician) and Advanced Practice Providers (APPs -  Physician Assistants and Nurse Practitioners) who all work together to provide you with the care you need, when you need it.  We recommend signing up for the patient portal called "MyChart".  Sign up information is provided on this After Visit Summary.  MyChart is used to connect with patients for Virtual Visits (Telemedicine).  Patients are able to view lab/test results, encounter notes, upcoming appointments, etc.  Non-urgent messages can be sent to your provider as well.   To learn more about what you can do with MyChart, go to [email or web address].    Your next appointment:   3 month(s)  The format for your next appointment:   In Person  Provider:   Ambrose Pancoast, NP      Other Instructions Check your blood pressure daily for 2 weeks then contact the office with your readings   Important Information About [place]

## 2022-07-19 ENCOUNTER — Other Ambulatory Visit: Payer: Self-pay | Admitting: Cardiovascular Disease

## 2022-07-19 DIAGNOSIS — E782 Mixed hyperlipidemia: Secondary | ICD-10-CM

## 2022-07-19 DIAGNOSIS — I10 Essential (primary) hypertension: Secondary | ICD-10-CM

## 2022-07-19 DIAGNOSIS — I2510 Atherosclerotic heart disease of native coronary artery without angina pectoris: Secondary | ICD-10-CM

## 2022-07-23 ENCOUNTER — Telehealth: Payer: Self-pay | Admitting: *Deleted

## 2022-07-23 NOTE — Telephone Encounter (Signed)
   Pre-operative Risk Assessment    Patient Name: Jeffrey Winters  DOB: 1965-08-07 MRN: [id number]     Request for Surgical Clearance    Procedure:  Dental Extraction - Amount of Teeth to be Pulled:  3  Date of Surgery:  Clearance 07/23/22                                 Surgeon:   Surgeon's Group or Practice Name:  [facility] Phone number:  [phone] Fax number:  [phone]   Type of Clearance Requested:   - Medical    Type of Anesthesia:  Local    Additional requests/questions:    [name]   07/23/2022, 8:43 AM

## 2022-07-23 NOTE — Telephone Encounter (Signed)
    Primary Cardiologist: Sherren Mocha, MD  Chart reviewed as part of pre-operative protocol coverage. Simple dental extractions are considered low risk procedures per guidelines and generally do not require any specific cardiac clearance. It is also generally accepted that for simple extractions and dental cleanings, there is no need to interrupt blood thinner therapy.   SBE prophylaxis is not required for the patient.  I will route this recommendation to the requesting party via Epic fax function and remove from pre-op pool.  Please call with questions.  Deberah Pelton, NP 07/23/2022, 10:25 AM

## 2022-08-01 IMAGING — DX DG CHEST 2V
3 series · 3 of 3 positions shown · non-contrast
Comparison: Chest radiograph 05/14/2016

CLINICAL DATA: KARLITAP

EXAM:
CHEST - 2 VIEW

[chest pa]
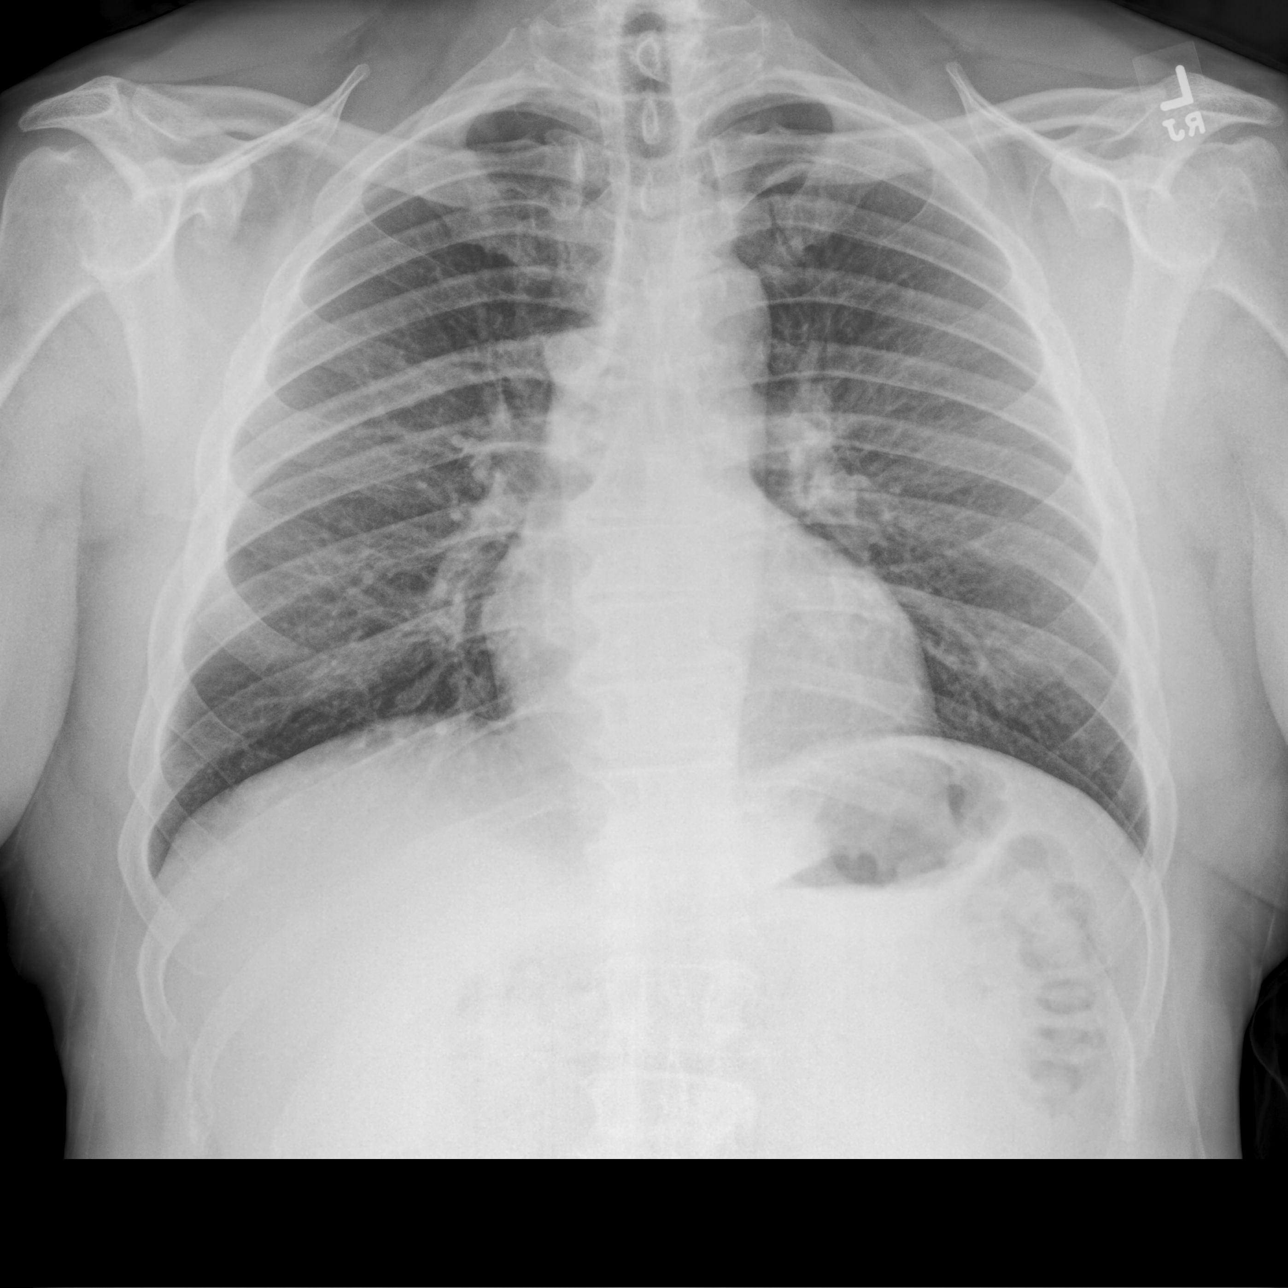

[chest lat (1 of 2)]
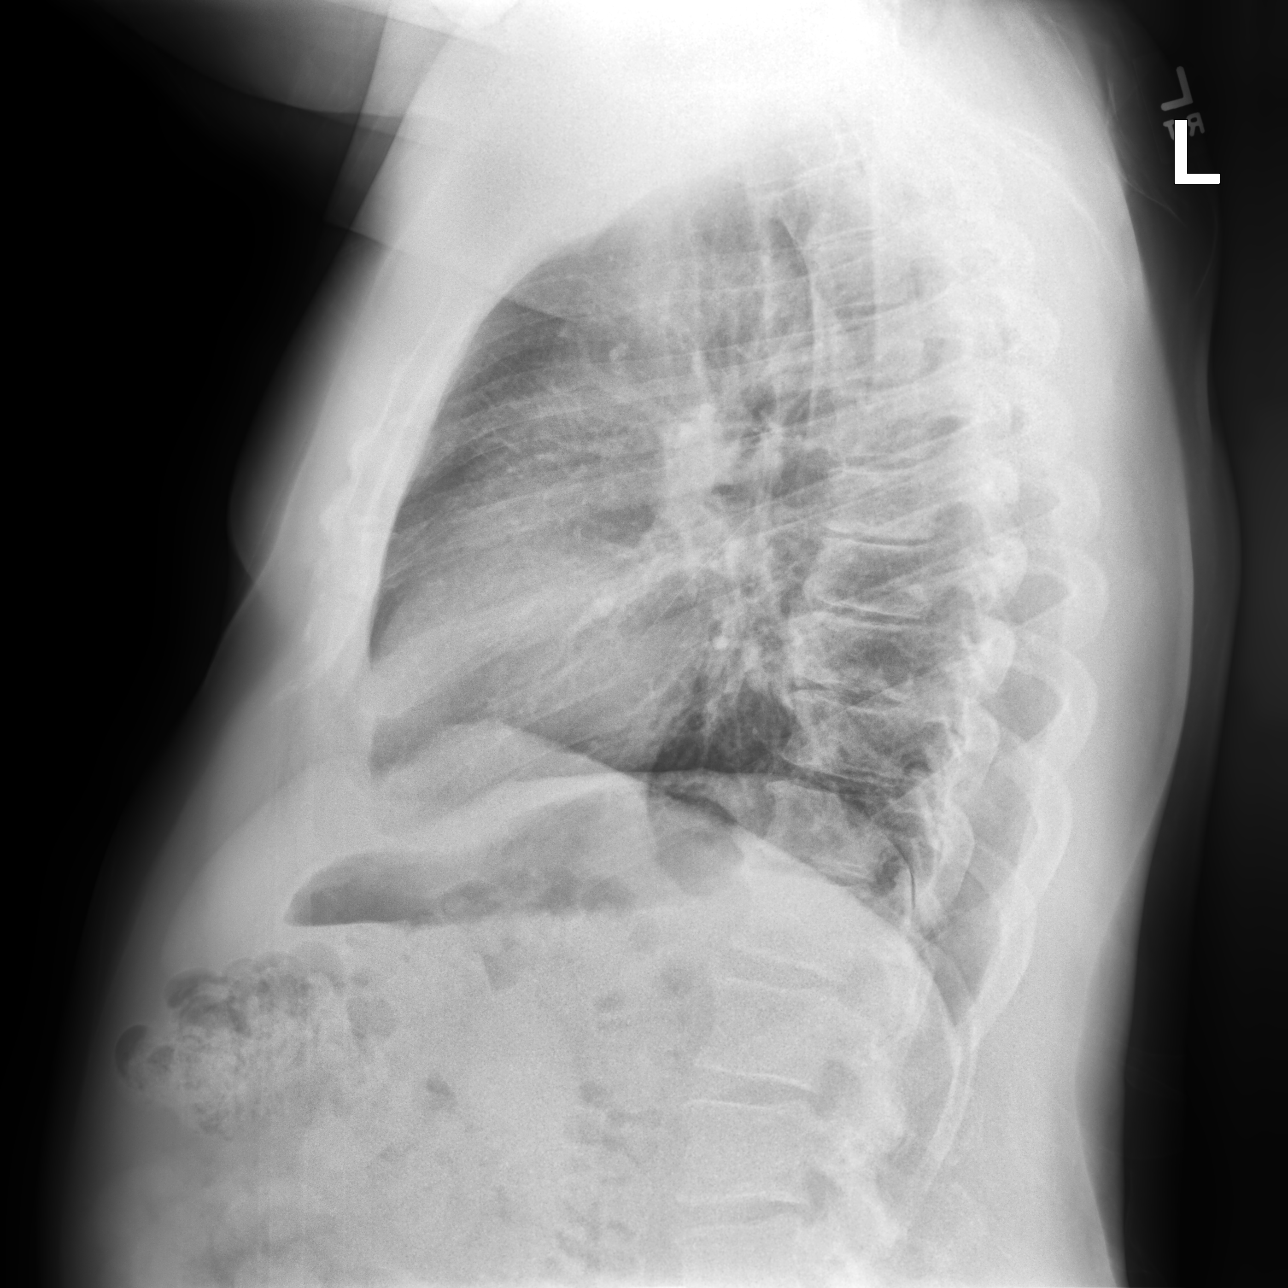

[chest lat (2 of 2)]
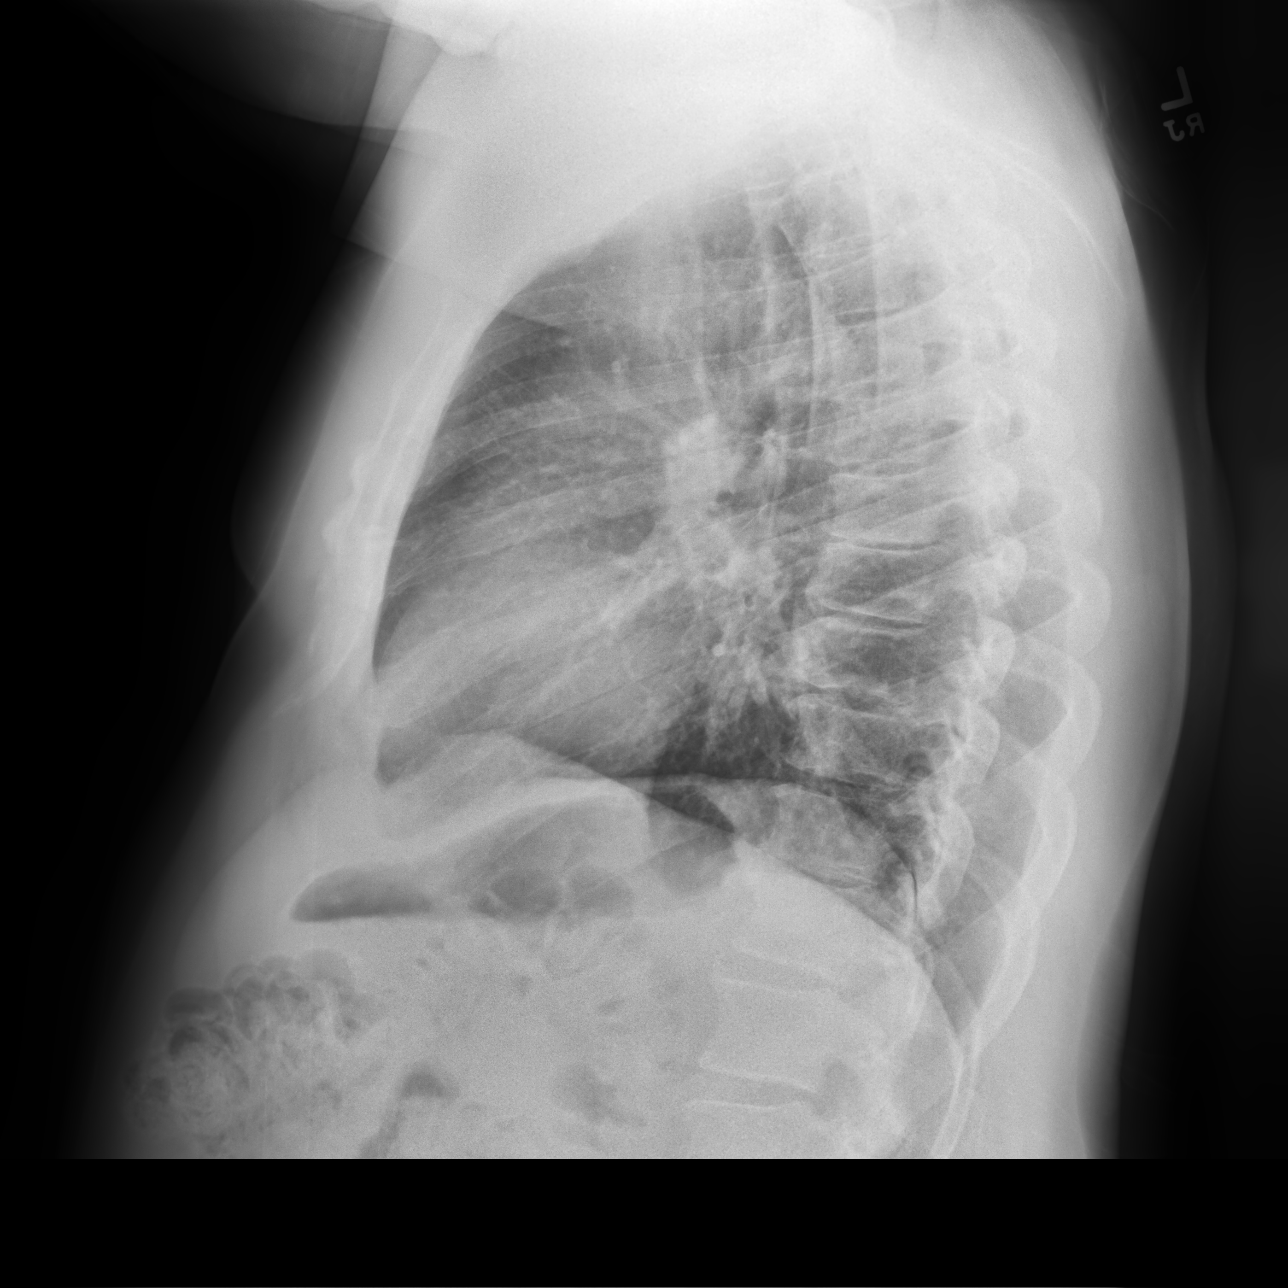

[3 of 3 positions shown; findings below may reference images not displayed]

FINDINGS: Stable cardiomediastinal contours with normal heart size. Prominent
bilateral interstitial markings. No focal consolidation. No
pneumothorax or pleural effusion. Degenerative changes in the
thoracic spine.
IMPRESSION: Prominent bilateral interstitial markings favored to represent
bronchitis, possibly chronic given history of smoking.

## 2022-08-13 ENCOUNTER — Other Ambulatory Visit: Payer: Self-pay | Admitting: Nurse Practitioner

## 2022-08-13 ENCOUNTER — Ambulatory Visit: Payer: Medicaid Other | Admitting: Nurse Practitioner

## 2022-08-13 ENCOUNTER — Encounter: Payer: Self-pay | Admitting: Nurse Practitioner

## 2022-08-13 VITALS — BP 118/55 | HR 74 | Temp 98.0°F | Ht 72.0 in | Wt 235.2 lb

## 2022-08-13 DIAGNOSIS — Z1159 Encounter for screening for other viral diseases: Secondary | ICD-10-CM

## 2022-08-13 DIAGNOSIS — L853 Xerosis cutis: Secondary | ICD-10-CM | POA: Diagnosis not present

## 2022-08-13 DIAGNOSIS — I10 Essential (primary) hypertension: Secondary | ICD-10-CM

## 2022-08-13 DIAGNOSIS — F419 Anxiety disorder, unspecified: Secondary | ICD-10-CM | POA: Diagnosis not present

## 2022-08-13 DIAGNOSIS — Z1211 Encounter for screening for malignant neoplasm of colon: Secondary | ICD-10-CM | POA: Diagnosis not present

## 2022-08-13 DIAGNOSIS — F32A Depression, unspecified: Secondary | ICD-10-CM

## 2022-08-13 MED ORDER — BUPROPION HCL ER (XL) 150 MG PO TB24: ORAL_TABLET | ORAL | 2 refills | Status: DC

## 2022-08-13 MED ORDER — AMMONIUM LACTATE 12 % EX LOTN: 1.0000 | TOPICAL_LOTION | CUTANEOUS | 0 refills | Status: DC | PRN

## 2022-08-13 MED ORDER — ESCITALOPRAM OXALATE 10 MG PO TABS: 10.0000 mg | ORAL_TABLET | Freq: Every day | ORAL | 2 refills | Status: DC

## 2022-08-13 NOTE — Progress Notes (Signed)
$'@Patient'R$  ID: Jeffrey Winters, male    DOB: 02-24-1966, 57 y.o.   MRN: [id number]  Chief Complaint  Patient presents with   Follow-up          Referring provider: Fenton Foy, NP   HPI  Jeffrey Winters is a 57 y.o. male with a history of CAD s/p PCI to OM 2 2017 and to RCA 2020, PAD s/p left SFA PTA 10/2019 and right SFA PTA 11/2019, HTN, hyperlipidemia, tobacco abuse   Patient presents today for a routine follow-up visit.  He states that overall since his last visit he has been doing well.  He has no new issues or concerns today.  He does need refills on some of his medications today.  Patient will need blood work today.  Blood pressure stable in office today.  Patient is taking medication as directed.  He did find a bottle of amlodipine and realized that he has not been taking that.  His blood pressure is stable so we discussed that he continue on his medications that he has been taking. Denies f/c/s, n/v/d, hemoptysis, PND, leg swelling Denies chest pain or edema     No Known Allergies   There is no immunization history on file for this patient.  Past Medical History:  Diagnosis Date   COPD (chronic obstructive pulmonary disease) ([place])    Coronary artery disease 2017   MI s/p PCI to OM2 // s/p MI 07/2019 >> DES to RCA; residual RPL2 80 // Myoview 08/2019: EF 61, small area of mod ischemia inf base; Low Risk // Cath 5/23: RCA and OM1 stents ok, mod dz LAD neg RFR, RPLA 80 - med Rx unless refractory angina   Hyperlipidemia LDL goal <70 07/12/2019   Hyperlipidemia   Hypertension    MI (myocardial infarction) ([place])    PAD (peripheral artery disease) ([place])    LE Art [place] 08/2019: R SFA 50-74; L SFA 50-74 // s/p R SFA PTA in 4/21; s/p L SFA PTA in 3/21 (Dr. Gwenlyn Winters)   Tobacco use     Tobacco History: Social History   Tobacco Use  Smoking Status Every Day   Packs/day: 1.00   Years: 7.00   Total pack years: 7.00   Types: Cigarettes  Smokeless Tobacco Never  Tobacco  Comments   smokes 1/2 pack per day 06/19/2020   Ready to quit: Not Answered Counseling given: Not Answered Tobacco comments: smokes 1/2 pack per day 06/19/2020   Outpatient Encounter Medications as of 08/13/2022  Medication Sig   albuterol (VENTOLIN HFA) 108 (90 Base) MCG/ACT inhaler INHALE 1-2 PUFFS INTO THE LUNGS EVERY 6 (SIX) HOURS AS NEEDED FOR WHEEZING OR SHORTNESS OF BREATH.   aspirin EC 81 MG tablet Take 1 tablet orally daily   atorvastatin (LIPITOR) 80 MG tablet Take 1 tablet (80 mg total) by mouth daily.   clopidogrel (PLAVIX) 75 MG tablet Take 1 tablet (75 mg total) by mouth daily.   famotidine (PEPCID) 20 MG tablet Take 1 tablet by mouth 2 times daily   isosorbide mononitrate (IMDUR) 60 MG 24 hr tablet Take 1.5 tablets (90 mg total) by mouth daily.   metoprolol succinate (TOPROL-XL) 25 MG 24 hr tablet Take 1 tablet (25 mg total) by mouth daily.   nitroGLYCERIN (NITROSTAT) 0.4 MG SL tablet Take 1 tablet sublingually every 5 minutes as needed for chest pain   terbinafine (LAMISIL) 250 MG tablet Take 1 tablet (250 mg total) by mouth daily.   [DISCONTINUED] ammonium lactate (  AMLACTIN) 12 % lotion Apply 1 application topically as needed for dry skin.   [DISCONTINUED] buPROPion (WELLBUTRIN XL) 150 MG 24 hr tablet Take 1 tablet by mouth at bedtime   [DISCONTINUED] escitalopram (LEXAPRO) 10 MG tablet Take 10 mg by mouth daily.   ammonium lactate (AMLACTIN) 12 % lotion Apply 1 Application topically as needed for dry skin.   buPROPion (WELLBUTRIN XL) 150 MG 24 hr tablet Take 1 tablet by mouth at bedtime   escitalopram (LEXAPRO) 10 MG tablet Take 1 tablet (10 mg total) by mouth daily.   [DISCONTINUED] AMLODIPINE BESYLATE PO Take by mouth.   No facility-administered encounter medications on file as of 08/13/2022.     Review of Systems  Review of Systems  Constitutional: Negative.   HENT: Negative.    Cardiovascular: Negative.   Gastrointestinal: Negative.   Allergic/Immunologic:  Negative.   Neurological: Negative.   Psychiatric/Behavioral: Negative.         Physical Exam  BP (!) 118/55   Pulse 74   Temp 98 F (36.7 C)   Ht 6' (1.829 m)   Wt 235 lb 3.2 oz (106.7 kg)   SpO2 99%   BMI 31.90 kg/m   Wt Readings from Last 5 Encounters:  08/13/22 235 lb 3.2 oz (106.7 kg)  07/17/22 235 lb (106.6 kg)  06/02/22 231 lb (104.8 kg)  05/20/22 231 lb (104.8 kg)  03/11/22 227 lb (103 kg)     Physical Exam Vitals and nursing note reviewed.  Constitutional:      General: He is not in acute distress.    Appearance: He is well-developed.  Cardiovascular:     Rate and Rhythm: Normal rate and regular rhythm.  Pulmonary:     Effort: Pulmonary effort is normal.     Breath sounds: Normal breath sounds.  Skin:    General: Skin is warm and dry.  Neurological:     Mental Status: He is alert and oriented to person, place, and time.      Lab Results:  CBC    Component Value Date/Time   WBC 4.4 05/06/2022 1122   RBC 5.23 05/06/2022 1122   HGB 14.9 05/06/2022 1122   HGB 14.2 11/07/2019 1432   HCT 45.2 05/06/2022 1122   HCT 43.1 11/07/2019 1432   PLT 159 05/06/2022 1122   PLT 178 11/07/2019 1432   MCV 86.4 05/06/2022 1122   MCV 83 11/07/2019 1432   MCH 28.5 05/06/2022 1122   MCHC 33.0 05/06/2022 1122   RDW 14.1 05/06/2022 1122   RDW 13.9 11/07/2019 1432   LYMPHSABS 2,323 05/06/2022 1122   MONOABS 0.7 11/26/2020 0401   EOSABS 40 05/06/2022 1122   BASOSABS 22 05/06/2022 1122    BMET    Component Value Date/Time   NA 139 03/05/2022 1104   NA 138 09/10/2021 1044   K 4.3 03/05/2022 1104   CL 102 03/05/2022 1104   CO2 27 03/05/2022 1104   GLUCOSE 90 03/05/2022 1104   BUN 21 03/05/2022 1104   BUN 14 09/10/2021 1044   CREATININE 1.09 03/05/2022 1104   CALCIUM 9.1 03/05/2022 1104   GFRNONAA >60 12/31/2021 1240   GFRAA 94 11/07/2019 1432    BNP No results Winters for: "BNP"  ProBNP    Component Value Date/Time   PROBNP 233 (H) 07/21/2019 1120     Imaging: No results Winters.   Assessment & Plan:   Dry skin - ammonium lactate (AMLACTIN) 12 % lotion; Apply 1 Application topically as needed for dry  skin.  Dispense: 400 g; Refill: 0  2. Anxiety and depression  - escitalopram (LEXAPRO) 10 MG tablet; Take 1 tablet (10 mg total) by mouth daily.  Dispense: 30 tablet; Refill: 2 - buPROPion (WELLBUTRIN XL) 150 MG 24 hr tablet; Take 1 tablet by mouth at bedtime  Dispense: 30 tablet; Refill: 2  3. Colon cancer screening  - Cologuard  Follow up:  Follow up in 6 months or sooner     Jeffrey Foy, NP 08/13/2022

## 2022-08-13 NOTE — Patient Instructions (Signed)
1. Dry skin  - ammonium lactate (AMLACTIN) 12 % lotion; Apply 1 Application topically as needed for dry skin.  Dispense: 400 g; Refill: 0  2. Anxiety and depression  - escitalopram (LEXAPRO) 10 MG tablet; Take 1 tablet (10 mg total) by mouth daily.  Dispense: 30 tablet; Refill: 2 - buPROPion (WELLBUTRIN XL) 150 MG 24 hr tablet; Take 1 tablet by mouth at bedtime  Dispense: 30 tablet; Refill: 2  3. Colon cancer screening  - Cologuard  Follow up:  Follow up in 6 months or sooner

## 2022-08-13 NOTE — Assessment & Plan Note (Signed)
-   ammonium lactate (AMLACTIN) 12 % lotion; Apply 1 Application topically as needed for dry skin.  Dispense: 400 g; Refill: 0  2. Anxiety and depression  - escitalopram (LEXAPRO) 10 MG tablet; Take 1 tablet (10 mg total) by mouth daily.  Dispense: 30 tablet; Refill: 2 - buPROPion (WELLBUTRIN XL) 150 MG 24 hr tablet; Take 1 tablet by mouth at bedtime  Dispense: 30 tablet; Refill: 2  3. Colon cancer screening  - Cologuard  Follow up:  Follow up in 6 months or sooner

## 2022-08-13 NOTE — Addendum Note (Signed)
Addended by: Elyse Jarvis on: 08/13/2022 09:31 AM   Modules accepted: Orders

## 2022-08-14 ENCOUNTER — Other Ambulatory Visit: Payer: Self-pay | Admitting: Podiatry

## 2022-08-14 LAB — COMPREHENSIVE METABOLIC PANEL
ALT: 25 IU/L (ref 0–44)
AST: 25 IU/L (ref 0–40)
Albumin/Globulin Ratio: 1.3 (ref 1.2–2.2)
Albumin: 3.7 g/dL — ABNORMAL LOW (ref 3.8–4.9)
Alkaline Phosphatase: 88 IU/L (ref 44–121)
BUN/Creatinine Ratio: 14 (ref 9–20)
BUN: 13 mg/dL (ref 6–24)
Bilirubin Total: <0.2 mg/dL (ref 0.0–1.2)
CO2: 25 mmol/L (ref 20–29)
Calcium: 8.7 mg/dL (ref 8.7–10.2)
Chloride: 103 mmol/L (ref 96–106)
Creatinine, Ser: 0.91 mg/dL (ref 0.76–1.27)
Globulin, Total: 2.8 g/dL (ref 1.5–4.5)
Glucose: 104 mg/dL — ABNORMAL HIGH (ref 70–99)
Potassium: 4.7 mmol/L (ref 3.5–5.2)
Sodium: 141 mmol/L (ref 134–144)
Total Protein: 6.5 g/dL (ref 6.0–8.5)
eGFR: 99 mL/min/{1.73_m2} (ref >59)

## 2022-08-14 LAB — CBC
Hematocrit: 44.1 % (ref 37.5–51.0)
Hemoglobin: 14.3 g/dL (ref 13.0–17.7)
MCH: 28.2 pg (ref 26.6–33.0)
MCHC: 32.4 g/dL (ref 31.5–35.7)
MCV: 87 fL (ref 79–97)
Platelets: 159 10*3/uL (ref 150–450)
RBC: 5.07 x10E6/uL (ref 4.14–5.80)
RDW: 13.1 % (ref 11.6–15.4)
WBC: 4 10*3/uL (ref 3.4–10.8)

## 2022-08-14 LAB — HEPATITIS C ANTIBODY: Hep C Virus Ab: NONREACTIVE

## 2022-08-16 IMAGING — CR DG CHEST 2V
2 series · 2 of 2 positions shown · non-contrast
Comparison: 06/19/2020

CLINICAL DATA: Shortness of breath, chest pain and palpitations.

EXAM:
CHEST - 2 VIEW

[chest pa]
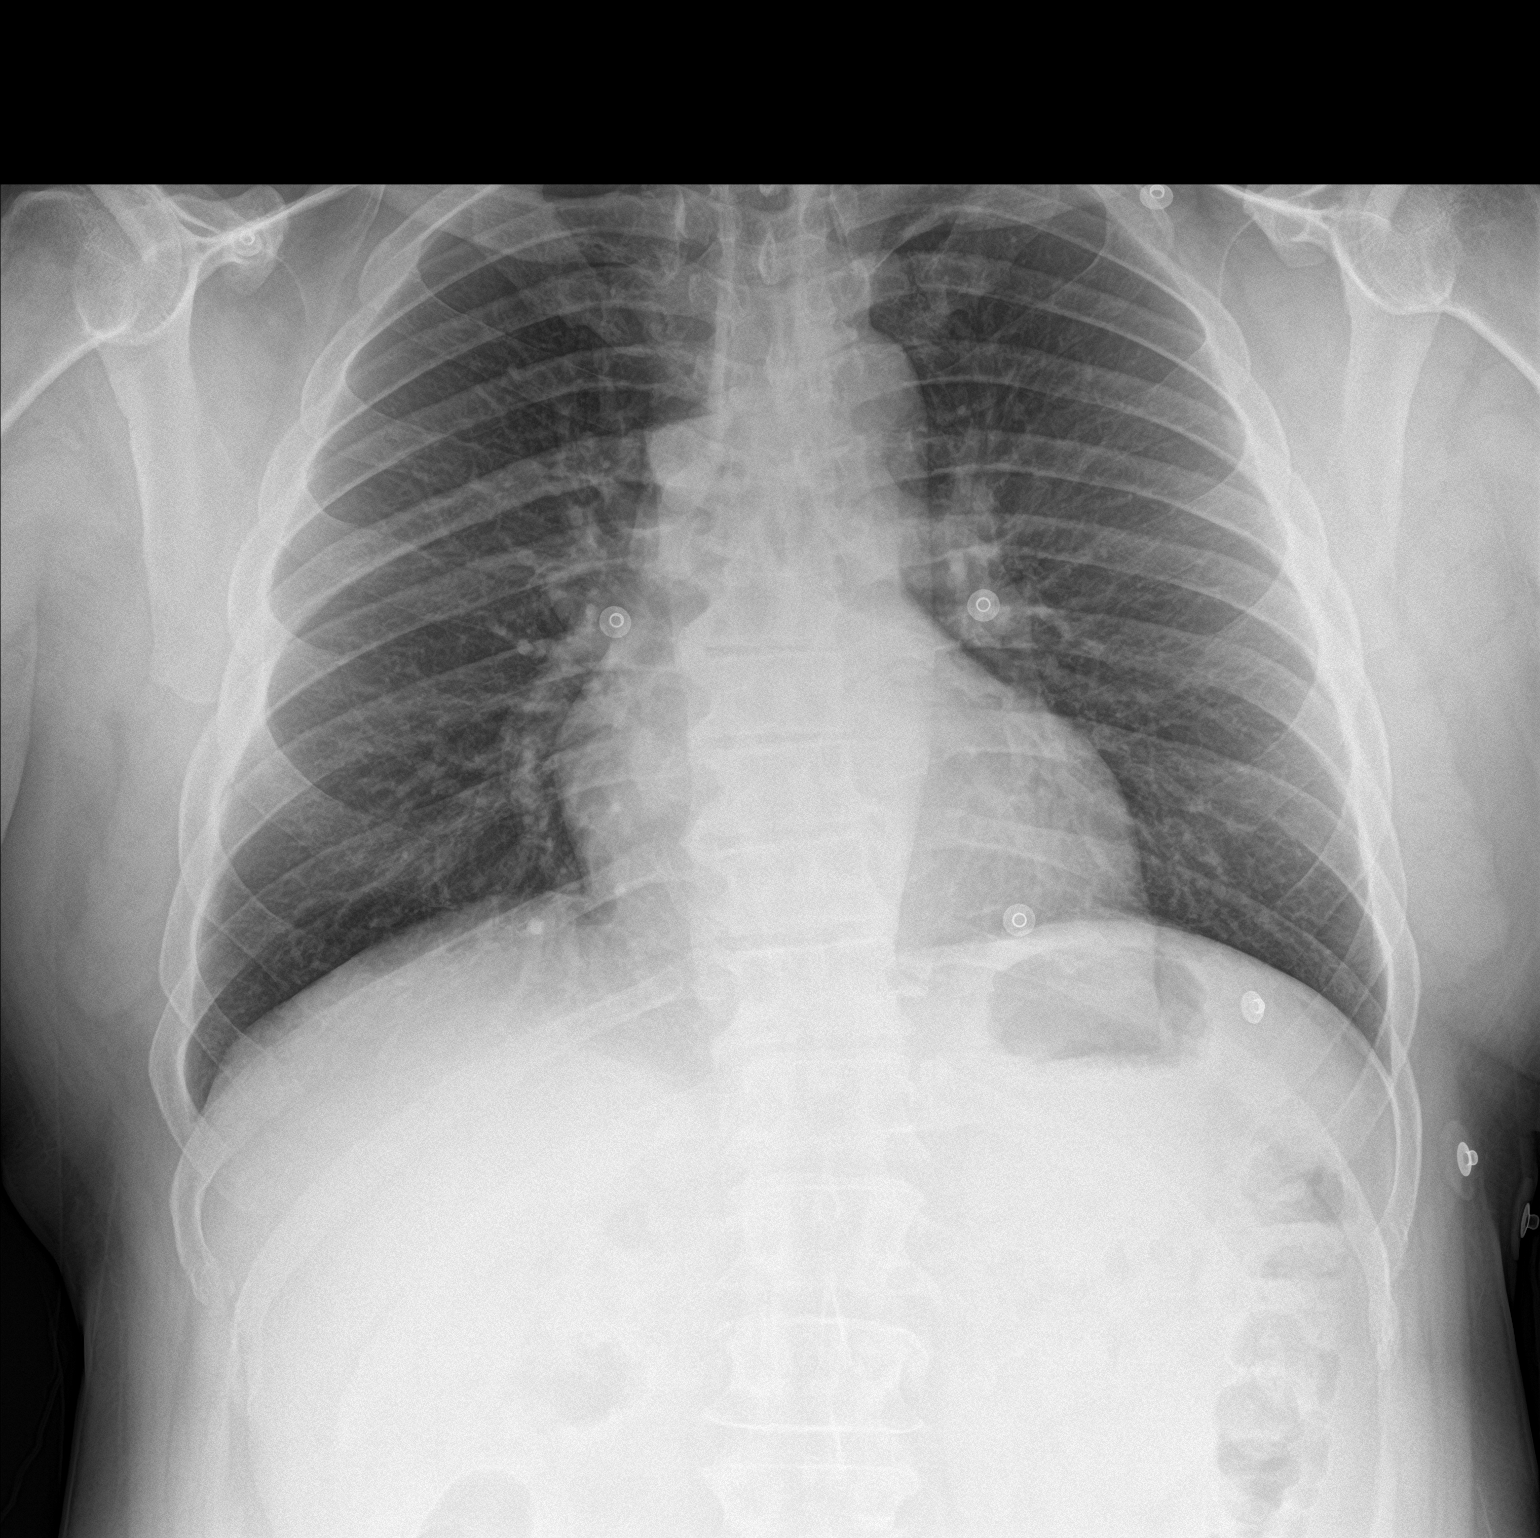

[chest lat]
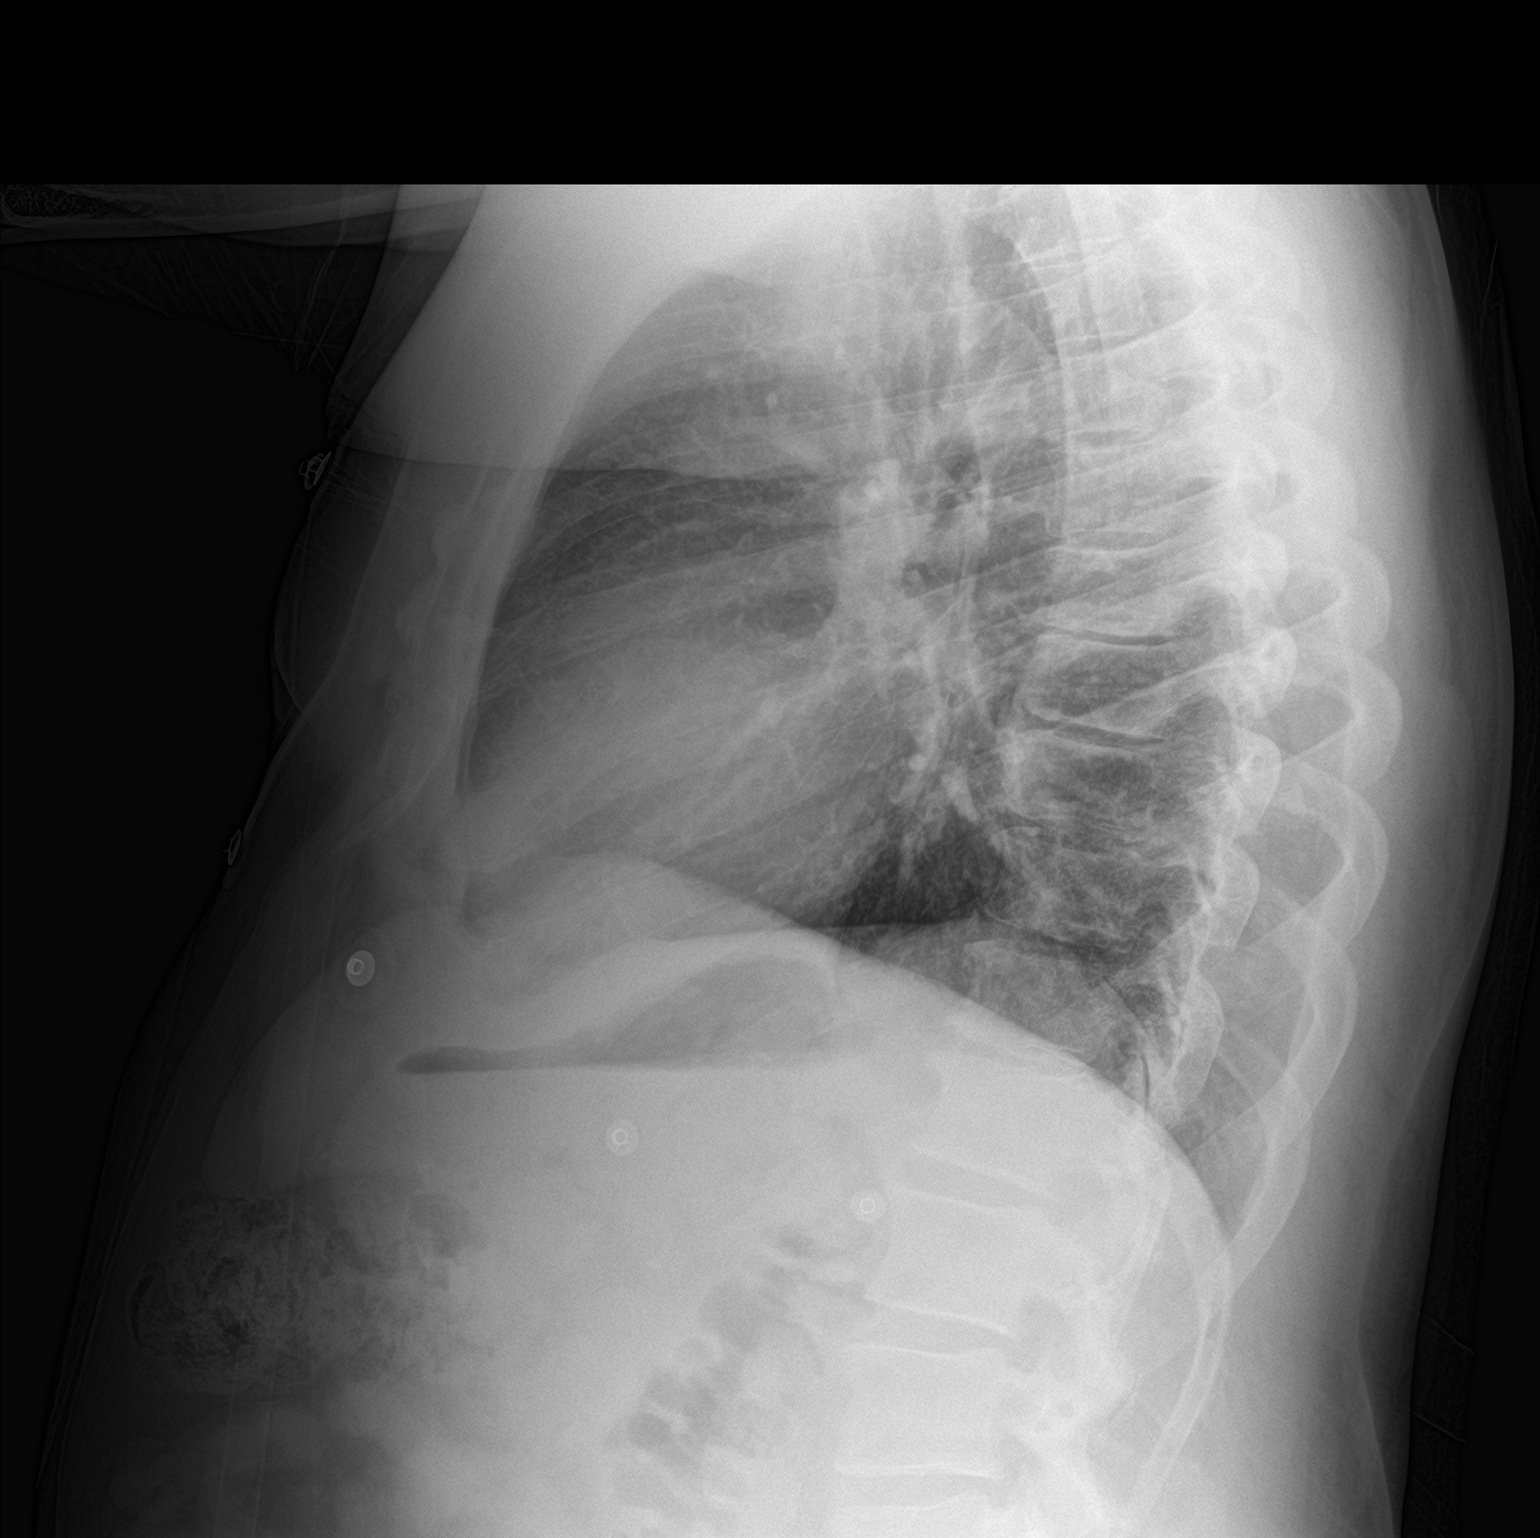

[2 of 2 positions shown; findings below may reference images not displayed]

FINDINGS: Heart, mediastinum and hila are unremarkable.

Clear lungs.

No pleural effusion or pneumothorax.

Skeletal structures are intact
IMPRESSION: No active cardiopulmonary disease.

## 2022-08-26 LAB — COLOGUARD: COLOGUARD: NEGATIVE

## 2022-09-02 ENCOUNTER — Other Ambulatory Visit: Payer: Self-pay | Admitting: Podiatry

## 2022-09-02 ENCOUNTER — Telehealth: Payer: Self-pay | Admitting: Podiatry

## 2022-09-02 MED ORDER — FLUCONAZOLE 150 MG PO TABS: 150.0000 mg | ORAL_TABLET | ORAL | 0 refills | Status: DC

## 2022-09-02 NOTE — Telephone Encounter (Signed)
Received refill request for Lamisil. I called the patient to see how he was doing and it has not changed much as far as the nails. Due to this I would like to change the medication. Sent fluconazole weekly and I would like to see him in 3 months. He had recent blood work so I did not repeat this.   Dawn, can you schedule a 3 month appointment? Thanks!

## 2022-09-05 ENCOUNTER — Other Ambulatory Visit: Payer: Self-pay | Admitting: Nurse Practitioner

## 2022-09-05 DIAGNOSIS — F419 Anxiety disorder, unspecified: Secondary | ICD-10-CM

## 2022-09-08 NOTE — Telephone Encounter (Signed)
Caller & Relationship to patient:  MRN #  [id number]   Call Back Number:   Date of Last Office Visit: 08/13/2022     Date of Next Office Visit: 02/11/2023    Medication(s) to be Refilled:  see below  [facility]  ** Please notify patient to allow 48-72 hours to process** **Let patient know to contact pharmacy at the end of the day to make sure medication is ready. ** **If patient has not been seen in a year or longer, book an appointment **Advise to use MyChart for refill requests OR to contact their pharmacy

## 2022-10-07 NOTE — Progress Notes (Signed)
Office Visit    Patient Name: Jeffrey Winters Date of Encounter: 10/07/2022  Primary Care Provider:  Fenton Foy, NP Primary Cardiologist:  Sherren Mocha, MD Primary Electrophysiologist: None  Chief Complaint    Jeffrey Winters is a 57 y.o. male with PMH of CAD s/p NSTEMI 2017 with DES to [place] and subsequent NSTEMI in 2017 and 2020 with DES to RCA and OM1 with residual RPL2 at 80%.,  PAD s/p left SFA and right SFA with PTA, HTN, HLD, tobacco use, COPD who presents today for 28-monthfollow-up.  Past Medical History    Past Medical History:  Diagnosis Date   COPD (chronic obstructive pulmonary disease) ([facility]    Coronary artery disease 2017   MI s/p PCI to OM2 // s/p MI 07/2019 >> DES to RCA; residual RPL2 80 // Myoview 08/2019: EF 61, small area of mod ischemia inf base; Low Risk // Cath 5/23: RCA and OM1 stents ok, mod dz LAD neg RFR, RPLA 80 - med Rx unless refractory angina   Hyperlipidemia LDL goal <70 07/12/2019   Hyperlipidemia   Hypertension    MI ([place]ocardial infarction) ([facility]    PAD (peripheral artery disease) ([facility]    LE Art [place]: R SFA 50-74; L SFA 50-74 // s/p R SFA PTA in 4/21; s/p L SFA PTA in 3/21 (Dr. BGwenlyn Found   Tobacco use    Past Surgical History:  Procedure Laterality Date   ABDOMINAL AORTOGRAM W/LOWER EXTREMITY Bilateral 10/05/2019   Procedure: ABDOMINAL AORTOGRAM W/LOWER EXTREMITY;  Surgeon: BLorretta Harp MD;  [facility];  Service: Cardiovascular;  Laterality: Bilateral;   ABDOMINAL AORTOGRAM W/LOWER EXTREMITY N/A 11/09/2019   Procedure: ABDOMINAL AORTOGRAM W/LOWER EXTREMITY;  Surgeon: BLorretta Harp MD;  [facility];  Service: Cardiovascular;  Laterality: N/A;   CORONARY/GRAFT ACUTE MI REVASCULARIZATION N/A 07/10/2019   Procedure: CORONARY/GRAFT ACUTE MI REVASCULARIZATION;  Surgeon: ENelva Bush MD;  [place] [facility];  Service: Cardiovascular;  Laterality: N/A;   INTRAVASCULAR LITHOTRIPSY  11/09/2019    Procedure: INTRAVASCULAR LITHOTRIPSY;  Surgeon: BLorretta Harp MD;  [place] [facility];  Service: Cardiovascular;;   INTRAVASCULAR PRESSURE WIRE/FFR STUDY N/A 12/10/2021   Procedure: INTRAVASCULAR PRESSURE WIRE/FFR STUDY;  Surgeon: CSherren Mocha MD;  [place] [facility];  Service: Cardiovascular;  Laterality: N/A;   LEFT HEART CATH AND CORONARY ANGIOGRAPHY N/A 07/10/2019   Procedure: LEFT HEART CATH AND CORONARY ANGIOGRAPHY;  Surgeon: ENelva Bush MD;  [facility];  Service: Cardiovascular;  Laterality: N/A;   LEFT HEART CATH AND CORONARY ANGIOGRAPHY N/A 12/10/2021   Procedure: LEFT HEART CATH AND CORONARY ANGIOGRAPHY;  Surgeon: CSherren Mocha MD;  [facility];  Service: Cardiovascular;  Laterality: N/A;   PERCUTANEOUS CORONARY STENT INTERVENTION (PCI-S)  2017   [place]   Procedure: PERIPHERAL VASCULAR ATHERECTOMY;  Surgeon: BLorretta Harp MD;  [facility];  Service: Cardiovascular;  Laterality: Left;  SFA   PERIPHERAL VASCULAR BALLOON ANGIOPLASTY  11/09/2019   Procedure: PERIPHERAL VASCULAR BALLOON ANGIOPLASTY;  Surgeon: BLorretta Harp MD;  [facility];  Service: Cardiovascular;;   PERIPHERAL VASCULAR INTERVENTION Left 10/05/2019   Procedure: PERIPHERAL VASCULAR INTERVENTION;  Surgeon: BLorretta Harp MD;  [facility];  Service: Cardiovascular;  Laterality: Left;  SFA    Allergies  No Known Allergies  History  of Present Illness    Jeffrey Winters  is a 57 year old male with the above mention past medical history who presents today for 71-monthfollow-up.  Mr. GManoswas initially seen for complaint of chest pain in 07/2019 and found to have inferior STEMI.  LHC was performed that revealed multivessel CAD with acute occlusion of distal RCA treated with DES x 1 as well as 50-60% mid LAD and 80%PL2 stenosis.  TTE  was performed revealing EF of 55% with LVH and severe hypokinesis and grade 2 DD and normal valvular function.  He was seen in follow-up 07/2019 and had an episode of shortness of breath and EKG did not reveal any acute changes. Myoview in 08/2019 demonstrated a small amount of inferior ischemia.  He is managed medically. He also noted bilateral calf pain and was referred to Dr. BGwenlyn Foundfor further evaluation.  He underwent left SFA PTA on 10/2019 and he underwent balloon angioplasty to the right SFA by Dr. BGwenlyn Foundin April 2021. He was seen in 12/2021 for complaint of chest pain.  EKG and troponins were unremarkable but with his history of unstable angina left heart cath was repeated that revealed severe stenosis of right PLA branch with moderate nonobstructive stenosis of mid LAD. TTE completed showed EF of 55% with normal LV function and RV systolic function and basal inferior hypokinesis no longer present.  He was seen last on 05/2022 for follow-up.  During visit he noted chest pain from time to time with heavy exertion. He noted chest pain that developed while digging a hole that lasted 4 to 5 hours without need for nitroglycerin.  He reported an additional episode 2 days later also accompanied some shortness of breath and nausea.  GDMT was optimized with antianginal medications added to pain 2.5 mg.  TTE was also completed to evaluate for possible wall motion abnormalities that revealed normal LV function. During his follow-up on 07/17/2022 patient still endorse some chest discomfort and Imdur was increased to 90 mg daily.   Since last being seen in the office patient reports that he is not experiencing any further chest discomfort since Imdur was increased. His blood pressure today is well-controlled at 122/64 and heart rate was 55 bpm.  He reports full medication compliance and denies any adverse reactions.  He is currently reporting increased claudication and swelling in his right lower extremity.  He is  unfortunately still smoking 1 pack/day and is also eating a diet high in saturated fats and red meats. We discussed the importance of reducing his consumption of red meat and also increasing his physical activity daily.  He is interested in smoking cessation and was provided resources and information today.  He denies any increased shortness of breath since his previous visit.  He is still riding his bike and endorses some chest tightness with increased exertion and going up hills.  Patient denies chest pain, palpitations, dyspnea, PND, orthopnea, nausea, vomiting, dizziness, syncope, edema, weight gain, or early satiety.  Home Medications    Current Outpatient Medications  Medication Sig Dispense Refill   albuterol (VENTOLIN HFA) 108 (90 Base) MCG/ACT inhaler INHALE 1-2 PUFFS INTO THE LUNGS EVERY 6 (SIX) HOURS AS NEEDED FOR WHEEZING OR SHORTNESS OF BREATH. 8 g 2   ammonium lactate (AMLACTIN) 12 % lotion Apply 1 Application topically as needed for dry skin. 400 g 0   aspirin EC 81 MG tablet Take 1 tablet orally daily 90 tablet 1   atorvastatin (LIPITOR) 80 MG tablet  Take 1 tablet (80 mg total) by mouth daily. 90 tablet 3   buPROPion (WELLBUTRIN XL) 150 MG 24 hr tablet TAKE 1 TABLET BY MOUTH EVERYDAY AT BEDTIME 90 tablet 1   clopidogrel (PLAVIX) 75 MG tablet Take 1 tablet (75 mg total) by mouth daily. 90 tablet 3   escitalopram (LEXAPRO) 10 MG tablet TAKE 1 TABLET BY MOUTH EVERY DAY 90 tablet 1   famotidine (PEPCID) 20 MG tablet Take 1 tablet by mouth 2 times daily 60 tablet 1   fluconazole (DIFLUCAN) 150 MG tablet Take 1 tablet (150 mg total) by mouth once a week. 12 tablet 0   isosorbide mononitrate (IMDUR) 60 MG 24 hr tablet Take 1.5 tablets (90 mg total) by mouth daily. 135 tablet 1   metoprolol succinate (TOPROL-XL) 25 MG 24 hr tablet Take 1 tablet (25 mg total) by mouth daily. 180 tablet 1   nitroGLYCERIN (NITROSTAT) 0.4 MG SL tablet Take 1 tablet sublingually every 5 minutes as needed for  chest pain 25 tablet 1   No current facility-administered medications for this visit.     Review of Systems  Please see the history of present illness.    (+) Right lower extremity edema (+) Right leg pain with ambulation  All other systems reviewed and are otherwise negative except as noted above.  Physical Exam    Wt Readings from Last 3 Encounters:  08/13/22 235 lb 3.2 oz (106.7 kg)  07/17/22 235 lb (106.6 kg)  06/02/22 231 lb (104.8 kg)   [id number] were no vitals filed for this visit.,There is no height or weight on file to calculate BMI.  Constitutional:      Appearance: Healthy appearance. Not in distress.  Neck:     Vascular: JVD normal.  Pulmonary:     Effort: Pulmonary effort is normal.     Breath sounds: No wheezing. No rales. Diminished in the bases Cardiovascular:     Normal rate. Regular rhythm. Normal S1. Normal S2.      Murmurs: There is no murmur.  Edema:    Peripheral edema absent.  Abdominal:     Palpations: Abdomen is soft non tender. There is no hepatomegaly.  Skin:    General: Skin is warm and dry.  Neurological:     General: No focal deficit present.     Mental Status: Alert and oriented to person, place and time.     Cranial Nerves: Cranial nerves are intact.  EKG/LABS/ Recent Cardiac Studies    ECG personally reviewed by me today -none completed today  Risk Assessment/Calculations:     Lab Results  Component Value Date   WBC 4.0 08/13/2022   HGB 14.3 08/13/2022   HCT 44.1 08/13/2022   MCV 87 08/13/2022   PLT 159 08/13/2022   Lab Results  Component Value Date   CREATININE 0.91 08/13/2022   BUN 13 08/13/2022   NA 141 08/13/2022   K 4.7 08/13/2022   CL 103 08/13/2022   CO2 25 08/13/2022   Lab Results  Component Value Date   ALT 25 08/13/2022   AST 25 08/13/2022   ALKPHOS 88 08/13/2022   BILITOT <0.2 08/13/2022   Lab Results  Component Value Date   CHOL 122 03/12/2022   HDL 45 03/12/2022   LDLCALC 50 03/12/2022   TRIG 162  (H) 03/12/2022   CHOLHDL 2.7 03/12/2022    Lab Results  Component Value Date   HGBA1C 6.1 (A) 12/26/2021   HGBA1C 6.1 12/26/2021   HGBA1C 6.1 12/26/2021  HGBA1C 6.1 12/26/2021    Cardiac Studies & Procedures   CARDIAC CATHETERIZATION  CARDIAC CATHETERIZATION 12/10/2021  Narrative Severe stenosis of the right PLA branch, noted on previous cath studies Moderate nonobstructive stenosis of the mid-LAD with negative pressure analysis (0.96) Patent LCx with no stenosis Patent RCA stents with no significant restenosis  Plan: continue anti-angina Rx, medical treatment. The right PLA branch could be treated with PCI if pt has medically refractory angina.  Findings Coronary Findings Diagnostic  Dominance: Right  Left Main Vessel is large. Vessel is angiographically normal.  Left Anterior Descending Vessel is large. The LAD and LCx have separate ostia Mid LAD lesion is 55% stenosed. Dist LAD lesion is 40% stenosed.  First Diagonal Branch Vessel is small in size.  Second Diagonal Branch Vessel is small in size.  Third Diagonal Branch Vessel is small in size.  Left Circumflex Vessel is large. The vessel exhibits minimal luminal irregularities. The LAD and LCx have separate ostia  First Obtuse Marginal Branch Vessel is small in size.  Second Obtuse Marginal Branch Vessel is large in size. Non-stenotic 2nd Mrg lesion was previously treated. Previously placed stent displays no restenosis.  Third Obtuse Marginal Branch Vessel is moderate in size.  Right Coronary Artery Vessel is large. Non-stenotic Mid RCA to Dist RCA lesion was previously treated. Vessel is the culprit lesion. The lesion is thrombotic. Non-stenotic Dist RCA lesion was previously treated.  Right Posterior Descending Artery Vessel is moderate in size.  First Right Posterolateral Branch Vessel is small in size.  Second Right Posterolateral Branch Vessel is moderate in size. 2nd RPL lesion is 80%  stenosed.  Intervention  No interventions have been documented.   CARDIAC CATHETERIZATION  CARDIAC CATHETERIZATION 07/11/2019  Narrative Conclusions: 1. Multivessel coronary artery disease with acute occlusion of distal RCA (culprit lesion) as well as 50-60% mid LAD and 80% rPL2 stenoses. 2. Patent stent in OM2. 3. Moderately to severely elevated LVEDP (30-35 mmHg). 4. Successful primary PCI to the distal RCA using a Resolute Onyx 3.0 x38 mm DES with 0% residual stenosis and TIMI-3 flow.  Recommendations: 1. Admit to 2H-ICU for post-STEMI care. 2. Obtain transthoracic echocardiogram; ventriculogram was not performed due to significantly elevated LVEDP. 3. Dual antiplatelet therapy with aspirin and prasugrel for at least 12 months. 4. Aggressive secondary prevention.  Nelva Bush, MD [facility]  Findings Coronary Findings Diagnostic  Dominance: Right  Left Main Vessel is large. Vessel is angiographically normal.  Left Anterior Descending Vessel is large. Mid LAD lesion is 55% stenosed. Dist LAD lesion is 40% stenosed.  First Diagonal Branch Vessel is small in size.  Second Diagonal Branch Vessel is small in size.  Third Diagonal Branch Vessel is small in size.  Left Circumflex Vessel is large. The vessel exhibits minimal luminal irregularities.  First Obtuse Marginal Branch Vessel is small in size.  Second Obtuse Marginal Branch Vessel is large in size. Previously placed 2nd Mrg stent (unknown type) is widely patent. Previously placed stent displays no restenosis.  Third Obtuse Marginal Branch Vessel is moderate in size.  Right Coronary Artery Vessel is large. Mid RCA to Dist RCA lesion is 100% stenosed. Vessel is the culprit lesion. The lesion is thrombotic. Dist RCA lesion is 50% stenosed.  Right Posterior Descending Artery Vessel is moderate in size.  First Right Posterolateral Branch Vessel is small in size.  Second Right  Posterolateral Branch Vessel is moderate in size. 2nd RPL lesion is 80% stenosed.  Intervention  Mid RCA to [facility]  RCA lesion Stent (Also treats lesions: Dist RCA) CATHETER LAUNCHER 6FR JR4 guide catheter was inserted. Pre-stent angioplasty was performed using a BALLOON SAPPHIRE 2.5X12. A drug-eluting stent was successfully placed using a STENT RESOLUTE [id number]. Maximum pressure: 16 atm. Stent strut is well apposed. Post-stent angioplasty was performed using a BALLOON SAPPHIRE [place] 3.5X12. Maximum pressure:  18 atm. Post-Intervention Lesion Assessment The intervention was successful. Pre-interventional TIMI flow is 0. Post-intervention TIMI flow is 3. No complications occurred at this lesion. There is a 0% residual stenosis post intervention.  Dist RCA lesion Stent (Also treats lesions: Mid RCA to Dist RCA) See details in Mid RCA to Dist RCA lesion. Post-Intervention Lesion Assessment The intervention was successful. Pre-interventional TIMI flow is 0. Post-intervention TIMI flow is 3. No complications occurred at this lesion. There is a 0% residual stenosis post intervention.   STRESS TESTS  MYOCARDIAL PERFUSION IMAGING 08/25/2019  Narrative  Nuclear stress EF: 61%.  Findings consistent with ischemia.  This is a low risk study.  The left ventricular ejection fraction is normal (55-65%).  Small area of moderate ischemia in the inferior base LV appears enlarged with no RWMAls EF 61%   ECHOCARDIOGRAM  ECHOCARDIOGRAM LIMITED 06/05/2022  Narrative ECHOCARDIOGRAM LIMITED REPORT    Patient Name:   Jeffrey Winters Date of Exam: 06/05/2022 Medical Rec #:  [id number]      Height:       68.0 in Accession #:    [id number]     Weight:       231.0 lb Date of Birth:  Jun 25, 1966      BSA:          2.173 m Patient Age:    58 years       BP:           128/74 mmHg Patient Gender: M              HR:           78 bpm. Exam Location:  [facility]  Procedure: Limited Echo, Strain Analysis,  Limited Color Doppler and Cardiac Doppler  Indications:    R07.9 Chest Pain  History:        Patient has prior history of Echocardiogram examinations, most recent 12/10/2021. CAD and Previous Myocardial Infarction, PAD and COPD, Signs/Symptoms:Chest Pain; Risk Factors:Hypertension, Family History of Coronary Artery Disease, Dyslipidemia and Current Smoker.  Sonographer:    Deliah Boston RDCS Referring Phys: Blair Dolphin WEAVER  IMPRESSIONS   1. GLS borderline low -17%; however may not be as accurate with variable strain in different views. Left ventricular ejection fraction, by estimation, is 60 to 65%. The left ventricle has normal function. 2. Right ventricular systolic function is normal. The right ventricular size is normal. 3. Left atrial size was mildly dilated. 4. No evidence of mitral valve regurgitation. 5. The inferior vena cava is normal in size with greater than 50% respiratory variability, suggesting right atrial pressure of 3 mmHg.  FINDINGS Left Ventricle: GLS borderline low -17%; however may not be as accurate with variable strain in different views. Left ventricular ejection fraction, by estimation, is 60 to 65%. The left ventricle has normal function.  Right Ventricle: The right ventricular size is normal. Right ventricular systolic function is normal.  Left Atrium: Left atrial size was mildly dilated.  Right Atrium: Right atrial size was normal in size.  Pericardium: There is no evidence of pericardial effusion.  Tricuspid Valve: Tricuspid valve regurgitation is not demonstrated.  Venous: The inferior vena cava  is normal in size with greater than 50% respiratory variability, suggesting right atrial pressure of 3 mmHg.  IAS/Shunts: No atrial level shunt detected by color flow Doppler.  LEFT VENTRICLE PLAX 2D LVIDd:         5.40 cm   Diastology LVIDs:         3.30 cm   LV e' medial:    6.64 cm/s LV PW:         0.80 cm   LV E/e' medial:  11.7 LV IVS:         0.90 cm   LV e' lateral:   10.90 cm/s LVOT diam:     2.30 cm   LV E/e' lateral: 7.1 LV SV:         80 LV SV Index:   37        2D Longitudinal Strain LVOT Area:     4.15 cm  2D Strain GLS (A2C):   -15.6 % 2D Strain GLS (A3C):   -17.2 % 2D Strain GLS (A4C):   -19.4 % 2D Strain GLS Avg:     -17.4 %  RIGHT VENTRICLE RV Basal diam:  4.40 cm RV Mid diam:    3.40 cm RV S prime:     14.00 cm/s TAPSE (M-mode): 2.6 cm  LEFT ATRIUM             Index        RIGHT ATRIUM           Index LA diam:        4.20 cm 1.93 cm/m   RA Area:     19.20 cm LA Vol (A2C):   40.5 ml 18.64 ml/m  RA Volume:   52.40 ml  24.12 ml/m LA Vol (A4C):   30.4 ml 13.99 ml/m LA Biplane Vol: 35.9 ml 16.52 ml/m AORTIC VALVE LVOT Vmax:   106.50 cm/s LVOT Vmean:  69.200 cm/s LVOT VTI:    0.193 m  AORTA Ao Root diam: 3.10 cm Ao Asc diam:  3.10 cm  MITRAL VALVE MV Area (PHT): cm         SHUNTS MV Decel Time: 211 msec    Systemic VTI:  0.19 m MV E velocity: 77.60 cm/s  Systemic Diam: 2.30 cm MV A velocity: 71.70 cm/s MV E/A ratio:  1.08  Mary Scientist, physiological signed by Phineas Inches Signature Date/Time: 06/05/2022/10:31:04 AM    Final             Assessment & Plan    1.  Coronary artery disease: -s/p NSTEMI in 2017 treated with DES to OM1, repeat NSTEMI 05/2016, STEMI 2020 with DES to RCA repeat LHC performed 12/2021 -Today patient reports resolution of chest pain with increased Imdur dose -He is currently chest pain-free today  -Continue current GDMT with atorvastatin 80 mg, Toprol 25 mg, ASA 81 mg, Plavix 75 mg daily, Imdur 90 mg daily   2.  Essential hypertension: -Patient's blood pressure today was well-controlled at 122/64 -Continue current antihypertensive regimen with Toprol XL 25 mg daily   3.  Hyperlipidemia: -Patient's last LDL was 58 at goal of less than 70 -Patient was advised to decrease his consumption of red meats and saturated fats. -Continue current GDMT with Lipitor 80 mg  daily   4.  Peripheral artery disease: -s/p LE Art [place] 08/2019: R SFA 50-74; L SFA 50-74 // s/p R SFA PTA in 4/21; s/p L SFA PTA -Today patient reports increased swelling in his right lower extremity and  intermittent claudication with ambulation. -Currently followed by Dr.Berry and patient advised to schedule an appointment for follow-up  5.  Tobacco abuse: -Patient currently smoking 1 pack/day down from 2 packs/day -He was offered information on cessation with 1 800 quit now  Disposition: Follow-up with Sherren Mocha, MD or APP in 6 months    Medication Adjustments/Labs and Tests Ordered: Current medicines are reviewed at length with the patient today.  Concerns regarding medicines are outlined above.   Signed, Mable Fill, Marissa Nestle, NP 10/07/2022, 7:49 PM [facility]  Note:  This document was prepared using Dragon voice recognition software and may include unintentional dictation errors.

## 2022-10-09 ENCOUNTER — Encounter: Payer: Self-pay | Admitting: Nurse Practitioner

## 2022-10-09 ENCOUNTER — Ambulatory Visit: Payer: Medicaid Other | Attending: Nurse Practitioner | Admitting: Nurse Practitioner

## 2022-10-09 VITALS — BP 122/64 | HR 55 | Ht 68.0 in | Wt 237.0 lb

## 2022-10-09 DIAGNOSIS — I739 Peripheral vascular disease, unspecified: Secondary | ICD-10-CM | POA: Diagnosis not present

## 2022-10-09 DIAGNOSIS — I25119 Atherosclerotic heart disease of native coronary artery with unspecified angina pectoris: Secondary | ICD-10-CM | POA: Diagnosis not present

## 2022-10-09 DIAGNOSIS — I10 Essential (primary) hypertension: Secondary | ICD-10-CM | POA: Diagnosis not present

## 2022-10-09 DIAGNOSIS — Z720 Tobacco use: Secondary | ICD-10-CM

## 2022-10-09 DIAGNOSIS — E785 Hyperlipidemia, unspecified: Secondary | ICD-10-CM

## 2022-10-09 NOTE — Patient Instructions (Addendum)
Medication Instructions:  Your physician recommends that you continue on your current medications as directed. Please refer to the Current Medication list given to you today. *If you need a refill on your cardiac medications before your next appointment, please call your pharmacy*   Lab Work: NONE ORDERED   Testing/Procedures: NONE ORDERED   Follow-Up: At [facility], you and your health needs are our priority.  As part of our continuing mission to provide you with exceptional heart care, we have created designated Provider Care Teams.  These Care Teams include your primary Cardiologist (physician) and Advanced Practice Providers (APPs -  Physician Assistants and Nurse Practitioners) who all work together to provide you with the care you need, when you need it.  We recommend signing up for the patient portal called "MyChart".  Sign up information is provided on this After Visit Summary.  MyChart is used to connect with patients for Virtual Visits (Telemedicine).  Patients are able to view lab/test results, encounter notes, upcoming appointments, etc.  Non-urgent messages can be sent to your provider as well.   To learn more about what you can do with MyChart, go to [email or web address].    Your next appointment:   6 month(s)  Provider:   Ambrose Pancoast, NP       PLEASE SCHEDULE A FOLLOW UP WITH DR Gwenlyn Found Other Instructions CALL [phone]

## 2022-12-01 ENCOUNTER — Ambulatory Visit: Payer: Medicaid Other | Admitting: Podiatry

## 2022-12-01 DIAGNOSIS — M79675 Pain in left toe(s): Secondary | ICD-10-CM

## 2022-12-01 DIAGNOSIS — M79674 Pain in right toe(s): Secondary | ICD-10-CM | POA: Diagnosis not present

## 2022-12-01 DIAGNOSIS — Z79899 Other long term (current) drug therapy: Secondary | ICD-10-CM

## 2022-12-01 DIAGNOSIS — B351 Tinea unguium: Secondary | ICD-10-CM | POA: Diagnosis not present

## 2022-12-01 DIAGNOSIS — D492 Neoplasm of unspecified behavior of bone, soft tissue, and skin: Secondary | ICD-10-CM

## 2022-12-01 NOTE — Progress Notes (Signed)
Subjective: Chief Complaint  Patient presents with   Nail Problem    RM 12  RFC bilateral nail trim. Nails are thick and discolored. Difficult to cut.    Plantar Warts    Plantar wart Right foot lesion. Pt requested debridement or removal of wart.     57 year old male presents the office today for evaluation of skin lesion, wart on the right foot as well as for nail fungus.  States that he is doing well.  His main concern is he has a skin lesion, wart on the right foot causing pain.     Objective: AAO x3, NAD DP, PT pulse 2/4 on the right and the foot is warm, and well perfused.  Nails appear to be unchanged. They remain hypertrophic, dystrophic with yellow discoloration.  There is no pain in the nails there is no swelling or redness or any signs of infection. Hyperkeratotic lesion present on the plantar aspect of the right foot.  No underlying ulceration drainage or any signs of infection to rest No open lesions or pre-ulcerative lesions.  No pain with calf compression, swelling, warmth, erythema  Assessment: Skin lesion left foot, onychomycosis  Plan: -All treatment options discussed with the patient including all alternatives, risks, complications.  -Sharply debrided the nails x 10 without any complications or bleeding. Discussed restarting Lamisil which he would like to do. Will recheck CBC and LFT.  -Sharply debrided the hyperkeratotic lesions to any complications on the plantar right foot.  ABI is actually normal on the right side.  I cleaned the area and Cantharone Plus was applied followed by an occlusive bandage.  Postprocedure instructions discussed.  Monitor for any signs or symptoms of infection.  If no improvement consider surgical excision.  Vivi Barrack DPM

## 2022-12-01 NOTE — Patient Instructions (Signed)
Take dressing off in 8 hours and wash the foot with soap and water. If it is hurting or becomes uncomfortable before the 8 hours, go ahead and remove the bandage and wash the area.  If it blisters, apply antibiotic ointment and a band-aid.  Monitor for any signs/symptoms of infection. Call the office immediately if any occur or go directly to the emergency room. Call with any questions/concerns.  --  Terbinafine Tablets What is this medication? TERBINAFINE (TER bin a feen) treats fungal infections of the nails. It belongs to a group of medications called antifungals. It will not treat infections caused by bacteria or viruses. This medicine may be used for other purposes; ask your health care provider or pharmacist if you have questions. COMMON BRAND NAME(S): Lamisil, Terbinex What should I tell my care team before I take this medication? They need to know if you have any of these conditions: Liver disease An unusual or allergic reaction to terbinafine, other medications, foods, dyes, or preservatives Pregnant or trying to get pregnant Breast-feeding How should I use this medication? Take this medication by mouth with water. Take it as directed on the prescription label at the same time every day. You can take it with or without food. If it upsets your stomach, take it with food. Keep taking it unless your care team tells you to stop. A special MedGuide will be given to you by the pharmacist with each prescription and refill. Be sure to read this information carefully each time. Talk to your care team regarding the use of this medication in children. Special care may be needed. Overdosage: If you think you have taken too much of this medicine contact a poison control center or emergency room at once. NOTE: This medicine is only for you. Do not share this medicine with others. What if I miss a dose? If you miss a dose, take it as soon as you can unless it is more than 4 hours late. If it is  more than 4 hours late, skip the missed dose. Take the next dose at the normal time. What may interact with this medication? Do not take this medication with any of the following: Pimozide Thioridazine This medication may also interact with the following: Beta blockers Caffeine Certain medications for mental health conditions Cimetidine Cyclosporine Medications for fungal infections like fluconazole and ketoconazole Medications for irregular heartbeat like amiodarone, flecainide and propafenone Rifampin Warfarin This list may not describe all possible interactions. Give your health care provider a list of all the medicines, herbs, non-prescription drugs, or dietary supplements you use. Also tell them if you smoke, drink alcohol, or use illegal drugs. Some items may interact with your medicine. What should I watch for while using this medication? Visit your care team for regular checks on your progress. You may need blood work while you are taking this medication. It may be some time before you see the benefit from this medication. This medication may cause serious skin reactions. They can happen weeks to months after starting the medication. Contact your care team right away if you notice fevers or flu-like symptoms with a rash. The rash may be red or purple and then turn into blisters or peeling of the skin. Or, you might notice a red rash with swelling of the face, lips or lymph nodes in your neck or under your arms. This medication can make you more sensitive to the sun. Keep out of the sun, If you cannot avoid being in the sun,  wear protective clothing and sunscreen. Do not use sun lamps or tanning beds/booths. What side effects may I notice from receiving this medication? Side effects that you should report to your care team as soon as possible: Allergic reactions--skin rash, itching, hives, swelling of the face, lips, tongue, or throat Change in sense of smell Change in  taste Infection--fever, chills, cough, or sore throat Liver injury--right upper belly pain, loss of appetite, nausea, light-colored stool, dark yellow or brown urine, yellowing skin or eyes, unusual weakness or fatigue Low red blood cell level--unusual weakness or fatigue, dizziness, headache, trouble breathing Lupus-like syndrome--joint pain, swelling, or stiffness, butterfly-shaped rash on the face, rashes that get worse in the sun, fever, unusual weakness or fatigue Rash, fever, and swollen lymph nodes Redness, blistering, peeling, or loosening of the skin, including inside the mouth Unusual bruising or bleeding Worsening mood, feelings of depression Side effects that usually do not require medical attention (report to your care team if they continue or are bothersome): Diarrhea Gas Headache Nausea Stomach pain Upset stomach This list may not describe all possible side effects. Call your doctor for medical advice about side effects. You may report side effects to FDA at 1-800-FDA-1088. Where should I keep my medication? Keep out of the reach of children and pets. Store between 20 and 25 degrees C (68 and 77 degrees F). Protect from light. Get rid of any unused medication after the expiration date. To get rid of medications that are no longer needed or have expired: Take the medication to a medication take-back program. Check with your pharmacy or law enforcement to find a location. If you cannot return the medication, check the label or package insert to see if the medication should be thrown out in the garbage or flushed down the toilet. If you are not sure, ask your care team. If it is safe to put it in the trash, take the medication out of the container. Mix the medication with cat litter, dirt, coffee grounds, or other unwanted substance. Seal the mixture in a bag or container. Put it in the trash. NOTE: This sheet is a summary. It may not cover all possible information. If you have  questions about this medicine, talk to your doctor, pharmacist, or health care provider.  2023 Elsevier/Gold Standard (2021-02-11 00:00:00)

## 2023-01-06 IMAGING — CT CT MAXILLOFACIAL W/ CM
3 series · 16 of 47 positions shown, 19 images · IV contrast (omnipaque)
Comparison: 05/05/2020

CLINICAL DATA: Facial swelling

EXAM:
CT MAXILLOFACIAL WITH CONTRAST
TECHNIQUE: Multidetector CT imaging of the maxillofacial structures was
performed with intravenous contrast. Multiplanar CT image
reconstructions were also generated.
CONTRAST:  75mL OMNIPAQUE IOHEXOL 300 MG/ML  SOLN

[Series 3: facialbone 2.0 st · axial · 0.38mm/px · z∈[-227,-83]mm · 10 of 84 slices shown, 13 images]
[im 6/84  brain]
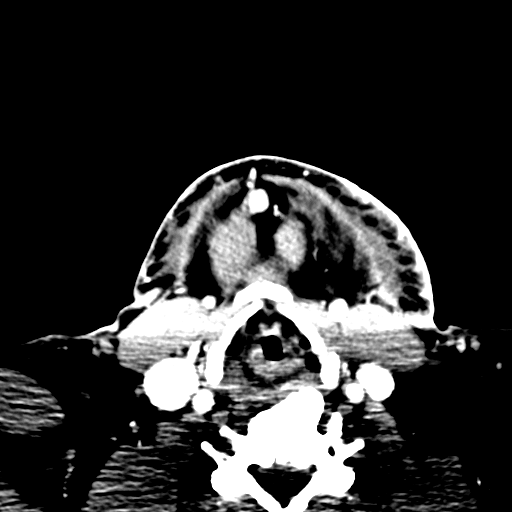
[im 6/84  bone]
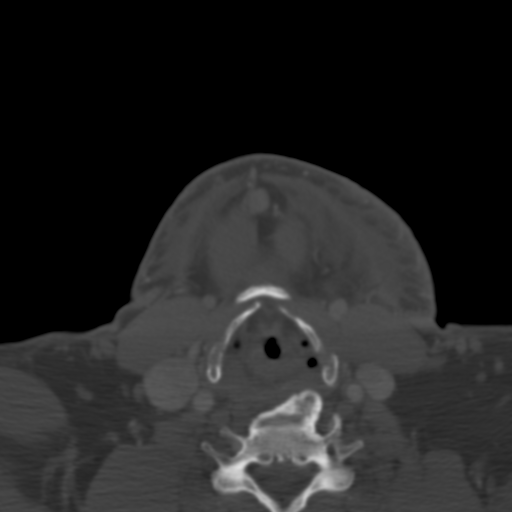
[im 15/84  bone]
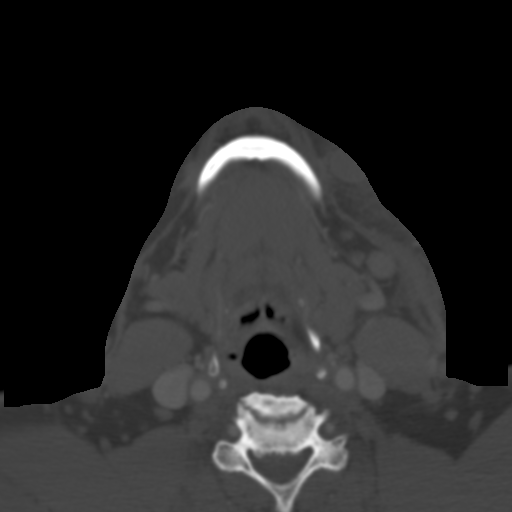
[im 23/84  bone]
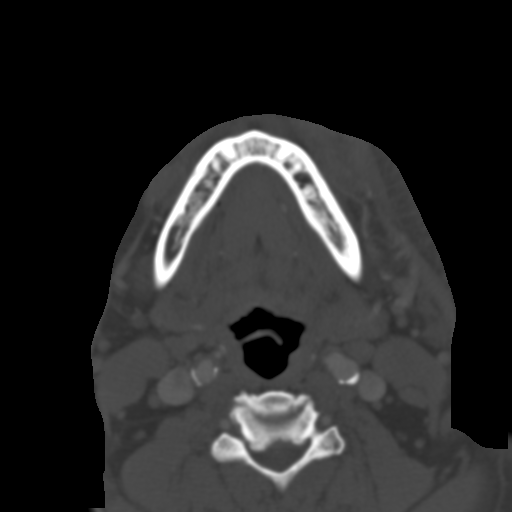
[im 29/84  bone]
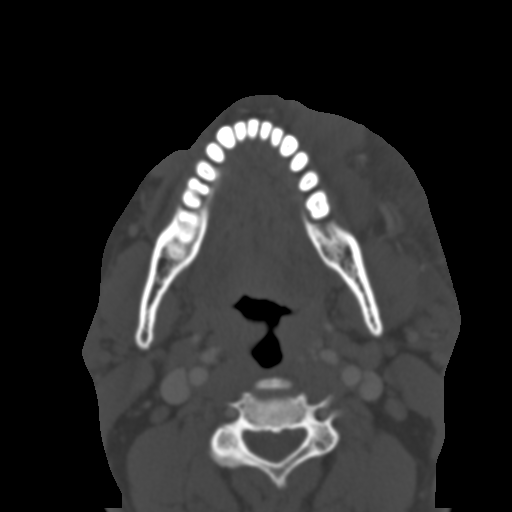
[im 38/84  brain]
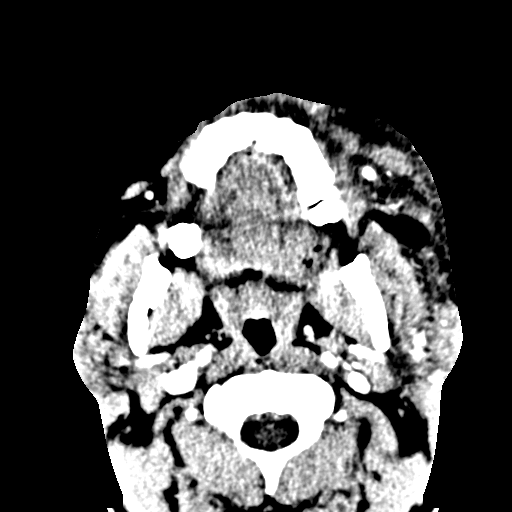
[im 38/84  bone]
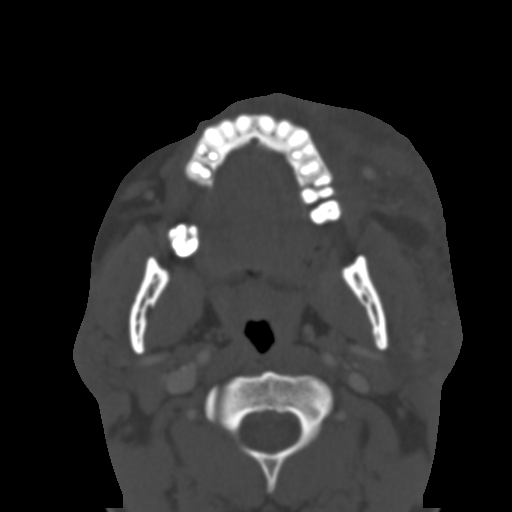
[im 46/84  bone]
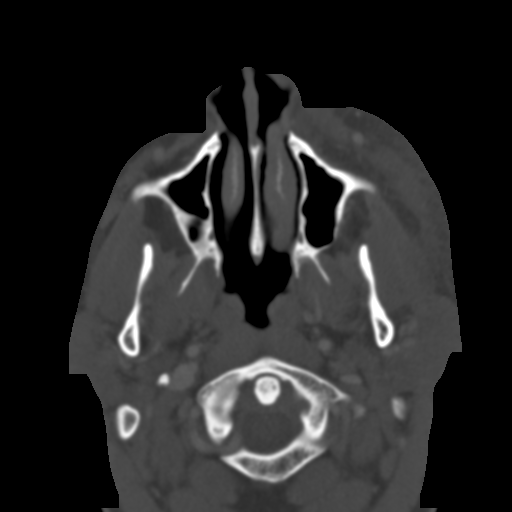
[im 55/84  bone]
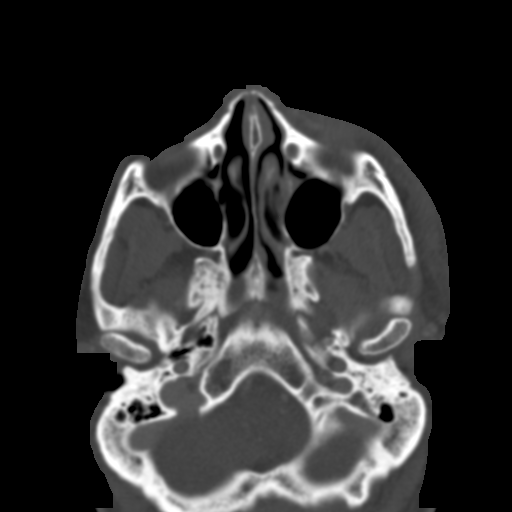
[im 63/84  bone]
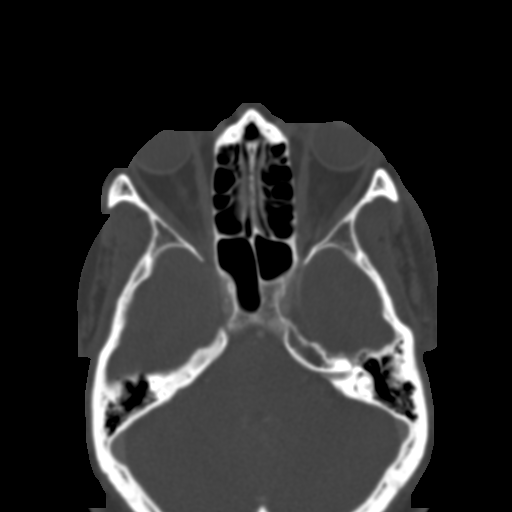
[im 69/84  brain]
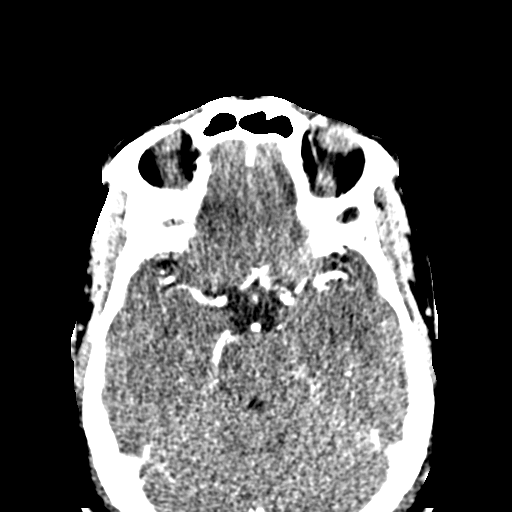
[im 69/84  bone]
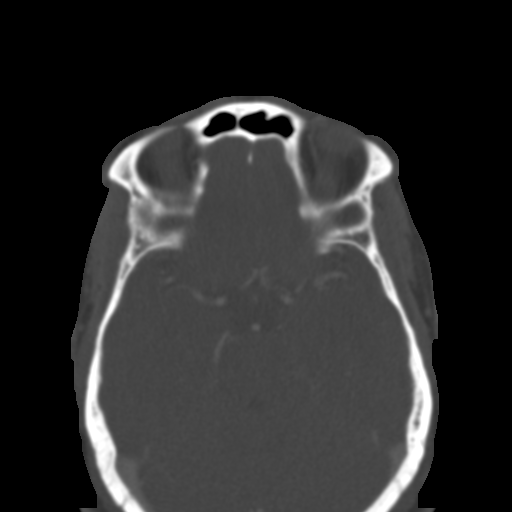
[im 78/84  bone]
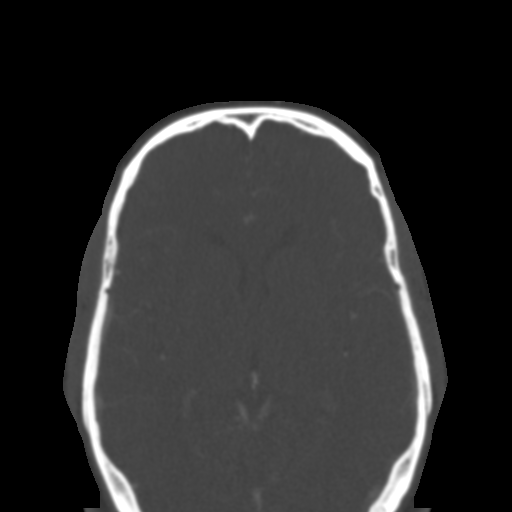

[Series 7: facialbone 2.0 cor st · coronal · 0.35mm/px · 3 of 89 slices shown]
[im 30/89  bone]
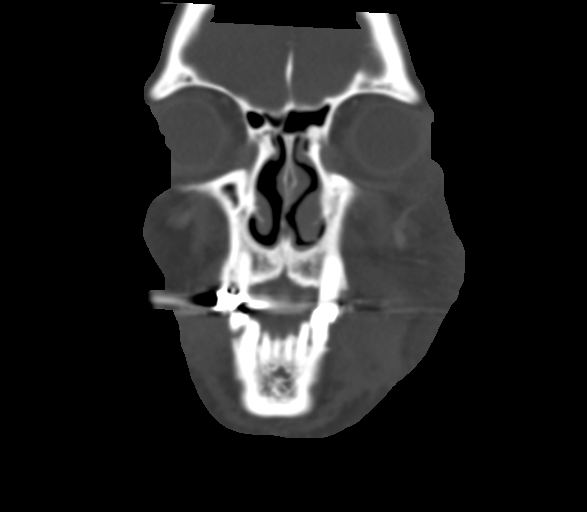
[im 40/89  bone]
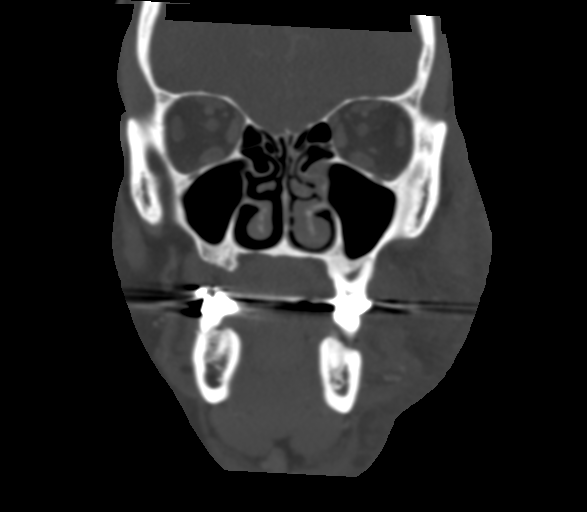
[im 49/89  bone]
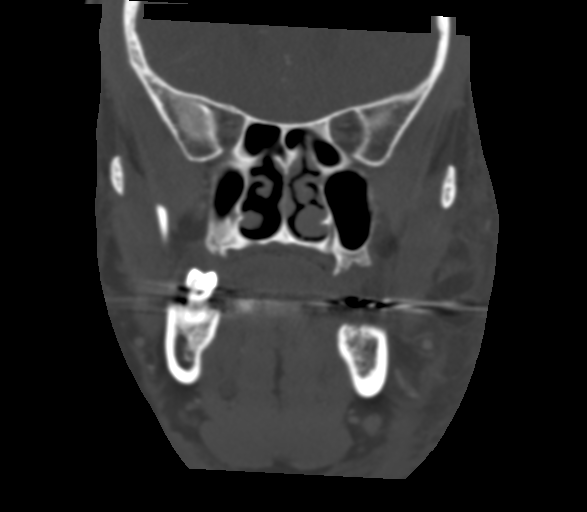

[Series 8: facialbone 2.0 sag st · sagittal · 0.36mm/px · 3 of 92 slices shown]
[im 31/92  bone]
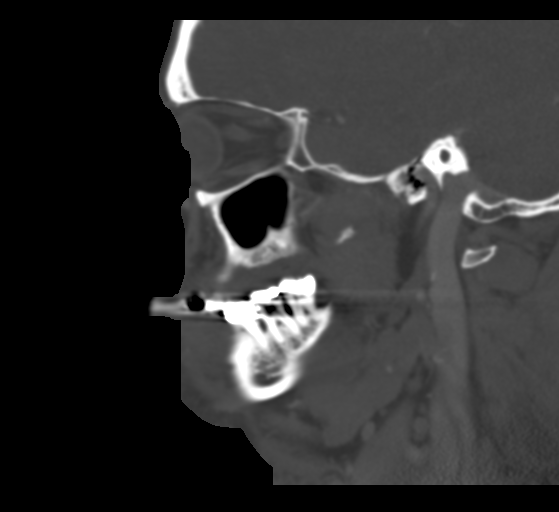
[im 46/92  bone]
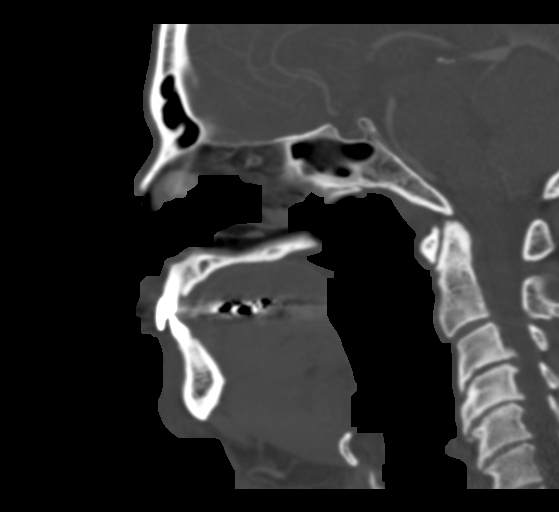
[im 61/92  bone]
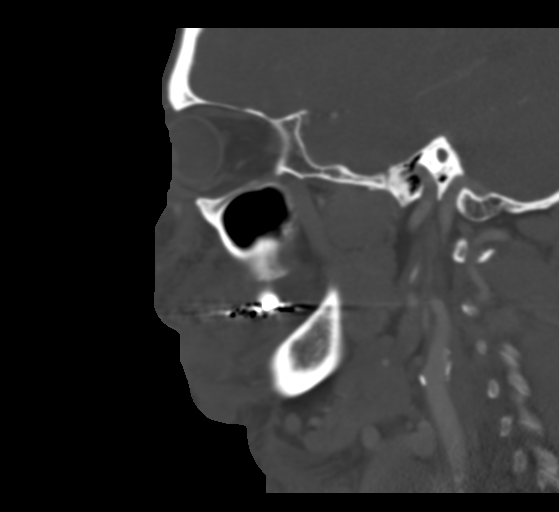

[16 of 47 positions shown; findings below may reference images not displayed]

FINDINGS: Osseous: Periapical lucency at the root of tooth 18. No acute
fracture.

Orbits: Negative. No traumatic or inflammatory finding.

Sinuses: Clear.

Soft tissues: Left facial soft tissue swelling and inflammation.
Small subcutaneous fluid collection measuring 7 mm ([DATE]). More
inferiorly, there is a second subcutaneous collection measuring 15
mm ([DATE]). There is thickening of the left platysma. There are
multiple reactive left submandibular lymph nodes.

Limited intracranial: Normal
IMPRESSION: 1. Left facial cellulitis with 2 small subcutaneous collections,
measuring up to 15 mm. These are favored to be inclusion cyst but
the possibility of infection remains.
2. Periapical lucency at the root of tooth 18. This is near the area
of inflammation but does not appear to be the source.

## 2023-02-05 ENCOUNTER — Ambulatory Visit: Payer: Medicaid Other | Admitting: Cardiovascular Disease

## 2023-02-11 ENCOUNTER — Ambulatory Visit: Payer: Self-pay | Admitting: Nurse Practitioner

## 2023-02-19 ENCOUNTER — Ambulatory Visit (HOSPITAL_COMMUNITY)
Admission: RE | Admit: 2023-02-19 | Discharge: 2023-02-19 | Disposition: A | Discharge status: Home / Self Care | Payer: 59 | Source: Ambulatory Visit | Attending: Cardiovascular Disease | Admitting: Cardiovascular Disease

## 2023-02-19 DIAGNOSIS — I739 Peripheral vascular disease, unspecified: Secondary | ICD-10-CM | POA: Diagnosis not present

## 2023-02-19 LAB — VAS US ABI WITH/WO TBI
Left ABI: 0.99
Right ABI: 1.03

## 2023-02-24 ENCOUNTER — Other Ambulatory Visit: Payer: Self-pay | Admitting: *Deleted

## 2023-02-24 ENCOUNTER — Ambulatory Visit: Payer: 59 | Admitting: Physician Assistant

## 2023-02-24 ENCOUNTER — Encounter: Payer: Self-pay | Admitting: *Deleted

## 2023-02-24 VITALS — BP 130/60 | HR 62 | Ht 68.0 in | Wt 231.2 lb

## 2023-02-24 DIAGNOSIS — E785 Hyperlipidemia, unspecified: Secondary | ICD-10-CM

## 2023-02-24 DIAGNOSIS — I739 Peripheral vascular disease, unspecified: Secondary | ICD-10-CM

## 2023-02-24 DIAGNOSIS — M25561 Pain in right knee: Secondary | ICD-10-CM | POA: Diagnosis not present

## 2023-02-24 DIAGNOSIS — I10 Essential (primary) hypertension: Secondary | ICD-10-CM

## 2023-02-24 DIAGNOSIS — Z720 Tobacco use: Secondary | ICD-10-CM

## 2023-02-24 DIAGNOSIS — I2510 Atherosclerotic heart disease of native coronary artery without angina pectoris: Secondary | ICD-10-CM | POA: Diagnosis not present

## 2023-02-24 DIAGNOSIS — J449 Chronic obstructive pulmonary disease, unspecified: Secondary | ICD-10-CM

## 2023-02-24 NOTE — Patient Instructions (Addendum)
Medication Instructions:  Your physician recommends that you continue on your current medications as directed. Please refer to the Current Medication list given to you today.  *If you need a refill on your cardiac medications before your next appointment, please call your pharmacy*   Lab Work: WITHIN THE NEXT MONTH:  FASTING LIPID & LFT.  Just come to the lab, any day after 8:00 a.m. FASTING  If you have labs (blood work) drawn today and your tests are completely normal, you will receive your results only by: MyChart Message (if you have MyChart) OR A paper copy in the mail If you have any lab test that is abnormal or we need to change your treatment, we will call you to review the results.   Testing/Procedures: Your physician has requested that you have a lower extremity arterial exercise duplex with ABI's in 1 year.. During this test, exercise and ultrasound are used to evaluate arterial blood flow in the legs. Allow one hour for this exam. There are no restrictions or special instructions.  They will call you to arrange.    Follow-Up: At [facility], you and your health needs are our priority.  As part of our continuing mission to provide you with exceptional heart care, we have created designated Provider Care Teams.  These Care Teams include your primary Cardiologist (physician) and Advanced Practice Providers (APPs -  Physician Assistants and Nurse Practitioners) who all work together to provide you with the care you need, when you need it.  We recommend signing up for the patient portal called "MyChart".  Sign up information is provided on this After Visit Summary.  MyChart is used to connect with patients for Virtual Visits (Telemedicine).  Patients are able to view lab/test results, encounter notes, upcoming appointments, etc.  Non-urgent messages can be sent to your provider as well.   To learn more about what you can do with MyChart, go to [email or web address].     Your next appointment:   As scheduled  Provider:   All providerse     Other Instructions  Contact your Orthopedic DR regarding your right knee discomfort

## 2023-02-24 NOTE — Progress Notes (Signed)
Cardiology Office Note:  .   Date:  02/24/2023  ID:  Jeffrey Winters, DOB [birth year], MRN [id number] PCP: Ivonne Andrew, NP  [facility] Providers Cardiologist:  Tonny Bollman, MD Cardiology APP:  Kennon Rounds  PV Cardiologist:  Nanetta Batty, MD     History of Present Illness: .   Jeffrey Winters is a 57 y.o. male with past medical history of CAD, PAD s/p left SFA and the right SFA, hypertension, hyperlipidemia, tobacco use and COPD.  Patient had a inferior STEMI in December 2020.  Cardiac catheterization revealed multivessel CAD with occluded distal RCA treated with DES, 50 to 60% mid LAD and 80% PL 2 residual.  TTE showed EF 55% with LVH, grade 1 DD, normal valve.  Myoview in January 2021 showed small amount of inferior ischemia.  He was managed medically.  He was referred to Dr. Gery Pray for calf pain.  He underwent left SFA PTA in March 2021 and the balloon angioplasty of the right SFA in April 2021.  Repeat cardiac catheterization in May 2023 showed severe stenosis of the right PLA branch was moderate nonobstructive mid LAD lesion.  TTE at that time showed EF 55%, normal LV function and RV function.  During the visit in October 2023, he complained of exertional anginal symptom.  Antianginal medication was uptitrated.  TTE obtained in November 2023 to rule out wall motion abnormality which showed normal EF.  His Imdur was further increased.  He was recently seen by Robin Searing NP on 10/09/2022, he has not had any further chest discomfort.  He did continue to report increased claudication and the swelling of the right lower extremity.  He was still smoking 1 pack/day.    Since the last visit, patient underwent ABI and the LVA on 02/19/2023.  ABI was normal.  Alyea showed 1 to 49% stenosis in distal SFA stent, 50 to 99% stenosis with triphasic blood flow distal to the previously placed right SFA stent.  Otherwise, velocity in distal SFA and proximal popliteal artery is still around 150  to 160 cm/s.  Patient does have increased velocity of 375 cm/s in the left distal SFA with 50 to 74% stenosis, however he does not have any left lower extremity claudication.  Talking with the patient today, he has pain is primarily in the right knee.  It is worse when he stands up or try to climb stairs.  He has great lower extremity pulse.  I suspect his symptom is more related to orthopedic issue rather than blood flow issue.  I asked him to call [facility] who he has seen in the past.  He denies any exertional chest pain, shortness of breath, lower extremity edema, orthopnea or PND.   ROS:   He denies chest pain, palpitations, dyspnea, pnd, orthopnea, n, v, dizziness, syncope, edema, weight gain, or early satiety. All other systems reviewed and are otherwise negative except as noted above.    Studies Reviewed: .        Cardiac Studies & Procedures   CARDIAC CATHETERIZATION  CARDIAC CATHETERIZATION 12/10/2021  Narrative Severe stenosis of the right PLA branch, noted on previous cath studies Moderate nonobstructive stenosis of the mid-LAD with negative pressure analysis (0.96) Patent LCx with no stenosis Patent RCA stents with no significant restenosis  Plan: continue anti-angina Rx, medical treatment. The right PLA branch could be treated with PCI if pt has medically refractory angina.  Findings Coronary Findings Diagnostic  Dominance: Right  Left Main Vessel  is large. Vessel is angiographically normal.  Left Anterior Descending Vessel is large. The LAD and LCx have separate ostia Mid LAD lesion is 55% stenosed. Dist LAD lesion is 40% stenosed.  First Diagonal Branch Vessel is small in size.  Second Diagonal Branch Vessel is small in size.  Third Diagonal Branch Vessel is small in size.  Left Circumflex Vessel is large. The vessel exhibits minimal luminal irregularities. The LAD and LCx have separate ostia  First Obtuse Marginal Branch Vessel is small in  size.  Second Obtuse Marginal Branch Vessel is large in size. Non-stenotic 2nd Mrg lesion was previously treated. Previously placed stent displays no restenosis.  Third Obtuse Marginal Branch Vessel is moderate in size.  Right Coronary Artery Vessel is large. Non-stenotic Mid RCA to Dist RCA lesion was previously treated. Vessel is the culprit lesion. The lesion is thrombotic. Non-stenotic Dist RCA lesion was previously treated.  Right Posterior Descending Artery Vessel is moderate in size.  First Right Posterolateral Branch Vessel is small in size.  Second Right Posterolateral Branch Vessel is moderate in size. 2nd RPL lesion is 80% stenosed.  Intervention  No interventions have been documented.   CARDIAC CATHETERIZATION  CARDIAC CATHETERIZATION 07/10/2019  Narrative Conclusions: 1. Multivessel coronary artery disease with acute occlusion of distal RCA (culprit lesion) as well as 50-60% mid LAD and 80% rPL2 stenoses. 2. Patent stent in OM2. 3. Moderately to severely elevated LVEDP (30-35 mmHg). 4. Successful primary PCI to the distal RCA using a Resolute Onyx 3.0 x38 mm DES with 0% residual stenosis and TIMI-3 flow.  Recommendations: 1. Admit to 2H-ICU for post-STEMI care. 2. Obtain transthoracic echocardiogram; ventriculogram was not performed due to significantly elevated LVEDP. 3. Dual antiplatelet therapy with aspirin and prasugrel for at least 12 months. 4. Aggressive secondary prevention.  Yvonne Kendall, MD [facility]  Findings Coronary Findings Diagnostic  Dominance: Right  Left Main Vessel is large. Vessel is angiographically normal.  Left Anterior Descending Vessel is large. Mid LAD lesion is 55% stenosed. Dist LAD lesion is 40% stenosed.  First Diagonal Branch Vessel is small in size.  Second Diagonal Branch Vessel is small in size.  Third Diagonal Branch Vessel is small in size.  Left Circumflex Vessel is large. The vessel  exhibits minimal luminal irregularities.  First Obtuse Marginal Branch Vessel is small in size.  Second Obtuse Marginal Branch Vessel is large in size. Previously placed 2nd Mrg stent (unknown type) is widely patent. Previously placed stent displays no restenosis.  Third Obtuse Marginal Branch Vessel is moderate in size.  Right Coronary Artery Vessel is large. Mid RCA to Dist RCA lesion is 100% stenosed. Vessel is the culprit lesion. The lesion is thrombotic. Dist RCA lesion is 50% stenosed.  Right Posterior Descending Artery Vessel is moderate in size.  First Right Posterolateral Branch Vessel is small in size.  Second Right Posterolateral Branch Vessel is moderate in size. 2nd RPL lesion is 80% stenosed.  Intervention  Mid RCA to Dist RCA lesion Stent (Also treats lesions: Dist RCA) CATHETER LAUNCHER 6FR JR4 guide catheter was inserted. Pre-stent angioplasty was performed using a BALLOON SAPPHIRE 2.5X12. A drug-eluting stent was successfully placed using a STENT RESOLUTE [id number]. Maximum pressure: 16 atm. Stent strut is well apposed. Post-stent angioplasty was performed using a BALLOON SAPPHIRE Delia 3.5X12. Maximum pressure:  18 atm. Post-Intervention Lesion Assessment The intervention was successful. Pre-interventional TIMI flow is 0. Post-intervention TIMI flow is 3. No complications occurred at this lesion. There is a 0% residual stenosis  post intervention.  Dist RCA lesion Stent (Also treats lesions: Mid RCA to Dist RCA) See details in Mid RCA to Dist RCA lesion. Post-Intervention Lesion Assessment The intervention was successful. Pre-interventional TIMI flow is 0. Post-intervention TIMI flow is 3. No complications occurred at this lesion. There is a 0% residual stenosis post intervention.   STRESS TESTS  MYOCARDIAL PERFUSION IMAGING 08/25/2019  Narrative  Nuclear stress EF: 61%.  Findings consistent with ischemia.  This is a low risk study.  The left  ventricular ejection fraction is normal (55-65%).  Small area of moderate ischemia in the inferior base LV appears enlarged with no RWMAls EF 61%   ECHOCARDIOGRAM  ECHOCARDIOGRAM LIMITED 06/05/2022  Narrative ECHOCARDIOGRAM LIMITED REPORT    Patient Name:   JERMANIE MINSHALL Date of Exam: 06/05/2022 Medical Rec #:  [id number]      Height:       68.0 in Accession #:    [id number]     Weight:       231.0 lb Date of Birth:  10-14-1965      BSA:          2.173 m Patient Age:    56 years       BP:           128/74 mmHg Patient Gender: M              HR:           78 bpm. Exam Location:  [address]  Procedure: Limited Echo, Strain Analysis, Limited Color Doppler and Cardiac Doppler  Indications:    R07.9 Chest Pain  History:        Patient has prior history of Echocardiogram examinations, most recent 12/10/2021. CAD and Previous Myocardial Infarction, PAD and COPD, Signs/Symptoms:Chest Pain; Risk Factors:Hypertension, Family History of Coronary Artery Disease, Dyslipidemia and Current Smoker.  Sonographer:    Farrel Conners RDCS Referring Phys: Evern Bio WEAVER  IMPRESSIONS   1. GLS borderline low -17%; however may not be as accurate with variable strain in different views. Left ventricular ejection fraction, by estimation, is 60 to 65%. The left ventricle has normal function. 2. Right ventricular systolic function is normal. The right ventricular size is normal. 3. Left atrial size was mildly dilated. 4. No evidence of mitral valve regurgitation. 5. The inferior vena cava is normal in size with greater than 50% respiratory variability, suggesting right atrial pressure of 3 mmHg.  FINDINGS Left Ventricle: GLS borderline low -17%; however may not be as accurate with variable strain in different views. Left ventricular ejection fraction, by estimation, is 60 to 65%. The left ventricle has normal function.  Right Ventricle: The right ventricular size is normal. Right ventricular  systolic function is normal.  Left Atrium: Left atrial size was mildly dilated.  Right Atrium: Right atrial size was normal in size.  Pericardium: There is no evidence of pericardial effusion.  Tricuspid Valve: Tricuspid valve regurgitation is not demonstrated.  Venous: The inferior vena cava is normal in size with greater than 50% respiratory variability, suggesting right atrial pressure of 3 mmHg.  IAS/Shunts: No atrial level shunt detected by color flow Doppler.  LEFT VENTRICLE PLAX 2D LVIDd:         5.40 cm   Diastology LVIDs:         3.30 cm   LV e' medial:    6.64 cm/s LV PW:         0.80 cm   LV E/e' medial:  11.7 LV  IVS:        0.90 cm   LV e' lateral:   10.90 cm/s LVOT diam:     2.30 cm   LV E/e' lateral: 7.1 LV SV:         80 LV SV Index:   37        2D Longitudinal Strain LVOT Area:     4.15 cm  2D Strain GLS (A2C):   -15.6 % 2D Strain GLS (A3C):   -17.2 % 2D Strain GLS (A4C):   -19.4 % 2D Strain GLS Avg:     -17.4 %  RIGHT VENTRICLE RV Basal diam:  4.40 cm RV Mid diam:    3.40 cm RV S prime:     14.00 cm/s TAPSE (M-mode): 2.6 cm  LEFT ATRIUM             Index        RIGHT ATRIUM           Index LA diam:        4.20 cm 1.93 cm/m   RA Area:     19.20 cm LA Vol (A2C):   40.5 ml 18.64 ml/m  RA Volume:   52.40 ml  24.12 ml/m LA Vol (A4C):   30.4 ml 13.99 ml/m LA Biplane Vol: 35.9 ml 16.52 ml/m AORTIC VALVE LVOT Vmax:   106.50 cm/s LVOT Vmean:  69.200 cm/s LVOT VTI:    0.193 m  AORTA Ao Root diam: 3.10 cm Ao Asc diam:  3.10 cm  MITRAL VALVE MV Area (PHT): cm         SHUNTS MV Decel Time: 211 msec    Systemic VTI:  0.19 m MV E velocity: 77.60 cm/s  Systemic Diam: 2.30 cm MV A velocity: 71.70 cm/s MV E/A ratio:  1.08  Photographer signed by Carolan Clines Signature Date/Time: 06/05/2022/10:31:04 AM    Final              Risk Assessment/Calculations:             Physical Exam:   VS:  BP 130/60   Pulse 62   Ht 5\' 8"  (1.727  m)   Wt 231 lb 3.2 oz (104.9 kg)   SpO2 96%   BMI 35.15 kg/m    Wt Readings from Last 3 Encounters:  02/24/23 231 lb 3.2 oz (104.9 kg)  10/09/22 237 lb (107.5 kg)  08/13/22 235 lb 3.2 oz (106.7 kg)    GEN: Well nourished, well developed in no acute distress NECK: No JVD; No carotid bruits CARDIAC: RRR, no murmurs, rubs, gallops RESPIRATORY:  Clear to auscultation without rales, wheezing or rhonchi  ABDOMEN: Soft, non-tender, non-distended EXTREMITIES:  No edema; No deformity   ASSESSMENT AND PLAN: .    Right knee pain: Symptom is more consistent with arthritic knee pain rather than lower extremity claudication symptom.  Recent ABI and LEA were reassuring.  No further workup.  Recommend patient follow-up with [facility].  CAD: Denies any recent chest pain.  On aspirin and Lipitor  PAD: Recent AKI and LEA showed patent distal SFA stent.  Plan to repeat study next year.  Hypertension: Blood pressure stable  Hyperlipidemia: Continue Lipitor.  Obtain CMP and fasting lipid panel  Tobacco abuse: Tobacco cessation needed to prevent progression of CAD and PAD  COPD: No acute exacerbation.       Dispo: Follow-up with orthopedic service regarding right knee pain.  Keep follow-up with Dr. Allyson Sabal and Robin Searing PA-C  Signed,  Azalee Course, [place]

## 2023-02-26 ENCOUNTER — Other Ambulatory Visit: Payer: Self-pay | Admitting: Nurse Practitioner

## 2023-02-26 DIAGNOSIS — F419 Anxiety disorder, unspecified: Secondary | ICD-10-CM

## 2023-03-02 ENCOUNTER — Ambulatory Visit: Payer: 59 | Admitting: Podiatry

## 2023-03-08 ENCOUNTER — Other Ambulatory Visit: Payer: Self-pay | Admitting: Nurse Practitioner

## 2023-03-08 DIAGNOSIS — F419 Anxiety disorder, unspecified: Secondary | ICD-10-CM

## 2023-03-08 NOTE — Telephone Encounter (Signed)
Please advise KH 

## 2023-03-09 ENCOUNTER — Ambulatory Visit (INDEPENDENT_AMBULATORY_CARE_PROVIDER_SITE_OTHER): Payer: 59 | Admitting: Physician Assistant

## 2023-03-09 ENCOUNTER — Other Ambulatory Visit: Payer: Self-pay

## 2023-03-09 ENCOUNTER — Encounter: Payer: Self-pay | Admitting: Physician Assistant

## 2023-03-09 DIAGNOSIS — M25561 Pain in right knee: Secondary | ICD-10-CM | POA: Diagnosis not present

## 2023-03-09 MED ORDER — BUPIVACAINE HCL 0.25 % IJ SOLN
2.0000 mL | INTRAMUSCULAR | Status: AC | PRN
  Administered 2023-03-09: 2 mL via INTRA_ARTICULAR

## 2023-03-09 MED ORDER — METHYLPREDNISOLONE ACETATE 40 MG/ML IJ SUSP
40.0000 mg | INTRAMUSCULAR | Status: AC | PRN
  Administered 2023-03-09: 40 mg via INTRA_ARTICULAR

## 2023-03-09 MED ORDER — LIDOCAINE HCL 1 % IJ SOLN
2.0000 mL | INTRAMUSCULAR | Status: AC | PRN
  Administered 2023-03-09: 2 mL

## 2023-03-09 NOTE — Progress Notes (Signed)
Office Visit Note   Patient: Jeffrey Winters           Date of Birth: 09/25/1965           MRN: [id number] Visit Date: 03/09/2023              Requested by: Ivonne Andrew, NP [phone] ,  [place] [address] PCP: Ivonne Andrew, NP   Assessment & Plan: Visit Diagnoses: Right knee pain  Plan: Patient is a pleasant 57 year old gentleman with a history of right knee pain and mild arthritis.  He has has had an injection in the past which lasted him quite a long while.  He has had no new injury but admits may have been on his feet a bit more.  Denies any fever or chills.  Not having any locking or catching.  Would recommend trying a steroid injection as this has been helpful in the past.  Also gave him information about topical Voltaren.  Because of his cardiac issues probably should not be on long-term nonsteroidal anti-inflammatories we will follow-up as needed  Follow-Up Instructions: Return if symptoms worsen or fail to improve.   Orders:  Orders Placed This Encounter  Procedures   XR Knee 1-2 Views Right   No orders of the defined types were placed in this encounter.     Procedures: Large Joint Inj: R knee on 03/09/2023 4:02 PM Indications: pain and diagnostic evaluation Details: 25 G 1.5 in needle  Arthrogram: No  Medications: 40 mg methylPREDNISolone acetate 40 MG/ML; 2 mL lidocaine 1 %; 2 mL bupivacaine 0.25 % Outcome: tolerated well, no immediate complications Procedure, treatment alternatives, risks and benefits explained, specific risks discussed. Consent was given by the patient.     Clinical Data: No additional findings.   Subjective: Chief Complaint  Patient presents with   Right Knee - Pain    HPI patient is a pleasant 57 year old gentleman with a history of mild right knee pain with some mild arthritis.  No new injury no fever or chills just thinks his knee is painful denies any particular injury rates his pain is moderate  plus  Review of Systems  All other systems reviewed and are negative.    Objective: Vital Signs: There were no vitals taken for this visit.  Physical Exam Constitutional:      Appearance: Normal appearance.  Pulmonary:     Effort: Pulmonary effort is normal.  Skin:    General: Skin is warm and dry.  Neurological:     General: No focal deficit present.     Mental Status: He is alert and oriented to person, place, and time.  Psychiatric:        Mood and Affect: Mood normal.        Behavior: Behavior normal.    Ortho Exam Right knee: No effusion no redness no erythema.  Compartments are soft and compressible he is neurovascularly intact.  He does have pain over the medial greater than lateral joint line with positive grind on range of motion Specialty Comments:  No specialty comments available.  Imaging: No results found.   PMFS History: Patient Active Problem List   Diagnosis Date Noted   Dry skin 08/13/2022   Dental abscess 03/11/2022   Radiculopathy, cervical region 01/13/2022   Cervical disc disorder with radiculopathy, unspecified cervical region 01/13/2022   Chronic right shoulder pain 01/08/2022   Right arm pain 12/31/2021   Health care maintenance 12/26/2021   CAD (coronary  artery disease) 07/08/2021   Anxiety 03/13/2021   CHF (congestive heart failure) (HCC) 03/13/2021   Depression 03/13/2021   GERD (gastroesophageal reflux disease) 03/13/2021   Obesity 03/13/2021   Tobacco dependence 03/13/2021   Facial cellulitis 11/25/2020   Essential hypertension 11/25/2020   COPD (chronic obstructive pulmonary disease) (HCC) 11/25/2020   Tobacco abuse 11/20/2020   Influenza vaccine refused 05/29/2020   PAD (peripheral artery disease) (HCC)    Hyperlipidemia LDL goal <70 07/12/2019   Old MI (myocardial infarction) 07/10/2019   Obstructive sleep apnea hypopnea, moderate 05/15/2016   Atherosclerotic heart disease 05/14/2016   Alcohol abuse 02/20/2016   Bradycardia  02/20/2016   Past Medical History:  Diagnosis Date   COPD (chronic obstructive pulmonary disease) (HCC)    Coronary artery disease 2017   MI s/p PCI to OM2 // s/p MI 07/2019 >> DES to RCA; residual RPL2 80 // Myoview 08/2019: EF 61, small area of mod ischemia inf base; Low Risk // Cath 5/23: RCA and OM1 stents ok, mod dz LAD neg RFR, RPLA 80 - med Rx unless refractory angina   Hyperlipidemia LDL goal <70 07/12/2019   Hyperlipidemia   Hypertension    MI (myocardial infarction) (HCC)    PAD (peripheral artery disease) (HCC)    LE Art [place] 08/2019: R SFA 50-74; L SFA 50-74 // s/p R SFA PTA in 4/21; s/p L SFA PTA in 3/21 (Dr. Allyson Sabal)   Tobacco use     Family History  Problem Relation Age of Onset   Heart disease Mother    Heart disease Father     Past Surgical History:  Procedure Laterality Date   ABDOMINAL AORTOGRAM W/LOWER EXTREMITY Bilateral 10/05/2019   Procedure: ABDOMINAL AORTOGRAM W/LOWER EXTREMITY;  Surgeon: Runell Gess, MD;  Location: [facility];  Service: Cardiovascular;  Laterality: Bilateral;   ABDOMINAL AORTOGRAM W/LOWER EXTREMITY N/A 11/09/2019   Procedure: ABDOMINAL AORTOGRAM W/LOWER EXTREMITY;  Surgeon: Runell Gess, MD;  Location: [facility];  Service: Cardiovascular;  Laterality: N/A;   CORONARY PRESSURE/FFR STUDY N/A 12/10/2021   Procedure: INTRAVASCULAR PRESSURE WIRE/FFR STUDY;  Surgeon: Tonny Bollman, MD;  Location: [facility] INVASIVE CV LAB;  Service: Cardiovascular;  Laterality: N/A;   CORONARY/GRAFT ACUTE MI REVASCULARIZATION N/A 07/10/2019   Procedure: CORONARY/GRAFT ACUTE MI REVASCULARIZATION;  Surgeon: Yvonne Kendall, MD;  Location: [facility];  Service: Cardiovascular;  Laterality: N/A;   LEFT HEART CATH AND CORONARY ANGIOGRAPHY N/A 07/10/2019   Procedure: LEFT HEART CATH AND CORONARY ANGIOGRAPHY;  Surgeon: Yvonne Kendall, MD;  Location: [facility];  Service: Cardiovascular;  Laterality: N/A;   LEFT HEART CATH AND CORONARY  ANGIOGRAPHY N/A 12/10/2021   Procedure: LEFT HEART CATH AND CORONARY ANGIOGRAPHY;  Surgeon: Tonny Bollman, MD;  Location: [facility] INVASIVE CV LAB;  Service: Cardiovascular;  Laterality: N/A;   PERCUTANEOUS CORONARY STENT INTERVENTION (PCI-S)  2017   DES to OM2 at [facility]   PERIPHERAL INTRAVASCULAR LITHOTRIPSY  11/09/2019   Procedure: INTRAVASCULAR LITHOTRIPSY;  Surgeon: Runell Gess, MD;  Location: [facility] INVASIVE CV LAB;  Service: Cardiovascular;;   PERIPHERAL VASCULAR ATHERECTOMY Left 10/05/2019   Procedure: PERIPHERAL VASCULAR ATHERECTOMY;  Surgeon: Runell Gess, MD;  Location: [facility] INVASIVE CV LAB;  Service: Cardiovascular;  Laterality: Left;  SFA   PERIPHERAL VASCULAR BALLOON ANGIOPLASTY  11/09/2019   Procedure: PERIPHERAL VASCULAR BALLOON ANGIOPLASTY;  Surgeon: Runell Gess, MD;  Location: [facility];  Service: Cardiovascular;;   PERIPHERAL VASCULAR INTERVENTION Left 10/05/2019   Procedure: PERIPHERAL  VASCULAR INTERVENTION;  Surgeon: Runell Gess, MD;  Location: [facility]AS[facility] CV LAB;  Service: Cardiovascular;  Laterality: Left;  SFA   Social History   Occupational History   Not on file  Tobacco Use   Smoking status: Every Day    Current packs/day: 1.00    Average packs/day: 1 pack/day for 7.0 years (7.0 ttl pk-yrs)    Types: Cigarettes   Smokeless tobacco: Never   Tobacco comments:    smokes 1/2 pack per day 06/19/2020  Vaping Use   Vaping status: Never Used  Substance and Sexual Activity   Alcohol use: Not Currently   Drug use: No   Sexual activity: Not Currently

## 2023-03-22 ENCOUNTER — Ambulatory Visit: Payer: 59 | Admitting: Podiatry

## 2023-03-22 DIAGNOSIS — M79674 Pain in right toe(s): Secondary | ICD-10-CM | POA: Diagnosis not present

## 2023-03-22 DIAGNOSIS — M79675 Pain in left toe(s): Secondary | ICD-10-CM

## 2023-03-22 DIAGNOSIS — B351 Tinea unguium: Secondary | ICD-10-CM | POA: Diagnosis not present

## 2023-03-22 DIAGNOSIS — I739 Peripheral vascular disease, unspecified: Secondary | ICD-10-CM

## 2023-03-22 NOTE — Progress Notes (Signed)
Subjective: Chief Complaint  Patient presents with   Nail Problem    Nail fungus follow up     57 year old male presents the office today for evaluation of skin lesion, wart on the right foot as well as for nail fungus.  States he was doing well the Lamisil asking about a refill.  No swelling redness or drainage of the toenail sites.  Arterial duplex 02/19/2023:  Summary:  Right: 50-74% stenosis noted in the superficial femoral artery. Patent Rt  mid SFA stent.   Left: 50-74% stenosis noted in the superficial femoral artery. Patent Lt  mid-distal SFA stent.    Objective: AAO x3, NAD DP, PT pulse 2/4 on the right and the foot is warm, and well perfused bilaterally. General strengthening hypertrophic, dystrophic with yellow discoloration over there is some clearing of the proximal nail folds.  There is no pain in the nails there is no swelling or redness or any signs of infection. Hyperkeratotic lesion present on the plantar aspect of the right foot.  Small dried blood but there is no underlying ulceration drainage or signs of infection. No open lesions or pre-ulcerative lesions.  No pain with calf compression, swelling, warmth, erythema  Assessment: Skin lesion left foot, onychomycosis  Plan: -All treatment options discussed with the patient including all alternatives, risks, complications.  -Sharply debrided the nails x 10 without any complications or bleeding.  He was continuing with soap.  He is getting blood work done next week and already given the LFT which is ordered.  Once he gets his blood work to let me know and normal I will send over the medication. -Debrided hyperkeratotic lesion with any complications or bleeding.  Moisturizer, offloading.  Return in about 3 months (around 06/22/2023) for nail fungus.  Vivi Barrack DPM

## 2023-03-22 NOTE — Patient Instructions (Signed)
Terbinafine Tablets What is this medication? TERBINAFINE (TER bin a feen) treats fungal infections of the nails. It belongs to a group of medications called antifungals. It will not treat infections caused by bacteria or viruses. This medicine may be used for other purposes; ask your health care provider or pharmacist if you have questions. COMMON BRAND NAME(S): Lamisil, Terbinex What should I tell my care team before I take this medication? They need to know if you have any of these conditions: Liver disease An unusual or allergic reaction to terbinafine, other medications, foods, dyes, or preservatives Pregnant or trying to get pregnant Breast-feeding How should I use this medication? Take this medication by mouth with water. Take it as directed on the prescription label at the same time every day. You can take it with or without food. If it upsets your stomach, take it with food. Keep taking it unless your care team tells you to stop. A special MedGuide will be given to you by the pharmacist with each prescription and refill. Be sure to read this information carefully each time. Talk to your care team regarding the use of this medication in children. Special care may be needed. Overdosage: If you think you have taken too much of this medicine contact a poison control center or emergency room at once. NOTE: This medicine is only for you. Do not share this medicine with others. What if I miss a dose? If you miss a dose, take it as soon as you can unless it is more than 4 hours late. If it is more than 4 hours late, skip the missed dose. Take the next dose at the normal time. What may interact with this medication? Do not take this medication with any of the following: Pimozide Thioridazine This medication may also interact with the following: Beta blockers Caffeine Certain medications for mental health conditions Cimetidine Cyclosporine Medications for fungal infections like fluconazole  and ketoconazole Medications for irregular heartbeat like amiodarone, flecainide and propafenone Rifampin Warfarin This list may not describe all possible interactions. Give your health care provider a list of all the medicines, herbs, non-prescription drugs, or dietary supplements you use. Also tell them if you smoke, drink alcohol, or use illegal drugs. Some items may interact with your medicine. What should I watch for while using this medication? Visit your care team for regular checks on your progress. You may need blood work while you are taking this medication. It may be some time before you see the benefit from this medication. This medication may cause serious skin reactions. They can happen weeks to months after starting the medication. Contact your care team right away if you notice fevers or flu-like symptoms with a rash. The rash may be red or purple and then turn into blisters or peeling of the skin. Or, you might notice a red rash with swelling of the face, lips or lymph nodes in your neck or under your arms. This medication can make you more sensitive to the sun. Keep out of the sun, If you cannot avoid being in the sun, wear protective clothing and sunscreen. Do not use sun lamps or tanning beds/booths. What side effects may I notice from receiving this medication? Side effects that you should report to your care team as soon as possible: Allergic reactions--skin rash, itching, hives, swelling of the face, lips, tongue, or throat Change in sense of smell Change in taste Infection--fever, chills, cough, or sore throat Liver injury--right upper belly pain, loss of appetite, nausea,   light-colored stool, dark yellow or brown urine, yellowing skin or eyes, unusual weakness or fatigue Low red blood cell level--unusual weakness or fatigue, dizziness, headache, trouble breathing Lupus-like syndrome--joint pain, swelling, or stiffness, butterfly-shaped rash on the face, rashes that get worse  in the sun, fever, unusual weakness or fatigue Rash, fever, and swollen lymph nodes Redness, blistering, peeling, or loosening of the skin, including inside the mouth Unusual bruising or bleeding Worsening mood, feelings of depression Side effects that usually do not require medical attention (report to your care team if they continue or are bothersome): Diarrhea Gas Headache Nausea Stomach pain Upset stomach This list may not describe all possible side effects. Call your doctor for medical advice about side effects. You may report side effects to FDA at 1-800-FDA-1088. Where should I keep my medication? Keep out of the reach of children and pets. Store between 20 and 25 degrees C (68 and 77 degrees F). Protect from light. Get rid of any unused medication after the expiration date. To get rid of medications that are no longer needed or have expired: Take the medication to a medication take-back program. Check with your pharmacy or law enforcement to find a location. If you cannot return the medication, check the label or package insert to see if the medication should be thrown out in the garbage or flushed down the toilet. If you are not sure, ask your care team. If it is safe to put it in the trash, take the medication out of the container. Mix the medication with cat litter, dirt, coffee grounds, or other unwanted substance. Seal the mixture in a bag or container. Put it in the trash. NOTE: This sheet is a summary. It may not cover all possible information. If you have questions about this medicine, talk to your doctor, pharmacist, or health care provider.  2024 Elsevier/Gold Standard (2021-03-05 00:00:00)  

## 2023-04-05 NOTE — Progress Notes (Deleted)
Cardiology Office Note    Patient Name: [name] Date of Encounter: 04/05/2023  Primary Care Provider:  Ivonne Andrew, NP Primary Cardiologist:  Tonny Bollman, MD Primary Electrophysiologist: None   Past Medical History    Past Medical History:  Diagnosis Date   COPD (chronic obstructive pulmonary disease) (HCC)    Coronary artery disease 2017   MI s/p PCI to OM2 // s/p MI 07/2019 >> DES to RCA; residual RPL2 80 // Myoview 08/2019: EF 61, small area of mod ischemia inf base; Low Risk // Cath 5/23: RCA and OM1 stents ok, mod dz LAD neg RFR, RPLA 80 - med Rx unless refractory angina   Hyperlipidemia LDL goal <70 07/12/2019   Hyperlipidemia   Hypertension    MI (myocardial infarction) (HCC)    PAD (peripheral artery disease) (HCC)    LE Art [place] 08/2019: R SFA 50-74; L SFA 50-74 // s/p R SFA PTA in 4/21; s/p L SFA PTA in 3/21 (Dr. Allyson Sabal)   Tobacco use     History of Present Illness  Jeffrey Winters is a 57 y.o. male with a PMH of***   During today's visit the patient reports they are ***.  Patient denies chest pain, palpitations, dyspnea, PND, orthopnea, nausea, vomiting, dizziness, syncope, edema, weight gain, or early satiety.  ***Notes:  Review of Systems  Please see the history of present illness.    All other systems reviewed and are otherwise negative except as noted above.  Physical Exam    Wt Readings from Last 3 Encounters:  02/24/23 231 lb 3.2 oz (104.9 kg)  10/09/22 237 lb (107.5 kg)  08/13/22 235 lb 3.2 oz (106.7 kg)   ZO:[id number] were no vitals filed for this visit.,There is no height or weight on file to calculate BMI. GEN: Well nourished, well developed in no acute distress Neck: No JVD; No carotid bruits Pulmonary: Clear to auscultation without rales, wheezing or rhonchi  Cardiovascular: Normal rate. Regular rhythm. Normal S1. Normal S2.   Murmurs: There is no murmur.  ABDOMEN: Soft, non-tender, non-distended EXTREMITIES:  No edema; No deformity    EKG/LABS/ Recent Cardiac Studies   ECG personally reviewed by me today - ***  Risk Assessment/Calculations:   {Does this patient have ATRIAL FIBRILLATION?:[id number]}      Lab Results  Component Value Date   WBC 4.0 08/13/2022   HGB 14.3 08/13/2022   HCT 44.1 08/13/2022   MCV 87 08/13/2022   PLT 159 08/13/2022   Lab Results  Component Value Date   CREATININE 0.91 08/13/2022   BUN 13 08/13/2022   NA 141 08/13/2022   K 4.7 08/13/2022   CL 103 08/13/2022   CO2 25 08/13/2022   Lab Results  Component Value Date   CHOL 122 03/12/2022   HDL 45 03/12/2022   LDLCALC 50 03/12/2022   TRIG 162 (H) 03/12/2022   CHOLHDL 2.7 03/12/2022    Lab Results  Component Value Date   HGBA1C 6.1 (A) 12/26/2021   HGBA1C 6.1 12/26/2021   HGBA1C 6.1 12/26/2021   HGBA1C 6.1 12/26/2021   Assessment & Plan    1.***  2.***  3.***  4.***      Disposition: Follow-up with Tonny Bollman, MD or APP in *** months {Are you ordering a CV Procedure (e.g. stress test, cath, DCCV, TEE, etc)?   Press F2        :[id number]}   Signed, Napoleon Form, Leodis Rains, NP 04/05/2023, 3:17 PM [facility]

## 2023-04-07 ENCOUNTER — Ambulatory Visit: Payer: 59 | Admitting: Nurse Practitioner

## 2023-04-13 ENCOUNTER — Other Ambulatory Visit: Payer: Self-pay | Admitting: Nurse Practitioner

## 2023-04-13 ENCOUNTER — Other Ambulatory Visit: Payer: Self-pay | Admitting: Cardiovascular Disease

## 2023-04-13 DIAGNOSIS — I2510 Atherosclerotic heart disease of native coronary artery without angina pectoris: Secondary | ICD-10-CM

## 2023-04-13 DIAGNOSIS — I10 Essential (primary) hypertension: Secondary | ICD-10-CM

## 2023-04-13 DIAGNOSIS — E782 Mixed hyperlipidemia: Secondary | ICD-10-CM

## 2023-06-06 ENCOUNTER — Other Ambulatory Visit: Payer: Self-pay | Admitting: Physician Assistant

## 2023-06-07 ENCOUNTER — Encounter: Payer: Self-pay | Admitting: Cardiovascular Disease

## 2023-06-07 ENCOUNTER — Ambulatory Visit: Payer: 59 | Attending: Cardiovascular Disease | Admitting: Cardiovascular Disease

## 2023-06-07 VITALS — BP 130/66 | HR 59 | Ht 67.0 in | Wt 239.0 lb

## 2023-06-07 DIAGNOSIS — I739 Peripheral vascular disease, unspecified: Secondary | ICD-10-CM

## 2023-06-07 DIAGNOSIS — I252 Old myocardial infarction: Secondary | ICD-10-CM

## 2023-06-07 DIAGNOSIS — I25119 Atherosclerotic heart disease of native coronary artery with unspecified angina pectoris: Secondary | ICD-10-CM

## 2023-06-07 DIAGNOSIS — I10 Essential (primary) hypertension: Secondary | ICD-10-CM | POA: Diagnosis not present

## 2023-06-07 DIAGNOSIS — E785 Hyperlipidemia, unspecified: Secondary | ICD-10-CM | POA: Diagnosis not present

## 2023-06-07 NOTE — Patient Instructions (Addendum)
Lab Work: FASTING Lipid and Liver panel. If you have labs (blood work) drawn today and your tests are completely normal, you will receive your results only by: MyChart Message (if you have MyChart) OR A paper copy in the mail If you have any lab test that is abnormal or we need to change your treatment, we will call you to review the results.   Testing/Procedures:  Your physician has requested that you have a lower  extremity arterial duplex. SCHEDULE IN JULY 2025.This test is an ultrasound of the arteries in the legs or arms. It looks at arterial blood flow in the legs and arms. Allow one hour for Lower and Upper Arterial scans. There are no restrictions or special instructions.  Please note: We ask at that you not bring children with you during ultrasound (echo/ vascular) testing. Due to room size and safety concerns, children are not allowed in the ultrasound rooms during exams. Our front office staff cannot provide observation of children in our lobby area while testing is being conducted. An adult accompanying a patient to their appointment will only be allowed in the ultrasound room at the discretion of the ultrasound technician under special circumstances. We apologize for any inconvenience.  Your physician has requested that you have an ankle brachial index (ABI). During this test an ultrasound and blood pressure cuff are used to evaluate the arteries that supply the arms and legs with blood. Allow thirty minutes for this exam. There are no restrictions or special instructions.  Please note: We ask at that you not bring children with you during ultrasound (echo/ vascular) testing. Due to room size and safety concerns, children are not allowed in the ultrasound rooms during exams. Our front office staff cannot provide observation of children in our lobby area while testing is being conducted. An adult accompanying a patient to their appointment will only be allowed in the ultrasound room at the  discretion of the ultrasound technician under special circumstances. We apologize for any inconvenience.   Follow-Up: At [facility], you and your health needs are our priority.  As part of our continuing mission to provide you with exceptional heart care, we have created designated Provider Care Teams.  These Care Teams include your primary Cardiologist (physician) and Advanced Practice Providers (APPs -  Physician Assistants and Nurse Practitioners) who all work together to provide you with the care you need, when you need it.  We recommend signing up for the patient portal called "MyChart".  Sign up information is provided on this After Visit Summary.  MyChart is used to connect with patients for Virtual Visits (Telemedicine).  Patients are able to view lab/test results, encounter notes, upcoming appointments, etc.  Non-urgent messages can be sent to your provider as well.   To learn more about what you can do with MyChart, go to [email or web address].    Your next appointment:   12 month(s)  Provider:   Nanetta Batty MD

## 2023-06-07 NOTE — Assessment & Plan Note (Signed)
History of peripheral arterial disease status post left SFA intervention with stenting 3//2021 with staged right SFA intervention 11/09/19 using shockwave lithotripsy followed by [facility].  He still has mild claudication.  His most recent Dopplers performed 02/09/2023 revealed a right ABI of 1.03 and a left of 0.99.  Did have elevated velocities in his distal left SFA unchanged from his last Doppler study.  Will continue to follow him noninvasively.

## 2023-06-07 NOTE — Progress Notes (Signed)
06/07/2023 Jeffrey Winters   June 30, 1966  [id number]  Primary Physician Ivonne Andrew, NP Primary Cardiologist: Runell Gess MD FACP, [place], [place], [place]  HPI:  Jeffrey Winters is a 57 y.o.  mild to moderately overweight single African-American male with no children referred to me by Tereso Newcomer, PA-C for treatment of symptomatic PAD.  He does not have a primary care provider.  I last saw him in the office 06/02/2022.  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  He currently does not work and is disabled.  I last saw him in the office 08/29/2019. His risk factors include 20 to 30 pack years of tobacco abuse continue to smoke 1 pack/day, treated hypertension hyperlipidemia.  There is no family history of heart disease.  He is never had a stroke.  He had a non-STEMI in December and had treatment of his distal RCA.  He had stenting of his distal RCA and obtuse marginal branch at [facility] 02/26/2016 as well.  He does complain of left Solomon claudication at 50 to 75 feet which is symmetric bilaterally.  He had Doppler studies performed 08/09/2019 revealing normal ABIs with moderate disease in both SFAs.   I performed peripheral angiography on him 10/05/2019 revealing high-grade fairly focal mid bilateral SFA stenoses.  I performed [facility] 1 directional atherectomy followed by drug-coated balloon angioplasty of the mid left SFA with an excellent result.  His Dopplers normalized and his left calf claudication has resolved.    I performed right SFA intervention on him 11/09/2019 using intravascular lithotripsy followed by drug-coated balloon angioplasty.  His follow-up Doppler studies performed 11/16/2019 showed no change in the frequency mid right SFA or change in his ABIs although clinically he is improved.  Unfortunately, he does continue to smoke a half a pack a day.   Since I saw him a year ago he continues to remain stable from a PAD point of view.   He does have some mild claudication.  His most recent Doppler studies performed 02/19/2023 revealed a right ABI of 1.03 and a left of 0.99 with elevated velocities in his distal left SFA unchanged from his past Doppler studies.   Current Meds  Medication Sig   ammonium lactate (AMLACTIN) 12 % lotion Apply 1 Application topically as needed for dry skin.   ANORO ELLIPTA 62.5-25 MCG/ACT AEPB INHALE 1 PUFF INTO THE LUNGS DAILY AT 6 (SIX) AM.   aspirin EC 81 MG tablet Take 1 tablet orally daily   atorvastatin (LIPITOR) 80 MG tablet TAKE 1 TABLET BY MOUTH EVERY DAY   buPROPion (WELLBUTRIN XL) 150 MG 24 hr tablet TAKE 1 TABLET BY MOUTH EVERYDAY AT BEDTIME   clopidogrel (PLAVIX) 75 MG tablet TAKE 1 TABLET BY MOUTH EVERY DAY   escitalopram (LEXAPRO) 10 MG tablet TAKE 1 TABLET BY MOUTH EVERY DAY   famotidine (PEPCID) 20 MG tablet Take 1 tablet by mouth 2 times daily   fluconazole (DIFLUCAN) 150 MG tablet Take 1 tablet (150 mg total) by mouth once a week.   isosorbide mononitrate (IMDUR) 60 MG 24 hr tablet TAKE 1.5 TABLETS (90 MG TOTAL) BY MOUTH DAILY.   metoprolol succinate (TOPROL-XL) 25 MG 24 hr tablet Take 1 tablet (25 mg total) by mouth daily.   nitroGLYCERIN (NITROSTAT) 0.4 MG SL tablet Take 1 tablet sublingually every 5 minutes as needed for chest pain     No Known Allergies  Social History  Socioeconomic History   Marital status: Single    Spouse name: Not on file   Number of children: Not on file   Years of education: Not on file   Highest education level: Not on file  Occupational History   Not on file  Tobacco Use   Smoking status: Every Day    Current packs/day: 1.00    Average packs/day: 1 pack/day for 7.0 years (7.0 ttl pk-yrs)    Types: Cigarettes   Smokeless tobacco: Never   Tobacco comments:    smokes 1/2 pack per day 06/19/2020  Vaping Use   Vaping status: Never Used  Substance and Sexual Activity   Alcohol use: Not Currently   Drug use: No   Sexual activity: Not  Currently  Other Topics Concern   Not on file  Social History Narrative   Not on file   Social Determinants of Health   Financial Resource Strain: Not on file  Food Insecurity: Not on file  Transportation Needs: Not on file  Physical Activity: Not on file  Stress: Not on file  Social Connections: Not on file  Intimate Partner Violence: Not on file     Review of Systems: General: negative for chills, fever, night sweats or weight changes.  Cardiovascular: negative for chest pain, dyspnea on exertion, edema, orthopnea, palpitations, paroxysmal nocturnal dyspnea or shortness of breath Dermatological: negative for rash Respiratory: negative for cough or wheezing Urologic: negative for hematuria Abdominal: negative for nausea, vomiting, diarrhea, bright red blood per rectum, melena, or hematemesis Neurologic: negative for visual changes, syncope, or dizziness All other systems reviewed and are otherwise negative except as noted above.    Blood pressure 130/66, pulse (!) 59, height 5\' 7"  (1.702 m), weight 239 lb (108.4 kg).  General appearance: alert and no distress Neck: no adenopathy, no carotid bruit, no JVD, supple, symmetrical, trachea midline, and thyroid not enlarged, symmetric, no tenderness/mass/nodules Lungs: clear to auscultation bilaterally Heart: regular rate and rhythm, S1, S2 normal, no murmur, click, rub or gallop Extremities: extremities normal, atraumatic, no cyanosis or edema Pulses: 2+ and symmetric Skin: Skin color, texture, turgor normal. No rashes or lesions Neurologic: Grossly normal  EKG EKG Interpretation Date/Time:  Monday June 07 2023 08:43:45 EST Ventricular Rate:  59 PR Interval:  182 QRS Duration:  118 QT Interval:  396 QTC Calculation: 392 R Axis:   28  Text Interpretation: Sinus bradycardia Non-specific intra-ventricular conduction delay When compared with ECG of 11-Dec-2021 08:48, No significant change was found Confirmed by Nanetta Batty [id number]) on 06/07/2023 8:50:40 AM    ASSESSMENT AND PLAN:   PAD (peripheral artery disease) (HCC) History of peripheral arterial disease status post left SFA intervention with stenting 3//2021 with staged right SFA intervention 11/09/19 using shockwave lithotripsy followed by [facility].  He still has mild claudication.  His most recent Dopplers performed 02/09/2023 revealed a right ABI of 1.03 and a left of 0.99.  Did have elevated velocities in his distal left SFA unchanged from his last Doppler study.  Will continue to follow him noninvasively.     Runell Gess MD FACP,FACC,FAHA, [facility] 06/07/2023 8:59 AM

## 2023-06-21 ENCOUNTER — Other Ambulatory Visit: Payer: Self-pay

## 2023-06-21 ENCOUNTER — Emergency Department (HOSPITAL_COMMUNITY)
Admission: EM | Admit: 2023-06-21 | Discharge: 2023-06-21 | Disposition: A | Discharge status: Home / Self Care | Payer: 59 | Attending: Emergency Medicine | Admitting: Emergency Medicine

## 2023-06-21 ENCOUNTER — Encounter (HOSPITAL_COMMUNITY): Payer: Self-pay | Admitting: Emergency Medicine

## 2023-06-21 DIAGNOSIS — M25511 Pain in right shoulder: Secondary | ICD-10-CM | POA: Insufficient documentation

## 2023-06-21 DIAGNOSIS — Z7982 Long term (current) use of aspirin: Secondary | ICD-10-CM | POA: Diagnosis not present

## 2023-06-21 MED ORDER — METHYLPREDNISOLONE 4 MG PO TBPK: ORAL_TABLET | ORAL | 0 refills | Status: DC

## 2023-06-21 MED ORDER — MORPHINE SULFATE 15 MG PO TABS: 7.5000 mg | ORAL_TABLET | ORAL | 0 refills | Status: DC | PRN

## 2023-06-21 MED ORDER — KETOROLAC TROMETHAMINE 15 MG/ML IJ SOLN
15.0000 mg | Freq: Once | INTRAMUSCULAR | Status: AC
  Administered 2023-06-21: 15 mg via INTRAMUSCULAR
  Filled 2023-06-21: qty 1

## 2023-06-21 MED ORDER — DICLOFENAC SODIUM 1 % EX GEL: 4.0000 g | Freq: Four times a day (QID) | CUTANEOUS | 0 refills | Status: AC

## 2023-06-21 NOTE — Discharge Instructions (Addendum)
I have given you a sling for your comfort.  You do need to take your arm out of the sling at least 4 times a day and perform range of motion exercises.  I started you on steroids which hopefully will help.  I have also prescribed a gel you can rub right over where it hurts.  Please call your orthopedic doctor or interventional radiologist to see when they can get you back into the office.  Take the steroids as prescribed.  Use the gel as prescribed. Also take tylenol 1000mg (2 extra strength) four times a day.   Then take the pain medicine if you feel like you need it. Narcotics do not help with the pain, they only make you care about it less.  You can become addicted to this, people may break into your house to steal it.  It will constipate you.  If you drive under the influence of this medicine you can get a DUI.

## 2023-06-21 NOTE — ED Triage Notes (Signed)
Pt reports right shoulder/trapezius pain that is worse with palpation. Pt reports he had the same pain 2 years ago. Denies injury to the area.

## 2023-06-21 NOTE — ED Provider Notes (Signed)
[place] EMERGENCY DEPARTMENT AT [facility] Provider Note   CSN: [id number] Arrival date & time: 06/21/23  7829     History  Chief Complaint  Patient presents with   Shoulder Pain    Jeffrey Winters is a 57 y.o. male.  57 yo M with a chief complaint of right shoulder pain.  He tells me he had this problem in the past that improved with an injection.  He denies recent trauma.  Has had pain for at least a week.  Sharp severe worse with movement of the right arm.  He denies fevers.   Shoulder Pain      Home Medications Prior to Admission medications   Medication Sig Start Date End Date Taking? Authorizing Provider  diclofenac Sodium (VOLTAREN) 1 % GEL Apply 4 g topically 4 (four) times daily. 06/21/23  Yes Melene Plan, DO  methylPREDNISolone (MEDROL DOSEPAK) 4 MG TBPK tablet Day 1: 8mg  before breakfast, 4 mg after lunch, 4 mg after supper, and 8 mg at bedtime Day 2: 4 mg before breakfast, 4 mg after lunch, 4 mg  after supper, and 8 mg  at bedtime Day 3:  4 mg  before breakfast, 4 mg  after lunch, 4 mg after supper, and 4 mg  at bedtime Day 4: 4 mg  before breakfast, 4 mg  after lunch, and 4 mg at bedtime Day 5: 4 mg  before breakfast and 4 mg at bedtime Day 6: 4 mg  before breakfast 06/21/23  Yes Melene Plan, DO  morphine (MSIR) 15 MG tablet Take 0.5 tablets (7.5 mg total) by mouth every 4 (four) hours as needed. 06/21/23  Yes Melene Plan, DO  albuterol (VENTOLIN HFA) 108 (90 Base) MCG/ACT inhaler INHALE 1-2 PUFFS INTO THE LUNGS EVERY 6 (SIX) HOURS AS NEEDED FOR WHEEZING OR SHORTNESS OF BREATH. 12/26/21 12/26/22  Ivonne Andrew, NP  ammonium lactate (AMLACTIN) 12 % lotion Apply 1 Application topically as needed for dry skin. 08/13/22   Ivonne Andrew, NP  ANORO ELLIPTA 62.5-25 MCG/ACT AEPB INHALE 1 PUFF INTO THE LUNGS DAILY AT 6 (SIX) AM. 04/14/23   Ivonne Andrew, NP  aspirin EC 81 MG tablet Take 1 tablet orally daily 03/13/21     atorvastatin (LIPITOR) 80 MG tablet TAKE 1  TABLET BY MOUTH EVERY DAY 04/14/23   Azalee Course, PA  buPROPion (WELLBUTRIN XL) 150 MG 24 hr tablet TAKE 1 TABLET BY MOUTH EVERYDAY AT BEDTIME 03/08/23   Ivonne Andrew, NP  clopidogrel (PLAVIX) 75 MG tablet TAKE 1 TABLET BY MOUTH EVERY DAY 04/14/23   Azalee Course, PA  escitalopram (LEXAPRO) 10 MG tablet TAKE 1 TABLET BY MOUTH EVERY DAY 03/01/23   Ivonne Andrew, NP  famotidine (PEPCID) 20 MG tablet Take 1 tablet by mouth 2 times daily 03/13/21     fluconazole (DIFLUCAN) 150 MG tablet Take 1 tablet (150 mg total) by mouth once a week. 09/02/22   Vivi Barrack, DPM  isosorbide mononitrate (IMDUR) 60 MG 24 hr tablet TAKE 1.5 TABLETS (90 MG TOTAL) BY MOUTH DAILY. 02/26/23   Azalee Course, PA  metoprolol succinate (TOPROL-XL) 25 MG 24 hr tablet Take 1 tablet (25 mg total) by mouth daily. 07/20/22   Tonny Bollman, MD  nitroGLYCERIN (NITROSTAT) 0.4 MG SL tablet Take 1 tablet sublingually every 5 minutes as needed for chest pain 05/20/22   Tereso Newcomer T, PA-C      Allergies    Patient has no known allergies.  Review of Systems   Review of Systems  Physical Exam Updated Vital Signs BP (!) 152/71 (BP Location: Left Arm)   Pulse 76   Temp 98 F (36.7 C) (Oral)   Resp 15   SpO2 100%  Physical Exam Vitals and nursing note reviewed.  Constitutional:      Appearance: He is well-developed.  HENT:     Head: Normocephalic and atraumatic.  Eyes:     Pupils: Pupils are equal, round, and reactive to light.  Neck:     Vascular: No JVD.  Cardiovascular:     Rate and Rhythm: Normal rate and regular rhythm.     Heart sounds: No murmur heard.    No friction rub. No gallop.  Pulmonary:     Effort: No respiratory distress.     Breath sounds: No wheezing.  Abdominal:     General: There is no distension.     Tenderness: There is no abdominal tenderness. There is no guarding or rebound.  Musculoskeletal:        General: Normal range of motion.     Cervical back: Normal range of motion and neck supple.      Comments: The patient has diffuse pain around the right shoulder.  Most of the pain is at the right trapezius.  I am able to range the shoulder without significant discomfort.  Pulse motor and sensation intact distally.  Skin:    Coloration: Skin is not pale.     Findings: No rash.  Neurological:     Mental Status: He is alert and oriented to person, place, and time.  Psychiatric:        Behavior: Behavior normal.     ED Results / Procedures / Treatments   Labs (all labs ordered are listed, but only abnormal results are displayed) Labs Reviewed - No data to display  EKG None  Radiology No results found.  Procedures Procedures    Medications Ordered in ED Medications  ketorolac (TORADOL) 15 MG/ML injection 15 mg (has no administration in time range)    ED Course/ Medical Decision Making/ A&P                                 Medical Decision Making Risk Prescription drug management.   57 yo M with a chief complaint of right shoulder pain.  Going on for about a week.  Atraumatic.  Patient tells me it is very similar to when he required an injection in the past.  My record review the patient had been diagnosed with cervical radiculopathy.  Had an epidural injection that was reportedly successful.  As he feels this pain is the same I think that is the most likely diagnosis on exam I would suspect more likely the patient has trapezius muscle spasm.  He is on dual antiplatelet therapy and so I will start him on a steroid Dosepak.  Diclofenac gel.  Encouraged him to follow-up with his orthopedist as well as with his interventional radiologist.  10:07 AM:  I have discussed the diagnosis/risks/treatment options with the patient.  Evaluation and diagnostic testing in the emergency department does not suggest an emergent condition requiring admission or immediate intervention beyond what has been performed at this time.  They will follow up with PCP, ortho IR. We also discussed  returning to the ED immediately if new or worsening sx occur. We discussed the sx which are most concerning (e.g., sudden worsening  pain, fever, inability to tolerate by mouth) that necessitate immediate return. Medications administered to the patient during their visit and any new prescriptions provided to the patient are listed below.  Medications given during this visit Medications  ketorolac (TORADOL) 15 MG/ML injection 15 mg (has no administration in time range)     The patient appears reasonably screen and/or stabilized for discharge and I doubt any other medical condition or other [facility] requiring further screening, evaluation, or treatment in the ED at this time prior to discharge.          Final Clinical Impression(s) / ED Diagnoses Final diagnoses:  Acute pain of right shoulder    Rx / DC Orders ED Discharge Orders          Ordered    methylPREDNISolone (MEDROL DOSEPAK) 4 MG TBPK tablet        06/21/23 0958    diclofenac Sodium (VOLTAREN) 1 % GEL  4 times daily        06/21/23 0958    morphine (MSIR) 15 MG tablet  Every 4 hours PRN        06/21/23 0958              Melene Plan, DO 06/21/23 1007

## 2023-06-22 ENCOUNTER — Telehealth (HOSPITAL_COMMUNITY): Payer: Self-pay | Admitting: Emergency Medicine

## 2023-06-22 MED ORDER — MORPHINE SULFATE 15 MG PO TABS: 7.5000 mg | ORAL_TABLET | ORAL | 0 refills | Status: AC | PRN

## 2023-06-22 NOTE — Telephone Encounter (Signed)
Script sent to wrong pharmacy, will resend.

## 2023-06-24 NOTE — Progress Notes (Addendum)
Cardiology Office Note    Patient Name: Jeffrey Winters Date of Encounter: 06/24/2023  Primary Care Provider:  Ivonne Andrew, NP Primary Cardiologist:  Tonny Bollman, MD Primary Electrophysiologist: None   Past Medical History    Past Medical History:  Diagnosis Date   COPD (chronic obstructive pulmonary disease) (HCC)    Coronary artery disease 2017   MI s/p PCI to OM2 // s/p MI 07/2019 >> DES to RCA; residual RPL2 80 // Myoview 08/2019: EF 61, small area of mod ischemia inf base; Low Risk // Cath 5/23: RCA and OM1 stents ok, mod dz LAD neg RFR, RPLA 80 - med Rx unless refractory angina   Hyperlipidemia LDL goal <70 07/12/2019   Hyperlipidemia   Hypertension    MI (myocardial infarction) (HCC)    PAD (peripheral artery disease) (HCC)    LE Art [place] 08/2019: R SFA 50-74; L SFA 50-74 // s/p R SFA PTA in 4/21; s/p L SFA PTA in 3/21 (Dr. Allyson Sabal)   Tobacco use     History of Present Illness  Jeffrey Winters is a 57 y.o. male with PMH of CAD s/p NSTEMI 2017 with DES to OM1 and subsequent NSTEMI in 2017 and 2020 with DES to RCA and OM1 with residual RPL2 at 80%.,  PAD s/p left SFA and right SFA with PTA, HTN, HLD, tobacco use, COPD who presents today for 104-month follow-up.   Mr. Jeffrey Winters was last seen by me on 10/09/2022 for 78-month follow-up.  During visit patient was not experiencing any further chest pain with increased Imdur.  His blood pressure was noted to be well-controlled.  He was unfortunately still smoking and reported increased claudication and swelling.  He was seen by Azalee Course, PA on 02/24/2023 and reported pain primarily in the right knee is worse with standing or trying to climb stairs.  He had undergone ABIs on 02/19/2023 that were normal and reassuring.  He was recommended to follow-up with Ortho care for possible arthritic knee pain.  He was seen by Dr. Allyson Sabal on 06/07/2023 and continued to endorse mild claudication with plan to continue to follow noninvasively at this point.  Mr.  Jeffrey Winters presents today for 65-month follow-up.  He has been doing well from a cardiac perspective with no recurrent chest pain.  He is currently dealing with severe shoulder pain and was recently seen in the ED.  The pain is severe enough to cause discomfort and disrupt daily activities. The patient describes the pain as being in the muscle and causing numbness in two fingers, suggesting possible nerve involvement. The patient has previously received a steroid injection in the shoulder joint for similar symptoms. The patient also reports occasional wheezing and exhaustion, which may be related to their cardiac history or COPD. The patient is currently on amlodipine for blood pressure control and continues to smoke. The patient maintains a daily walking program as advised for their claudication.  His blood pressure today was initially elevated at 140/58 and was 138/62 on recheck.  This may be related to pain due to patient's recent shoulder injury.  He is scheduled to follow-up with orthopedic doctor next week for further evaluation.  Review of Systems  Please see the history of present illness.    All other systems reviewed and are otherwise negative except as noted above.  Physical Exam    Wt Readings from Last 3 Encounters:  06/07/23 239 lb (108.4 kg)  02/24/23 231 lb 3.2 oz (104.9 kg)  10/09/22 237 lb (  107.5 kg)   WU:JWJXB were no vitals filed for this visit.,There is no height or weight on file to calculate BMI. GEN: Well nourished, well developed in no acute distress Neck: No JVD; No carotid bruits Pulmonary: Clear to auscultation without rales, wheezing or rhonchi  Cardiovascular: Normal rate. Regular rhythm. Normal S1. Normal S2.   Murmurs: There is no murmur.  ABDOMEN: Soft, non-tender, non-distended EXTREMITIES:  No edema; No deformity   EKG/LABS/ Recent Cardiac Studies   ECG personally reviewed by me today -none completed today  Risk Assessment/Calculations:          Lab  Results  Component Value Date   WBC 4.0 08/13/2022   HGB 14.3 08/13/2022   HCT 44.1 08/13/2022   MCV 87 08/13/2022   PLT 159 08/13/2022   Lab Results  Component Value Date   CREATININE 0.91 08/13/2022   BUN 13 08/13/2022   NA 141 08/13/2022   K 4.7 08/13/2022   CL 103 08/13/2022   CO2 25 08/13/2022   Lab Results  Component Value Date   CHOL 122 03/12/2022   HDL 45 03/12/2022   LDLCALC 50 03/12/2022   TRIG 162 (H) 03/12/2022   CHOLHDL 2.7 03/12/2022    Lab Results  Component Value Date   HGBA1C 6.1 (A) 12/26/2021   HGBA1C 6.1 12/26/2021   HGBA1C 6.1 12/26/2021   HGBA1C 6.1 12/26/2021   Assessment & Plan    1.  Coronary artery disease: -s/p NSTEMI in 2017 treated with DES to OM1, repeat NSTEMI 05/2016, STEMI 2020 with DES to RCA repeat LHC performed 12/2021 -Today patient reports no chest pain or angina since previous follow-up. -Continue current GDMT with atorvastatin 80 mg, Toprol 25 mg, ASA 81 mg, Plavix 75 mg daily, Imdur 90 mg daily   2.  Essential hypertension: -Patient's blood pressure today was elevated initially at 140/58 and was 138/62 on recheck. -Continue current antihypertensive regimen with Toprol XL 25 mg daily   3.  Hyperlipidemia: -Patient's last LDL was 58 at goal of less than 70 -Patient was advised to decrease his consumption of red meats and saturated fats. -Continue current GDMT with Lipitor 80 mg daily   4.  Peripheral artery disease: -Patient recently underwent repeat ABIs that were stable and was seen by Dr. Allyson Sabal recommended noninvasive therapy at this time. -Recommend that patient start a walking program to help with his claudication.   5.  Tobacco abuse: -Patient is currently smoking but states that he has reduced his amount daily. -Smoking cessation has been advised.  6.  Preoperative clearance: -The patient affirms he has been doing well without any new cardiac symptoms. They are able to achieve 4 METS without cardiac limitations.  Therefore, based on ACC/AHA guidelines, the patient would be at acceptable risk for the planned procedure without further cardiovascular testing. The patient was advised that if he develops new symptoms prior to surgery to contact our office to arrange for a follow-up visit, and he verbalized understanding.   Disposition: Follow-up with Tonny Bollman, MD or APP in 6 months    Signed, Napoleon Form, Leodis Rains, NP 06/24/2023, 6:36 PM [facility]

## 2023-06-25 ENCOUNTER — Ambulatory Visit: Payer: 59 | Attending: Nurse Practitioner | Admitting: Nurse Practitioner

## 2023-06-25 ENCOUNTER — Encounter: Payer: Self-pay | Admitting: Nurse Practitioner

## 2023-06-25 VITALS — BP 138/62 | HR 97 | Ht 67.0 in | Wt 239.0 lb

## 2023-06-25 DIAGNOSIS — I10 Essential (primary) hypertension: Secondary | ICD-10-CM | POA: Diagnosis not present

## 2023-06-25 DIAGNOSIS — E785 Hyperlipidemia, unspecified: Secondary | ICD-10-CM | POA: Diagnosis not present

## 2023-06-25 DIAGNOSIS — I25119 Atherosclerotic heart disease of native coronary artery with unspecified angina pectoris: Secondary | ICD-10-CM

## 2023-06-25 DIAGNOSIS — I739 Peripheral vascular disease, unspecified: Secondary | ICD-10-CM

## 2023-06-25 DIAGNOSIS — Z720 Tobacco use: Secondary | ICD-10-CM | POA: Diagnosis not present

## 2023-06-25 NOTE — Patient Instructions (Signed)
Medication Instructions:  Your physician recommends that you continue on your current medications as directed. Please refer to the Current Medication list given to you today. *If you need a refill on your cardiac medications before your next appointment, please call your pharmacy*   Lab Work: None ordered   Testing/Procedures: None ordered   Follow-Up: At [facility], you and your health needs are our priority.  As part of our continuing mission to provide you with exceptional heart care, we have created designated Provider Care Teams.  These Care Teams include your primary Cardiologist (physician) and Advanced Practice Providers (APPs -  Physician Assistants and Nurse Practitioners) who all work together to provide you with the care you need, when you need it.  We recommend signing up for the patient portal called "MyChart".  Sign up information is provided on this After Visit Summary.  MyChart is used to connect with patients for Virtual Visits (Telemedicine).  Patients are able to view lab/test results, encounter notes, upcoming appointments, etc.  Non-urgent messages can be sent to your provider as well.   To learn more about what you can do with MyChart, go to [email or web address].    Your next appointment:   6 month(s)  Provider:   Tonny Bollman, MD     Other Instructions

## 2023-06-28 ENCOUNTER — Encounter (HOSPITAL_COMMUNITY): Payer: 59

## 2023-06-29 ENCOUNTER — Other Ambulatory Visit (INDEPENDENT_AMBULATORY_CARE_PROVIDER_SITE_OTHER): Payer: 59

## 2023-06-29 ENCOUNTER — Ambulatory Visit: Payer: 59 | Admitting: Physician Assistant

## 2023-06-29 ENCOUNTER — Encounter: Payer: Self-pay | Admitting: Physician Assistant

## 2023-06-29 DIAGNOSIS — G8929 Other chronic pain: Secondary | ICD-10-CM | POA: Diagnosis not present

## 2023-06-29 DIAGNOSIS — M25511 Pain in right shoulder: Secondary | ICD-10-CM | POA: Diagnosis not present

## 2023-06-29 MED ORDER — LIDOCAINE HCL 1 % IJ SOLN
3.0000 mL | INTRAMUSCULAR | Status: AC | PRN
  Administered 2023-06-29: 3 mL

## 2023-06-29 MED ORDER — METHYLPREDNISOLONE ACETATE 40 MG/ML IJ SUSP
40.0000 mg | INTRAMUSCULAR | Status: AC | PRN
  Administered 2023-06-29: 40 mg via INTRA_ARTICULAR

## 2023-06-29 NOTE — Progress Notes (Signed)
Office Visit Note   Patient: Jeffrey Winters           Date of Birth: [birth year]           MRN: [id number] Visit Date: 06/29/2023              Requested by: Jeffrey Andrew, NP [phone] [address]. [address] [place],  [place] [address] PCP: Jeffrey Andrew, NP  Chief Complaint  Patient presents with  . Right Shoulder - Pain      HPI: [name] is a pleasant 57 year old gentleman who presents today with right shoulder pain.  He denies any injuries.  He says his had something similar a couple years ago he did have a steroid injection at that time and it helped.  He is having difficulty sleeping he has gotten some pain medicine from primary care physician  Assessment & Plan: Visit Diagnoses: Impingement syndrome right shoulder  Plan: We talked about the natural history of this he does have some neck issues as well however most of his symptoms are focused on his shoulder and rotator cuff tendinitis today.  Will go forward with a subacromial injection.  Told him to give this about a week if he still has significant neck issues could consider referral for another injection to [facility] imaging  Follow-Up Instructions: As needed  Ortho Exam  Patient is alert, oriented, no adenopathy, well-dressed, normal affect, normal respiratory effort. Right shoulder is neurovascularly intact he does have pain with full extension and is slow to internally rotate behind his back he is got good grip strength good biceps triceps strength.  He has a positive empty can test and a mildly positive speeds test.  No pain or loss of motion with external rotation strength is good his resisted abduction  Imaging: No results found. No images are attached to the encounter.  Labs: Lab Results  Component Value Date   HGBA1C 6.1 (A) 12/26/2021   HGBA1C 6.1 12/26/2021   HGBA1C 6.1 12/26/2021   HGBA1C 6.1 12/26/2021     Lab Results  Component Value Date   ALBUMIN 3.7 (L) 08/13/2022   ALBUMIN 3.9 03/12/2022    ALBUMIN 4.1 09/10/2021    Lab Results  Component Value Date   MG 1.9 08/15/2019   No results found for: "VD25OH"  No results found for: "PREALBUMIN"    Latest Ref Rng & Units 08/13/2022    9:23 AM 05/06/2022   11:22 AM 03/05/2022   11:04 AM  CBC EXTENDED  WBC 3.4 - 10.8 x10E3/uL 4.0  4.4  5.3   RBC 4.14 - 5.80 x10E6/uL 5.07  5.23  5.32   Hemoglobin 13.0 - 17.7 g/dL 08.6  57.8  46.9   HCT 37.5 - 51.0 % 44.1  45.2  44.8   Platelets 150 - 450 x10E3/uL 159  159  156   NEUT# 1,500 - 7,800 cells/uL  1,470  2,306   Lymph# 850 - 3,900 cells/uL  2,323  2,226      There is no height or weight on file to calculate BMI.  Orders:  No orders of the defined types were placed in this encounter.  No orders of the defined types were placed in this encounter.    Procedures: Large Joint Inj: R subacromial bursa on 06/29/2023 9:28 AM Indications: diagnostic evaluation and pain Details: 25 G 1.5 in needle, posterior approach  Arthrogram: No  Medications: 3 mL lidocaine 1 %; 40 mg methylPREDNISolone acetate 40 MG/ML Outcome: tolerated well, no immediate  complications Procedure, treatment alternatives, risks and benefits explained, specific risks discussed. Consent was given by the patient.    Clinical Data: No additional findings.  ROS:  All other systems negative, except as noted in the HPI. Review of Systems  Objective: Vital Signs: There were no vitals taken for this visit.  Specialty Comments:  No specialty comments available.  PMFS History: Patient Active Problem List   Diagnosis Date Noted  . Dry skin 08/13/2022  . Dental abscess 03/11/2022  . Radiculopathy, cervical region 01/13/2022  . Cervical disc disorder with radiculopathy, unspecified cervical region 01/13/2022  . Chronic right shoulder pain 01/08/2022  . Right arm pain 12/31/2021  . Health care maintenance 12/26/2021  . CAD (coronary artery disease) 07/08/2021  . Anxiety 03/13/2021  . CHF (congestive heart  failure) (HCC) 03/13/2021  . Depression 03/13/2021  . GERD (gastroesophageal reflux disease) 03/13/2021  . Obesity 03/13/2021  . Tobacco dependence 03/13/2021  . Facial cellulitis 11/25/2020  . Essential hypertension 11/25/2020  . COPD (chronic obstructive pulmonary disease) (HCC) 11/25/2020  . Tobacco abuse 11/20/2020  . Influenza vaccine refused 05/29/2020  . PAD (peripheral artery disease) (HCC)   . Hyperlipidemia LDL goal <70 07/12/2019  . Old MI (myocardial infarction) 07/10/2019  . Obstructive sleep apnea hypopnea, moderate 05/15/2016  . Atherosclerotic heart disease 05/14/2016  . Alcohol abuse 02/20/2016  . Bradycardia 02/20/2016   Past Medical History:  Diagnosis Date  . COPD (chronic obstructive pulmonary disease) (HCC)   . Coronary artery disease 2017   MI s/p PCI to OM2 // s/p MI 07/2019 >> DES to RCA; residual RPL2 80 // Myoview 08/2019: EF 61, small area of mod ischemia inf base; Low Risk // Cath 5/23: RCA and OM1 stents ok, mod dz LAD neg RFR, RPLA 80 - med Rx unless refractory angina  . Hyperlipidemia LDL goal <70 07/12/2019   Hyperlipidemia  . Hypertension   . MI (myocardial infarction) (HCC)   . PAD (peripheral artery disease) (HCC)    LE Art [place] 08/2019: R SFA 50-74; L SFA 50-74 // s/p R SFA PTA in 4/21; s/p L SFA PTA in 3/21 (Dr. Allyson Sabal)  . Tobacco use     Family History  Problem Relation Age of Onset  . Heart disease Mother   . Heart disease Father     Past Surgical History:  Procedure Laterality Date  . ABDOMINAL AORTOGRAM W/LOWER EXTREMITY Bilateral 10/05/2019   Procedure: ABDOMINAL AORTOGRAM W/LOWER EXTREMITY;  Surgeon: Runell Gess, MD;  Location: [facility] INVASIVE CV LAB;  Service: Cardiovascular;  Laterality: Bilateral;  . ABDOMINAL AORTOGRAM W/LOWER EXTREMITY N/A 11/09/2019   Procedure: ABDOMINAL AORTOGRAM W/LOWER EXTREMITY;  Surgeon: Runell Gess, MD;  Location: [facility] INVASIVE CV LAB;  Service: Cardiovascular;  Laterality: N/A;  . CORONARY PRESSURE/FFR  STUDY N/A 12/10/2021   Procedure: INTRAVASCULAR PRESSURE WIRE/FFR STUDY;  Surgeon: Tonny Bollman, MD;  Location: [facility] INVASIVE CV LAB;  Service: Cardiovascular;  Laterality: N/A;  . CORONARY/GRAFT ACUTE MI REVASCULARIZATION N/A 07/10/2019   Procedure: CORONARY/GRAFT ACUTE MI REVASCULARIZATION;  Surgeon: Yvonne Kendall, MD;  Location: [facility] INVASIVE CV LAB;  Service: Cardiovascular;  Laterality: N/A;  . LEFT HEART CATH AND CORONARY ANGIOGRAPHY N/A 07/10/2019   Procedure: LEFT HEART CATH AND CORONARY ANGIOGRAPHY;  Surgeon: Yvonne Kendall, MD;  Location: [facility] INVASIVE CV LAB;  Service: Cardiovascular;  Laterality: N/A;  . LEFT HEART CATH AND CORONARY ANGIOGRAPHY N/A 12/10/2021   Procedure: LEFT HEART CATH AND CORONARY ANGIOGRAPHY;  Surgeon: Tonny Bollman, MD;  Location[facility] INVASIVE CV LAB;  Service: Cardiovascular;  Laterality: N/A;  . PERCUTANEOUS CORONARY STENT INTERVENTION (PCI-S)  2017   DES to OM2 at [facility]  . PERIPHERAL INTRAVASCULAR LITHOTRIPSY  11/09/2019   Procedure: INTRAVASCULAR LITHOTRIPSY;  Surgeon: Runell Gess, MD;  Location: [facility] INVASIVE CV LAB;  Service: Cardiovascular;;  . PERIPHERAL VASCULAR ATHERECTOMY Left 10/05/2019   Procedure: PERIPHERAL VASCULAR ATHERECTOMY;  Surgeon: Runell Gess, MD;  Location: [facility] INVASIVE CV LAB;  Service: Cardiovascular;  Laterality: Left;  SFA  . PERIPHERAL VASCULAR BALLOON ANGIOPLASTY  11/09/2019   Procedure: PERIPHERAL VASCULAR BALLOON ANGIOPLASTY;  Surgeon: Runell Gess, MD;  Location: [facility] INVASIVE CV LAB;  Service: Cardiovascular;;  . PERIPHERAL VASCULAR INTERVENTION Left 10/05/2019   Procedure: PERIPHERAL VASCULAR INTERVENTION;  Surgeon: Runell Gess, MD;  Location: [facility] INVASIVE CV LAB;  Service: Cardiovascular;  Laterality: Left;  SFA   Social History   Occupational History  . Not on file  Tobacco Use  . Smoking status: Every Day    Current packs/day: 1.00    Average packs/day: 1 pack/day for 7.0 years (7.0 ttl pk-yrs)    Types:  Cigarettes  . Smokeless tobacco: Never  . Tobacco comments:    smokes 1/2 pack per day 06/19/2020  Vaping Use  . Vaping status: Never Used  Substance and Sexual Activity  . Alcohol use: Not Currently  . Drug use: No  . Sexual activity: Not Currently

## 2023-07-02 ENCOUNTER — Other Ambulatory Visit: Payer: Self-pay | Admitting: Physician Assistant

## 2023-07-09 ENCOUNTER — Ambulatory Visit: Payer: 59 | Admitting: Physician Assistant

## 2023-07-13 ENCOUNTER — Ambulatory Visit (INDEPENDENT_AMBULATORY_CARE_PROVIDER_SITE_OTHER): Payer: 59 | Admitting: Physician Assistant

## 2023-07-13 ENCOUNTER — Encounter: Payer: Self-pay | Admitting: Physician Assistant

## 2023-07-13 ENCOUNTER — Other Ambulatory Visit (INDEPENDENT_AMBULATORY_CARE_PROVIDER_SITE_OTHER): Payer: 59

## 2023-07-13 DIAGNOSIS — M542 Cervicalgia: Secondary | ICD-10-CM | POA: Diagnosis not present

## 2023-07-13 MED ORDER — DICLOFENAC SODIUM 75 MG PO TBEC: 75.0000 mg | DELAYED_RELEASE_TABLET | Freq: Two times a day (BID) | ORAL | 0 refills | Status: AC | PRN

## 2023-07-13 NOTE — Progress Notes (Signed)
Office Visit Note   Patient: Jeffrey Winters           Date of Birth: 1966-03-30           MRN: [id number] Visit Date: 07/13/2023              Requested by: Ivonne Andrew, NP [phone] [address] [place],  [place] [address] PCP: Ivonne Andrew, NP   Assessment & Plan: Visit Diagnoses: Cervical radiculopathy  Plan: Patient is a pleasant 57 year old gentleman who has been following for right shoulder and neck pain.  I did give him a subacromial injection into his shoulder but it did not help the pain he is describing to me.  He is having pain in his neck and stiffness with radiation down his arm affecting the ring finger and short finger.  He has had this in the past and was successful getting a epidural steroid injection at [facility] imaging.  He has not had any problems since.  He has had no new injuries.  Will refer him for a new injection.  He is also wondering about medication.  He said he did take a Voltaren tablet from his sister and that seemed to help even more than any morphine.  He denies any problems with ibuprofen or other similar drugs in the past so I have given him a prescription for this he knows to take it with food  Follow-Up Instructions: As needed   Orders:  No orders of the defined types were placed in this encounter.  No orders of the defined types were placed in this encounter.     Procedures: No procedures performed   Clinical Data: No additional findings.   Subjective: Neck pain with right radicular symptoms  HPI Jeffrey Winters is a pleasant 57 year old gentleman who I have seen for shoulder issues.  He comes in today with complaints of neck pain and stiffness radiating down his arm he is has some paresthesias in the ring finger and short finger which have been present for a while.  He has tried steroid Dosepaks.  He has had an injection at [facility] imaging at C[facility]7 on the right which seemed to help him quite a bit  Review of Systems  All other systems  reviewed and are negative.   Objective: Vital Signs: There were no vitals taken for this visit.  Physical Exam Constitutional:      Appearance: Normal appearance.  Pulmonary:     Effort: Pulmonary effort is normal.  Skin:    General: Skin is warm and dry.  Neurological:     General: No focal deficit present.     Mental Status: He is alert and oriented to person, place, and time.   Ortho Exam Examination he has reproduction of her radicular symptoms with range of motion of his neck flexion and extension.  He has some altered sensation in the ring finger and short finger of the right hand however has brisk capillary refill no swelling no redness no erythema no step-offs in the cervical spine.  He has good triceps and biceps function. Specialty Comments:  No specialty comments available.  Imaging: No results found.   PMFS History: Patient Active Problem List   Diagnosis Date Noted   Dry skin 08/13/2022   Dental abscess 03/11/2022   Radiculopathy, cervical region 01/13/2022   Cervical disc disorder with radiculopathy, unspecified cervical region 01/13/2022   Chronic right shoulder pain 01/08/2022   Right arm pain 12/31/2021   Health care  maintenance 12/26/2021   CAD (coronary artery disease) 07/08/2021   Anxiety 03/13/2021   CHF (congestive heart failure) (HCC) 03/13/2021   Depression 03/13/2021   GERD (gastroesophageal reflux disease) 03/13/2021   Obesity 03/13/2021   Tobacco dependence 03/13/2021   Facial cellulitis 11/25/2020   Essential hypertension 11/25/2020   COPD (chronic obstructive pulmonary disease) (HCC) 11/25/2020   Tobacco abuse 11/20/2020   Influenza vaccine refused 05/29/2020   PAD (peripheral artery disease) (HCC)    Hyperlipidemia LDL goal <70 07/12/2019   Old MI (myocardial infarction) 07/10/2019   Obstructive sleep apnea hypopnea, moderate 05/15/2016   Atherosclerotic heart disease 05/14/2016   Alcohol abuse 02/20/2016   Bradycardia 02/20/2016    Past Medical History:  Diagnosis Date   COPD (chronic obstructive pulmonary disease) (HCC)    Coronary artery disease 2017   MI s/p PCI to OM2 // s/p MI 07/2019 >> DES to RCA; residual RPL2 80 // Myoview 08/2019: EF 61, small area of mod ischemia inf base; Low Risk // Cath 5/23: RCA and OM1 stents ok, mod dz LAD neg RFR, RPLA 80 - med Rx unless refractory angina   Hyperlipidemia LDL goal <70 07/12/2019   Hyperlipidemia   Hypertension    MI (myocardial infarction) (HCC)    PAD (peripheral artery disease) (HCC)    LE Art [place] 08/2019: R SFA 50-74; L SFA 50-74 // s/p R SFA PTA in 4/21; s/p L SFA PTA in 3/21 (Dr. Allyson Sabal)   Tobacco use     Family History  Problem Relation Age of Onset   Heart disease Mother    Heart disease Father     Past Surgical History:  Procedure Laterality Date   ABDOMINAL AORTOGRAM W/LOWER EXTREMITY Bilateral 10/05/2019   Procedure: ABDOMINAL AORTOGRAM W/LOWER EXTREMITY;  Surgeon: Runell Gess, MD;  Location: [facility] INVASIVE CV LAB;  Service: Cardiovascular;  Laterality: Bilateral;   ABDOMINAL AORTOGRAM W/LOWER EXTREMITY N/A 11/09/2019   Procedure: ABDOMINAL AORTOGRAM W/LOWER EXTREMITY;  Surgeon: Runell Gess, MD;  Location: [facility] INVASIVE CV LAB;  Service: Cardiovascular;  Laterality: N/A;   CORONARY PRESSURE/FFR STUDY N/A 12/10/2021   Procedure: INTRAVASCULAR PRESSURE WIRE/FFR STUDY;  Surgeon: Tonny Bollman, MD;  Location: [facility] INVASIVE CV LAB;  Service: Cardiovascular;  Laterality: N/A;   CORONARY/GRAFT ACUTE MI REVASCULARIZATION N/A 07/10/2019   Procedure: CORONARY/GRAFT ACUTE MI REVASCULARIZATION;  Surgeon: Yvonne Kendall, MD;  Location: [facility] INVASIVE CV LAB;  Service: Cardiovascular;  Laterality: N/A;   LEFT HEART CATH AND CORONARY ANGIOGRAPHY N/A 07/10/2019   Procedure: LEFT HEART CATH AND CORONARY ANGIOGRAPHY;  Surgeon: Yvonne Kendall, MD;  Location: [facility] INVASIVE CV LAB;  Service: Cardiovascular;  Laterality: N/A;   LEFT HEART CATH AND CORONARY ANGIOGRAPHY N/A  12/10/2021   Procedure: LEFT HEART CATH AND CORONARY ANGIOGRAPHY;  Surgeon: Tonny Bollman, MD;  Location: [facility] INVASIVE CV LAB;  Service: Cardiovascular;  Laterality: N/A;   PERCUTANEOUS CORONARY STENT INTERVENTION (PCI-S)  2017   DES to OM2 at [facility]   PERIPHERAL INTRAVASCULAR LITHOTRIPSY  11/09/2019   Procedure: INTRAVASCULAR LITHOTRIPSY;  Surgeon: Runell Gess, MD;  Location: [facility] INVASIVE CV LAB;  Service: Cardiovascular;;   PERIPHERAL VASCULAR ATHERECTOMY Left 10/05/2019   Procedure: PERIPHERAL VASCULAR ATHERECTOMY;  Surgeon: Runell Gess, MD;  Location: [facility] INVASIVE CV LAB;  Service: Cardiovascular;  Laterality: Left;  SFA   PERIPHERAL VASCULAR BALLOON ANGIOPLASTY  11/09/2019   Procedure: PERIPHERAL VASCULAR BALLOON ANGIOPLASTY;  Surgeon: Runell Gess, MD;  Location: [facility] INVASIVE CV LAB;  Service: Cardiovascular;;   PERIPHERAL VASCULAR INTERVENTION  Left 10/05/2019   Procedure: PERIPHERAL VASCULAR INTERVENTION;  Surgeon: Runell Gess, MD;  Location: [facility] INVASIVE CV LAB;  Service: Cardiovascular;  Laterality: Left;  SFA   Social History   Occupational History   Not on file  Tobacco Use   Smoking status: Every Day    Current packs/day: 1.00    Average packs/day: 1 pack/day for 7.0 years (7.0 ttl pk-yrs)    Types: Cigarettes   Smokeless tobacco: Never   Tobacco comments:    smokes 1/2 pack per day 06/19/2020  Vaping Use   Vaping status: Never Used  Substance and Sexual Activity   Alcohol use: Not Currently   Drug use: No   Sexual activity: Not Currently

## 2023-07-14 ENCOUNTER — Telehealth: Payer: Self-pay | Admitting: *Deleted

## 2023-07-14 NOTE — Telephone Encounter (Signed)
   Pre-operative Risk Assessment    Patient Name: Jeffrey Winters  DOB: 1965/08/14 MRN: [id number]      Request for Surgical Clearance    Procedure:   EPI / CERVICAL  Date of Surgery:  Clearance TBD                                 Surgeon:   Surgeon's Group or Practice Name:  [facility] Phone number:  [phone] Fax number:  [phone]   Type of Clearance Requested:   - Medical  - Pharmacy:  Hold Clopidogrel (Plavix) X'S 5 DAYS   Type of Anesthesia:  Not Indicated   Additional requests/questions:    [name]   07/14/2023, 1:39 PM

## 2023-07-14 NOTE — Telephone Encounter (Signed)
   Patient Name: Jeffrey Winters  DOB: January 02, 1966 MRN: [id number]  Primary Cardiologist: Tonny Bollman, MD  Chart reviewed as part of pre-operative protocol coverage. Given past medical history and time since last visit, based on ACC/AHA guidelines, Jeffrey Winters is at acceptable risk for the planned procedure without further cardiovascular testing.   Per Dr. Excell Seltzer and previous request patient can hold Plavix 5 days prior to procedure and should restart postprocedure when surgically safe and hemostasis is achieved.  The patient was advised that if he develops new symptoms prior to surgery to contact our office to arrange for a follow-up visit, and he verbalized understanding.  I will route this recommendation to the requesting party via Epic fax function and remove from pre-op pool.  Please call with questions.  Napoleon Form, Leodis Rains, NP 07/14/2023, 1:54 PM

## 2023-08-03 ENCOUNTER — Encounter: Payer: Self-pay | Admitting: Physician Assistant

## 2023-08-11 NOTE — Discharge Instructions (Signed)
 Post Procedure Spinal Discharge Instruction Sheet  You may resume a regular diet and any medications that you routinely take (including pain medications) unless otherwise noted by MD.  No driving day of procedure.  Light activity throughout the rest of the day.  Do not do any strenuous work, exercise, bending or lifting.  The day following the procedure, you can resume normal physical activity but you should refrain from exercising or physical therapy for at least three days thereafter.  You may apply ice to the injection site, 20 minutes on, 20 minutes off, as needed. Do not apply ice directly to skin.    Common Side Effects:  Headaches- take your usual medications as directed by your physician.  Increase your fluid intake.  Caffeinated beverages may be helpful.  Lie flat in bed until your headache resolves.  Restlessness or inability to sleep- you may have trouble sleeping for the next few days.  Ask your referring physician if you need any medication for sleep.  Facial flushing or redness- should subside within a few days.  Increased pain- a temporary increase in pain a day or two following your procedure is not unusual.  Take your pain medication as prescribed by your referring physician.  Leg cramps  Please contact our office at [phone] for the following symptoms: Fever greater than 100 degrees. Headaches unresolved with medication after 2-3 days. Increased swelling, pain, or redness at injection site.   Thank you for visiting [facility] today.   YOU MAY RESUME YOUR PLAVIX TODAY

## 2023-08-12 ENCOUNTER — Ambulatory Visit
Admission: RE | Admit: 2023-08-12 | Discharge: 2023-08-12 | Disposition: A | Discharge status: Home / Self Care | Payer: 59 | Source: Ambulatory Visit | Attending: Physician Assistant | Admitting: Physician Assistant

## 2023-08-12 DIAGNOSIS — M4722 Other spondylosis with radiculopathy, cervical region: Secondary | ICD-10-CM | POA: Diagnosis not present

## 2023-08-12 DIAGNOSIS — M542 Cervicalgia: Secondary | ICD-10-CM

## 2023-08-12 MED ORDER — TRIAMCINOLONE ACETONIDE 40 MG/ML IJ SUSP (RADIOLOGY)
60.0000 mg | Freq: Once | INTRAMUSCULAR | Status: AC
  Administered 2023-08-12: 60 mg via EPIDURAL

## 2023-08-12 MED ORDER — IOPAMIDOL (ISOVUE-M 300) INJECTION 61%
1.0000 mL | Freq: Once | INTRAMUSCULAR | Status: AC | PRN
  Administered 2023-08-12: 1 mL via EPIDURAL

## 2023-08-21 ENCOUNTER — Other Ambulatory Visit: Payer: Self-pay | Admitting: Nurse Practitioner

## 2023-08-21 DIAGNOSIS — F419 Anxiety disorder, unspecified: Secondary | ICD-10-CM

## 2023-08-22 ENCOUNTER — Other Ambulatory Visit: Payer: Self-pay | Admitting: Cardiovascular Disease

## 2023-08-22 DIAGNOSIS — I2510 Atherosclerotic heart disease of native coronary artery without angina pectoris: Secondary | ICD-10-CM

## 2023-08-22 DIAGNOSIS — I10 Essential (primary) hypertension: Secondary | ICD-10-CM

## 2023-08-22 DIAGNOSIS — E782 Mixed hyperlipidemia: Secondary | ICD-10-CM

## 2023-09-01 ENCOUNTER — Other Ambulatory Visit: Payer: Self-pay | Admitting: Nurse Practitioner

## 2023-09-01 DIAGNOSIS — F32A Depression, unspecified: Secondary | ICD-10-CM

## 2023-09-01 NOTE — Telephone Encounter (Signed)
Please advise [facility]

## 2023-09-13 ENCOUNTER — Ambulatory Visit (INDEPENDENT_AMBULATORY_CARE_PROVIDER_SITE_OTHER): Payer: 59 | Admitting: Nurse Practitioner

## 2023-09-13 ENCOUNTER — Encounter: Payer: Self-pay | Admitting: Nurse Practitioner

## 2023-09-13 VITALS — BP 126/70 | HR 71 | Temp 97.9°F | Wt 239.4 lb

## 2023-09-13 DIAGNOSIS — R202 Paresthesia of skin: Secondary | ICD-10-CM | POA: Diagnosis not present

## 2023-09-13 DIAGNOSIS — Z13220 Encounter for screening for lipoid disorders: Secondary | ICD-10-CM | POA: Diagnosis not present

## 2023-09-13 DIAGNOSIS — R200 Anesthesia of skin: Secondary | ICD-10-CM

## 2023-09-13 DIAGNOSIS — Z125 Encounter for screening for malignant neoplasm of prostate: Secondary | ICD-10-CM

## 2023-09-13 DIAGNOSIS — L0291 Cutaneous abscess, unspecified: Secondary | ICD-10-CM

## 2023-09-13 DIAGNOSIS — R252 Cramp and spasm: Secondary | ICD-10-CM

## 2023-09-13 MED ORDER — TIZANIDINE HCL 4 MG PO TABS: 4.0000 mg | ORAL_TABLET | Freq: Four times a day (QID) | ORAL | 0 refills | Status: DC | PRN

## 2023-09-13 MED ORDER — CEPHALEXIN 500 MG PO CAPS: 500.0000 mg | ORAL_CAPSULE | Freq: Three times a day (TID) | ORAL | 0 refills | Status: AC

## 2023-09-13 NOTE — Progress Notes (Signed)
 Subjective   Patient ID: Jeffrey Winters, male    DOB: 09-23-1965, 58 y.o.   MRN: [id number]  Chief Complaint  Patient presents with   Medical Management of Chronic Issues    I have boils and I need some antibiotics I have bad cramps  My right have the shakes      Referring provider: Jerrlyn Morel, NP  Jeffrey Winters is a 58 y.o. male with Past Medical History: No date: COPD (chronic obstructive pulmonary disease) (HCC) 2017: Coronary artery disease     Comment:  MI s/p PCI to OM2 // s/p MI 07/2019 >> DES to RCA;               residual RPL2 80 // Myoview  08/2019: EF 61, small area of               mod ischemia inf base; Low Risk // Cath 5/23: RCA and OM1              stents ok, mod dz LAD neg RFR, RPLA 80 - med Rx unless               refractory angina 07/12/2019: Hyperlipidemia LDL goal <70     Comment:  Hyperlipidemia No date: Hypertension No date: MI (myocardial infarction) (HCC) No date: PAD (peripheral artery disease) (HCC)     Comment:  LE Art US  08/2019: R SFA 50-74; L SFA 50-74 // s/p R SFA               PTA in 4/21; s/p L SFA PTA in 3/21 (Dr. Katheryne Winters) No date: Tobacco use   HPI  Patient presents today for follow-up visit.  He does complain today of an abscess to his right inner thigh.  He states that it did open up and drain.  But he is requesting an antibiotic to make sure to get rid of the infection.  We will trial Keflex .  Patient is also complaining of tremors to his right hand since heart cath.  We will refer him to neurology for nerve conduction test.  Patient also complains today of frequent cramps to lower extremities.  We will check labs and order tizanidine . Denies f/c/s, n/v/d, hemoptysis, PND, leg swelling Denies chest pain or edema     No Known Allergies   There is no immunization history on file for this patient.  Tobacco History: Social History   Tobacco Use  Smoking Status Every Day   Current packs/day: 1.00   Average packs/day: 1 pack/day  for 7.0 years (7.0 ttl pk-yrs)   Types: Cigarettes  Smokeless Tobacco Never  Tobacco Comments   smokes 1/2 pack per day 06/19/2020   Ready to quit: Yes Counseling given: Yes Tobacco comments: smokes 1/2 pack per day 06/19/2020   Outpatient Encounter Medications as of 09/13/2023  Medication Sig   amLODipine  (NORVASC ) 2.5 MG tablet TAKE 1 TABLET BY MOUTH EVERY DAY   ammonium lactate  (AMLACTIN) 12 % lotion Apply 1 Application topically as needed for dry skin.   ANORO ELLIPTA  62.5-25 MCG/ACT AEPB INHALE 1 PUFF INTO THE LUNGS DAILY AT 6 (SIX) AM.   aspirin  EC 81 MG tablet Take 1 tablet orally daily   atorvastatin  (LIPITOR ) 80 MG tablet TAKE 1 TABLET BY MOUTH EVERY DAY   buPROPion  (WELLBUTRIN  XL) 150 MG 24 hr tablet TAKE 1 TABLET BY MOUTH EVERYDAY AT BEDTIME   cephALEXin  (KEFLEX ) 500 MG capsule Take 1 capsule (500 mg total) by mouth 3 (three) times  daily for 10 days.   clopidogrel  (PLAVIX ) 75 MG tablet TAKE 1 TABLET BY MOUTH EVERY DAY   diclofenac  Sodium (VOLTAREN ) 1 % GEL Apply 4 g topically 4 (four) times daily.   escitalopram  (LEXAPRO ) 10 MG tablet TAKE 1 TABLET BY MOUTH EVERY DAY   famotidine  (PEPCID ) 20 MG tablet Take 1 tablet by mouth 2 times daily   isosorbide  mononitrate (IMDUR ) 60 MG 24 hr tablet TAKE 1.5 TABLETS (90 MG TOTAL) BY MOUTH DAILY.   metoprolol  succinate (TOPROL -XL) 25 MG 24 hr tablet TAKE 1 TABLET (25 MG TOTAL) BY MOUTH DAILY.   nitroGLYCERIN  (NITROSTAT ) 0.4 MG SL tablet Take 1 tablet sublingually every 5 minutes as needed for chest pain   tiZANidine  (ZANAFLEX ) 4 MG tablet Take 1 tablet (4 mg total) by mouth every 6 (six) hours as needed for muscle spasms.   albuterol  (VENTOLIN  HFA) 108 (90 Base) MCG/ACT inhaler INHALE 1-2 PUFFS INTO THE LUNGS EVERY 6 (SIX) HOURS AS NEEDED FOR WHEEZING OR SHORTNESS OF BREATH. (Patient not taking: Reported on 09/13/2023)   fluconazole  (DIFLUCAN ) 150 MG tablet Take 1 tablet (150 mg total) by mouth once a week. (Patient not taking: Reported on  09/13/2023)   methylPREDNISolone  (MEDROL  DOSEPAK) 4 MG TBPK tablet Day 1: 8mg  before breakfast, 4 mg after lunch, 4 mg after supper, and 8 mg at bedtime Day 2: 4 mg before breakfast, 4 mg after lunch, 4 mg  after supper, and 8 mg  at bedtime Day 3:  4 mg  before breakfast, 4 mg  after lunch, 4 mg after supper, and 4 mg  at bedtime Day 4: 4 mg  before breakfast, 4 mg  after lunch, and 4 mg at bedtime Day 5: 4 mg  before breakfast and 4 mg at bedtime Day 6: 4 mg  before breakfast (Patient not taking: Reported on 09/13/2023)   morphine  (MSIR) 15 MG tablet Take 0.5 tablets (7.5 mg total) by mouth every 4 (four) hours as needed. (Patient not taking: Reported on 09/13/2023)   No facility-administered encounter medications on file as of 09/13/2023.    Review of Systems  Review of Systems  Constitutional: Negative.   HENT: Negative.    Cardiovascular: Negative.   Gastrointestinal: Negative.   Musculoskeletal:        Cramps to lower extremities.   Skin:        Abscess to right inner thigh  Allergic/Immunologic: Negative.   Neurological: Negative.   Psychiatric/Behavioral: Negative.       Objective:   BP 126/70   Pulse 71   Temp 97.9 F (36.6 C)   Wt 239 lb 6.4 oz (108.6 kg)   SpO2 98%   BMI 37.50 kg/m   Wt Readings from Last 5 Encounters:  09/13/23 239 lb 6.4 oz (108.6 kg)  06/25/23 239 lb (108.4 kg)  06/07/23 239 lb (108.4 kg)  02/24/23 231 lb 3.2 oz (104.9 kg)  10/09/22 237 lb (107.5 kg)     Physical Exam Vitals and nursing note reviewed.  Constitutional:      General: He is not in acute distress.    Appearance: He is well-developed.  Cardiovascular:     Rate and Rhythm: Normal rate and regular rhythm.  Pulmonary:     Effort: Pulmonary effort is normal.     Breath sounds: Normal breath sounds.  Skin:    General: Skin is warm and dry.          Comments: Abscess to right inner thigh draining small amount of fluid  Neurological:     Mental Status: He is alert and oriented  to person, place, and time.       Assessment & Plan:   Abscess -     Cephalexin ; Take 1 capsule (500 mg total) by mouth 3 (three) times daily for 10 days.  Dispense: 30 capsule; Refill: 0  Cramps, muscle, general -     tiZANidine  HCl; Take 1 tablet (4 mg total) by mouth every 6 (six) hours as needed for muscle spasms.  Dispense: 30 tablet; Refill: 0 -     CBC -     Comprehensive metabolic panel  Numbness and tingling in right hand -     Ambulatory referral to Neurology  Prostate cancer screening -     PSA  Lipid screening -     Lipid panel     Return in about 3 months (around 12/11/2023).   Jeffrey Morel, NP 09/13/2023

## 2023-09-13 NOTE — Patient Instructions (Addendum)
 1. Abscess (Primary)  - cephALEXin  (KEFLEX ) 500 MG capsule; Take 1 capsule (500 mg total) by mouth 3 (three) times daily for 10 days.  Dispense: 30 capsule; Refill: 0  2. Cramps, muscle, general  - tiZANidine  (ZANAFLEX ) 4 MG tablet; Take 1 tablet (4 mg total) by mouth every 6 (six) hours as needed for muscle spasms.  Dispense: 30 tablet; Refill: 0 - CBC - Comprehensive metabolic panel  3. Numbness and tingling in right hand  - Ambulatory referral to Neurology  4. Prostate cancer screening  - PSA  5. Lipid screening  - Lipid Panel  Follow up:  Follow up in 3 months

## 2023-09-20 ENCOUNTER — Other Ambulatory Visit: Payer: 59

## 2023-09-20 DIAGNOSIS — R252 Cramp and spasm: Secondary | ICD-10-CM | POA: Diagnosis not present

## 2023-09-20 DIAGNOSIS — Z13220 Encounter for screening for lipoid disorders: Secondary | ICD-10-CM | POA: Diagnosis not present

## 2023-09-21 LAB — CBC
Hematocrit: 43 % (ref 37.5–51.0)
Hemoglobin: 13.9 g/dL (ref 13.0–17.7)
MCH: 28.4 pg (ref 26.6–33.0)
MCHC: 32.3 g/dL (ref 31.5–35.7)
MCV: 88 fL (ref 79–97)
Platelets: 201 10*3/uL (ref 150–450)
RBC: 4.9 x10E6/uL (ref 4.14–5.80)
RDW: 14.4 % (ref 11.6–15.4)
WBC: 5 10*3/uL (ref 3.4–10.8)

## 2023-09-21 LAB — COMPREHENSIVE METABOLIC PANEL
ALT: 55 [IU]/L — ABNORMAL HIGH (ref 0–44)
AST: 38 [IU]/L (ref 0–40)
Albumin: 4 g/dL (ref 3.8–4.9)
Alkaline Phosphatase: 92 [IU]/L (ref 44–121)
BUN/Creatinine Ratio: 20 (ref 9–20)
BUN: 21 mg/dL (ref 6–24)
Bilirubin Total: 0.3 mg/dL (ref 0.0–1.2)
CO2: 25 mmol/L (ref 20–29)
Calcium: 9.1 mg/dL (ref 8.7–10.2)
Chloride: 101 mmol/L (ref 96–106)
Creatinine, Ser: 1.04 mg/dL (ref 0.76–1.27)
Globulin, Total: 2.4 g/dL (ref 1.5–4.5)
Glucose: 93 mg/dL (ref 70–99)
Potassium: 4.7 mmol/L (ref 3.5–5.2)
Sodium: 140 mmol/L (ref 134–144)
Total Protein: 6.4 g/dL (ref 6.0–8.5)
eGFR: 83 mL/min/{1.73_m2} (ref >59)

## 2023-09-21 LAB — LIPID PANEL
Chol/HDL Ratio: 2.8 {ratio} (ref 0.0–5.0)
Cholesterol, Total: 147 mg/dL (ref 100–199)
HDL: 53 mg/dL (ref >39)
LDL Chol Calc (NIH): 77 mg/dL (ref 0–99)
Triglycerides: 93 mg/dL (ref 0–149)
VLDL Cholesterol Cal: 17 mg/dL (ref 5–40)

## 2023-09-21 LAB — PSA: Prostate Specific Ag, Serum: 0.3 ng/mL (ref 0.0–4.0)

## 2023-09-24 ENCOUNTER — Encounter: Payer: Self-pay | Admitting: Neurology

## 2023-09-24 ENCOUNTER — Other Ambulatory Visit: Payer: Self-pay

## 2023-09-24 DIAGNOSIS — R202 Paresthesia of skin: Secondary | ICD-10-CM

## 2023-10-14 ENCOUNTER — Ambulatory Visit (INDEPENDENT_AMBULATORY_CARE_PROVIDER_SITE_OTHER): Payer: 59 | Admitting: Neurology

## 2023-10-14 DIAGNOSIS — G5601 Carpal tunnel syndrome, right upper limb: Secondary | ICD-10-CM

## 2023-10-14 DIAGNOSIS — M5412 Radiculopathy, cervical region: Secondary | ICD-10-CM

## 2023-10-14 DIAGNOSIS — R202 Paresthesia of skin: Secondary | ICD-10-CM

## 2023-10-14 NOTE — Procedures (Signed)
  [facility]  [address] [place], [address]  [place], [place] [address] Tel: [phone] Fax: [phone] Test Date:  10/14/2023  Patient: Jeffrey Winters DOB: [birth year] Physician: Nita Sickle, DO  Sex: Male Height: 5\' 7"  Ref Phys: Angus Seller, NP  ID#: [id number]   Technician:    History: This is a 58 year old man referred for evaluation of right arm paresthesias.  NCV & EMG Findings: Extensive electrodiagnostic testing of the right upper extremity shows:  Right median sensory response shows prolonged latency (4.0 ms).  Right ulnar sensory response is within normal limits. Right median motor response shows prolonged latency (4.9 ms).  Right ulnar motor responses within normal limits.   Chronic motor axonal loss changes are seen affecting the right biceps and deltoid muscles, without accompanying active denervation.     Impression: Chronic C5-6 radiculopathy affecting the right upper extremity, moderate-to-severe. Right median neuropathy at or distal to the wrist consistent with the clinical diagnosis of carpal tunnel syndrome, moderate in degree electrically.   ___________________________ Nita Sickle, DO    Nerve Conduction Studies   Stim Site NR Peak (ms) Norm Peak (ms) O-P Amp (V) Norm O-P Amp  Right Median Anti Sensory (2nd Digit)  32 C  Wrist    *4.0 <3.6 32.8 >15  Right Ulnar Anti Sensory (5th Digit)  32 C  Wrist    3.0 <3.1 25.8 >10     Stim Site NR Onset (ms) Norm Onset (ms) O-P Amp (mV) Norm O-P Amp Site1 Site2 Delta-0 (ms) Dist (cm) Vel (m/s) Norm Vel (m/s)  Right Median Motor (Abd Poll Brev)  32 C  Wrist    *4.9 <4.0 8.0 >6 Elbow Wrist 6.1 31.0 51 >50  Elbow    11.0  7.5         Right Ulnar Motor (Abd Dig Minimi)  32 C  Wrist    2.7 <3.1 9.2 >7 B Elbow Wrist 4.7 25.0 53 >50  B Elbow    7.4  8.7  A Elbow B Elbow 1.6 10.0 63 >50  A Elbow    9.0  8.0          Electromyography   Side Muscle Ins.Act Fibs Fasc Recrt Amp Dur Poly Activation  Comment  Right 1stDorInt Nml Nml Nml Nml Nml Nml Nml Nml N/A  Right Abd Poll Brev Nml Nml Nml Nml Nml Nml Nml Nml N/A  Right PronatorTeres Nml Nml Nml Nml Nml Nml Nml Nml N/A  Right Biceps Nml Nml Nml *3- *1+ *1+ *1+ Nml N/A  Right Triceps Nml Nml Nml Nml Nml Nml Nml Nml N/A  Right Deltoid Nml Nml Nml *3- *1+ *1+ *1+ Nml N/A      Waveforms:

## 2023-11-01 ENCOUNTER — Telehealth: Payer: Self-pay

## 2023-11-01 NOTE — Telephone Encounter (Unsigned)
 Copied from CRM [id number]. Topic: Clinical - Lab/Test Results >> Nov 01, 2023 11:19 AM Shelah Lewandowsky wrote: Reason for CRM: calling on results from [facility]- they did imaging and said they were sending to Angus Seller- please call patient with results [phone]

## 2023-12-15 ENCOUNTER — Encounter: Payer: Self-pay | Admitting: Nurse Practitioner

## 2023-12-15 ENCOUNTER — Ambulatory Visit (INDEPENDENT_AMBULATORY_CARE_PROVIDER_SITE_OTHER): Payer: Self-pay | Admitting: Nurse Practitioner

## 2023-12-15 ENCOUNTER — Encounter: Payer: Self-pay | Admitting: Neurology

## 2023-12-15 VITALS — BP 116/53 | HR 79 | Temp 98.1°F | Wt 237.0 lb

## 2023-12-15 DIAGNOSIS — M542 Cervicalgia: Secondary | ICD-10-CM

## 2023-12-15 MED ORDER — KETOROLAC TROMETHAMINE 60 MG/2ML IM SOLN
60.0000 mg | Freq: Once | INTRAMUSCULAR | Status: AC
  Administered 2023-12-15: 60 mg via INTRAMUSCULAR

## 2023-12-15 NOTE — Patient Instructions (Signed)
 1. Neck pain on left side (Primary)  - ketorolac  (TORADOL ) injection 60 mg

## 2023-12-15 NOTE — Progress Notes (Unsigned)
 Subjective   Patient ID: Jeffrey Winters, male    DOB: 12/03/1965, 58 y.o.   MRN: [id number]  Chief Complaint  Patient presents with   Follow-up    Referring provider: Jerrlyn Morel, NP  Jeffrey Winters is a 58 y.o. male with Past Medical History: No date: COPD (chronic obstructive pulmonary disease) (HCC) 2017: Coronary artery disease     Comment:  MI s/p PCI to OM2 // s/p MI 07/2019 >> DES to RCA;               residual RPL2 80 // Myoview  08/2019: EF 61, small area of               mod ischemia inf base; Low Risk // Cath 5/23: RCA and OM1              stents ok, mod dz LAD neg RFR, RPLA 80 - med Rx unless               refractory angina 07/12/2019: Hyperlipidemia LDL goal <70     Comment:  Hyperlipidemia No date: Hypertension No date: MI (myocardial infarction) (HCC) No date: PAD (peripheral artery disease) (HCC)     Comment:  LE Art US  08/2019: R SFA 50-74; L SFA 50-74 // s/p R SFA               PTA in 4/21; s/p L SFA PTA in 3/21 (Dr. Katheryne Pane) No date: Tobacco use   HPI  Patient presents today for follow-up visit.  He has followed with orthopedics and did have a injection for right shoulder and neck pain.  He states that since this time he has been having pain on his left side of his neck and shoulder.  We discussed that he does need to follow-up with orthopedics about this.  We will give a Toradol  injection in office today.  He also followed with neurology and did have nerve conduction study which did show cervical radiculopathy and carpal tunnel syndrome.  Neurology did not follow-up with the patient.  We discussed that he does need to call the office to make a follow-up appointment. Denies f/c/s, n/v/d, hemoptysis, PND, leg swelling Denies chest pain or edema     No Known Allergies   There is no immunization history on file for this patient.  Tobacco History: Social History   Tobacco Use  Smoking Status Every Day   Current packs/day: 1.00   Average packs/day: 1  pack/day for 7.0 years (7.0 ttl pk-yrs)   Types: Cigarettes  Smokeless Tobacco Never  Tobacco Comments   smokes 1/2 pack per day 06/19/2020   Ready to quit: Yes Counseling given: Yes Tobacco comments: smokes 1/2 pack per day 06/19/2020   Outpatient Encounter Medications as of 12/15/2023  Medication Sig   amLODipine  (NORVASC ) 2.5 MG tablet TAKE 1 TABLET BY MOUTH EVERY DAY   ammonium lactate  (AMLACTIN) 12 % lotion Apply 1 Application topically as needed for dry skin.   aspirin  EC 81 MG tablet Take 1 tablet orally daily   atorvastatin  (LIPITOR ) 80 MG tablet TAKE 1 TABLET BY MOUTH EVERY DAY   clopidogrel  (PLAVIX ) 75 MG tablet TAKE 1 TABLET BY MOUTH EVERY DAY   escitalopram  (LEXAPRO ) 10 MG tablet TAKE 1 TABLET BY MOUTH EVERY DAY   famotidine  (PEPCID ) 20 MG tablet Take 1 tablet by mouth 2 times daily   isosorbide  mononitrate (IMDUR ) 60 MG 24 hr tablet TAKE 1.5 TABLETS (90 MG TOTAL) BY MOUTH DAILY.  metoprolol  succinate (TOPROL -XL) 25 MG 24 hr tablet TAKE 1 TABLET (25 MG TOTAL) BY MOUTH DAILY.   nitroGLYCERIN  (NITROSTAT ) 0.4 MG SL tablet Take 1 tablet sublingually every 5 minutes as needed for chest pain   tiZANidine  (ZANAFLEX ) 4 MG tablet Take 1 tablet (4 mg total) by mouth every 6 (six) hours as needed for muscle spasms.   albuterol  (VENTOLIN  HFA) 108 (90 Base) MCG/ACT inhaler INHALE 1-2 PUFFS INTO THE LUNGS EVERY 6 (SIX) HOURS AS NEEDED FOR WHEEZING OR SHORTNESS OF BREATH. (Patient not taking: Reported on 12/15/2023)   ANORO ELLIPTA  62.5-25 MCG/ACT AEPB INHALE 1 PUFF INTO THE LUNGS DAILY AT 6 (SIX) AM. (Patient not taking: Reported on 12/15/2023)   buPROPion  (WELLBUTRIN  XL) 150 MG 24 hr tablet TAKE 1 TABLET BY MOUTH EVERYDAY AT BEDTIME (Patient not taking: Reported on 12/15/2023)   diclofenac  Sodium (VOLTAREN ) 1 % GEL Apply 4 g topically 4 (four) times daily. (Patient not taking: Reported on 12/15/2023)   fluconazole  (DIFLUCAN ) 150 MG tablet Take 1 tablet (150 mg total) by mouth once a week.  (Patient not taking: Reported on 09/13/2023)   methylPREDNISolone  (MEDROL  DOSEPAK) 4 MG TBPK tablet Day 1: 8mg  before breakfast, 4 mg after lunch, 4 mg after supper, and 8 mg at bedtime Day 2: 4 mg before breakfast, 4 mg after lunch, 4 mg  after supper, and 8 mg  at bedtime Day 3:  4 mg  before breakfast, 4 mg  after lunch, 4 mg after supper, and 4 mg  at bedtime Day 4: 4 mg  before breakfast, 4 mg  after lunch, and 4 mg at bedtime Day 5: 4 mg  before breakfast and 4 mg at bedtime Day 6: 4 mg  before breakfast (Patient not taking: Reported on 09/13/2023)   morphine  (MSIR) 15 MG tablet Take 0.5 tablets (7.5 mg total) by mouth every 4 (four) hours as needed. (Patient not taking: Reported on 09/13/2023)   Facility-Administered Encounter Medications as of 12/15/2023  Medication   ketorolac  (TORADOL ) injection 60 mg    Review of Systems  Review of Systems  Constitutional: Negative.   HENT: Negative.    Cardiovascular: Negative.   Gastrointestinal: Negative.   Allergic/Immunologic: Negative.   Neurological: Negative.   Psychiatric/Behavioral: Negative.       Objective:   BP (!) 116/53   Pulse 79   Temp 98.1 F (36.7 C) (Oral)   Wt 237 lb (107.5 kg)   SpO2 95%   BMI 37.12 kg/m   Wt Readings from Last 5 Encounters:  12/15/23 237 lb (107.5 kg)  09/13/23 239 lb 6.4 oz (108.6 kg)  06/25/23 239 lb (108.4 kg)  06/07/23 239 lb (108.4 kg)  02/24/23 231 lb 3.2 oz (104.9 kg)     Physical Exam Vitals and nursing note reviewed.  Constitutional:      General: He is not in acute distress.    Appearance: He is well-developed.  Cardiovascular:     Rate and Rhythm: Normal rate and regular rhythm.  Pulmonary:     Effort: Pulmonary effort is normal.     Breath sounds: Normal breath sounds.  Skin:    General: Skin is warm and dry.  Neurological:     Mental Status: He is alert and oriented to person, place, and time.     {Labs (Optional):23779}  Assessment & Plan:   Neck pain on left  side -     Ketorolac  Tromethamine      Return in about 3 months (around 03/16/2024).  Jerrlyn Morel, NP 12/15/2023

## 2023-12-16 ENCOUNTER — Telehealth: Payer: Self-pay | Admitting: Nurse Practitioner

## 2023-12-16 NOTE — Telephone Encounter (Signed)
 Copied from CRM [id number]. Topic: Referral - Status >> Dec 16, 2023  1:54 PM Zipporah Him wrote: Reason for CRM:  Patient is needing a referral over to [facility], he states Abbey Hobby advised him to call over to that office, he says that when he called they said they needed a referral sent over. Patient requests a call back to discuss.

## 2023-12-20 ENCOUNTER — Telehealth: Payer: Self-pay

## 2023-12-20 NOTE — Telephone Encounter (Signed)
 Copied from CRM [id number]. Topic: Appointments - Transfer of Care >> Dec 20, 2023 11:41 AM Sophia H wrote: Pt is requesting to transfer FROM: NP. TONYA NICHOLS [facility] Pt is requesting to transfer TO: DR. Maryjane Snider [facility] Reason for requested transfer: Pt states NP Tonya not following up with referrals as he feels she should, req new provider.  It is the responsibility of the team the patient would like to transfer to (Dr. Arabella Beach) to reach out to the patient if for any reason this transfer is not acceptable.  This is not fair to us  . This is [facility] department. Please advise if I need to reach out or just wait for records request.

## 2024-01-04 DIAGNOSIS — B356 Tinea cruris: Secondary | ICD-10-CM | POA: Diagnosis not present

## 2024-01-04 DIAGNOSIS — L739 Follicular disorder, unspecified: Secondary | ICD-10-CM | POA: Diagnosis not present

## 2024-01-10 ENCOUNTER — Emergency Department (HOSPITAL_COMMUNITY)
Admission: EM | Admit: 2024-01-10 | Discharge: 2024-01-10 | Disposition: A | Discharge status: Home / Self Care | Attending: Emergency Medicine | Admitting: Emergency Medicine

## 2024-01-10 ENCOUNTER — Other Ambulatory Visit: Payer: Self-pay

## 2024-01-10 ENCOUNTER — Encounter (HOSPITAL_COMMUNITY): Payer: Self-pay

## 2024-01-10 DIAGNOSIS — L249 Irritant contact dermatitis, unspecified cause: Secondary | ICD-10-CM | POA: Diagnosis not present

## 2024-01-10 DIAGNOSIS — B369 Superficial mycosis, unspecified: Secondary | ICD-10-CM

## 2024-01-10 DIAGNOSIS — B359 Dermatophytosis, unspecified: Secondary | ICD-10-CM | POA: Insufficient documentation

## 2024-01-10 DIAGNOSIS — Z7902 Long term (current) use of antithrombotics/antiplatelets: Secondary | ICD-10-CM | POA: Insufficient documentation

## 2024-01-10 DIAGNOSIS — R21 Rash and other nonspecific skin eruption: Secondary | ICD-10-CM | POA: Diagnosis present

## 2024-01-10 DIAGNOSIS — Z7982 Long term (current) use of aspirin: Secondary | ICD-10-CM | POA: Insufficient documentation

## 2024-01-10 LAB — CBG MONITORING, ED: Glucose-Capillary: 103 mg/dL — ABNORMAL HIGH (ref 70–99)

## 2024-01-10 MED ORDER — FLUCONAZOLE 100 MG PO TABS: 100.0000 mg | ORAL_TABLET | Freq: Every day | ORAL | 0 refills | Status: AC

## 2024-01-10 MED ORDER — PREDNISONE 10 MG PO TABS
ORAL_TABLET | ORAL | 0 refills | Status: DC
Start: 2024-01-10 — End: 2024-03-29

## 2024-01-10 NOTE — ED Triage Notes (Signed)
 Pt c/o itching and rash on arms, legs and torso that started 2 weeks ago. Pt has large, reddened, raised area on left side under arm. Pt unsure if he was exposed to poison oak/ivy or bit by something. Pt seen at other facility for same.

## 2024-01-10 NOTE — ED Notes (Signed)
Pt called [facility] with no response

## 2024-01-10 NOTE — Discharge Instructions (Addendum)
 Follow up with your Physician for recheck.  Wash all your clothes in water with out soap or Clorox.

## 2024-01-10 NOTE — ED Provider Notes (Signed)
 [place] EMERGENCY DEPARTMENT AT [facility] Provider Note   CSN: [id number] Arrival date & time: 01/10/24  1126     History  Chief Complaint  Patient presents with   Rash    Jeffrey Winters is a 58 y.o. male.  Patient complains of a full body rash.  Patient reports that the rash began 3 weeks ago.  He complains of severe fear itching full body.  Patient saw his primary care physician and was treated with doxycycline.  He does not think he had any exposures to poison oak or poison ivy he does not recall removing any ticks.  Patient states that he had had a fever.  Patient reports that he washed his close in a gallon of Clorox.  He states he did this prior to the rash.  Patient states he has been seen at the health department and had STD screening.  He reports his screening was negative.  He saw his primary care physician but the medications have not given him any relief.  The history is provided by the patient. No language interpreter was used.  Rash      Home Medications Prior to Admission medications   Medication Sig Start Date End Date Taking? Authorizing Provider  fluconazole  (DIFLUCAN ) 100 MG tablet Take 1 tablet (100 mg total) by mouth daily. 01/10/24 02/09/24 Yes Liliana Brentlinger K, PA-C  predniSONE  (DELTASONE ) 10 MG tablet Taper 6,6,5,5,4,4,3,3,2,2,1,1 01/10/24  Yes Kamron Vanwyhe K, PA-C  albuterol  (VENTOLIN  HFA) 108 (90 Base) MCG/ACT inhaler INHALE 1-2 PUFFS INTO THE LUNGS EVERY 6 (SIX) HOURS AS NEEDED FOR WHEEZING OR SHORTNESS OF BREATH. Patient not taking: Reported on 12/15/2023 12/26/21 06/25/23  Jerrlyn Morel, NP  amLODipine  (NORVASC ) 2.5 MG tablet TAKE 1 TABLET BY MOUTH EVERY DAY 07/07/23   Marlyse Single T, PA-C  ammonium lactate  (AMLACTIN) 12 % lotion Apply 1 Application topically as needed for dry skin. 08/13/22   Jerrlyn Morel, NP  ANORO ELLIPTA  62.5-25 MCG/ACT AEPB INHALE 1 PUFF INTO THE LUNGS DAILY AT 6 (SIX) AM. Patient not taking: Reported on 12/15/2023  04/14/23   Jerrlyn Morel, NP  aspirin  EC 81 MG tablet Take 1 tablet orally daily 03/13/21     atorvastatin  (LIPITOR ) 80 MG tablet TAKE 1 TABLET BY MOUTH EVERY DAY 04/14/23   Meng, Hao, PA  buPROPion  (WELLBUTRIN  XL) 150 MG 24 hr tablet TAKE 1 TABLET BY MOUTH EVERYDAY AT BEDTIME Patient not taking: Reported on 12/15/2023 09/01/23   Jerrlyn Morel, NP  clopidogrel  (PLAVIX ) 75 MG tablet TAKE 1 TABLET BY MOUTH EVERY DAY 04/14/23   Meng, Hao, PA  diclofenac  Sodium (VOLTAREN ) 1 % GEL Apply 4 g topically 4 (four) times daily. Patient not taking: Reported on 12/15/2023 06/21/23   Albertus Hughs, DO  escitalopram  (LEXAPRO ) 10 MG tablet TAKE 1 TABLET BY MOUTH EVERY DAY 08/23/23   Nichols, Tonya S, NP  famotidine  (PEPCID ) 20 MG tablet Take 1 tablet by mouth 2 times daily 03/13/21     isosorbide  mononitrate (IMDUR ) 60 MG 24 hr tablet TAKE 1.5 TABLETS (90 MG TOTAL) BY MOUTH DAILY. 02/26/23   Meng, Hao, PA  metoprolol  succinate (TOPROL -XL) 25 MG 24 hr tablet TAKE 1 TABLET (25 MG TOTAL) BY MOUTH DAILY. 08/24/23   Arnoldo Lapping, MD  morphine  (MSIR) 15 MG tablet Take 0.5 tablets (7.5 mg total) by mouth every 4 (four) hours as needed. Patient not taking: Reported on 09/13/2023 06/22/23   Albertus Hughs, DO  nitroGLYCERIN  (NITROSTAT ) 0.4 MG SL  tablet Take 1 tablet sublingually every 5 minutes as needed for chest pain 05/20/22   Marlyse Single T, PA-C  tiZANidine  (ZANAFLEX ) 4 MG tablet Take 1 tablet (4 mg total) by mouth every 6 (six) hours as needed for muscle spasms. 09/13/23   Jerrlyn Morel, NP      Allergies    Patient has no known allergies.    Review of Systems   Review of Systems  Skin:  Positive for rash.  All other systems reviewed and are negative.   Physical Exam Updated Vital Signs BP 121/70 (BP Location: Right Arm)   Pulse 83   Temp 98.8 F (37.1 C) (Oral)   Resp 17   Ht 5\' 8"  (1.727 m)   Wt 104.3 kg   SpO2 96%   BMI 34.97 kg/m  Physical Exam Vitals and nursing note reviewed.  Constitutional:       Appearance: He is well-developed.  HENT:     Head: Normocephalic.  Cardiovascular:     Rate and Rhythm: Normal rate.  Pulmonary:     Effort: Pulmonary effort is normal.  Abdominal:     General: There is no distension.  Musculoskeletal:        General: Normal range of motion.     Cervical back: Normal range of motion.  Skin:    Findings: Erythema present.     Comments: Raised erythematous rash arms legs chest and abdomen, head, neck and palms are spared.  Patient has white discolored ringlike rash bilateral axilla.  Neurological:     General: No focal deficit present.     Mental Status: He is alert and oriented to person, place, and time.     ED Results / Procedures / Treatments   Labs (all labs ordered are listed, but only abnormal results are displayed) Labs Reviewed  CBG MONITORING, ED - Abnormal; Notable for the following components:      Result Value   Glucose-Capillary 103 (*)    All other components within normal limits    EKG None  Radiology No results found.  Procedures Procedures    Medications Ordered in ED Medications - No data to display  ED Course/ Medical Decision Making/ A&P                                 Medical Decision Making Patient complains of rash for the past 3 weeks he has been on doxycycline.  Patient was started on doxycycline due to the rash.  Amount and/or Complexity of Data Reviewed Discussion of management or test interpretation with external provider(s): Dr. Lorelie Rohrer evaluated pt.   Risk Prescription drug management. Risk Details: Patient counseled on rash.  He appears to have a fungal infection in the axilla.  Rash on the body more consistent with contact dermatitis.  Patient is given a prescription for prednisone  and Diflucan .  He is advised to finish the doxycycline that he has been taking follow-up with his primary care physician for recheck dermatology if needed.           Final Clinical Impression(s) / ED  Diagnoses Final diagnoses:  Irritant contact dermatitis, unspecified trigger  Fungal dermatitis    Rx / DC Orders ED Discharge Orders          Ordered    fluconazole  (DIFLUCAN ) 100 MG tablet  Daily        01/10/24 1356    predniSONE  (DELTASONE ) 10 MG tablet  Note to Pharmacy: Please provide 12 day dose pack   01/10/24 1356           An After Visit Summary was printed and given to the patient.    Sandi Crosby, [place] 01/10/24 1432    Tegeler, Marine Sia, MD 01/10/24 [phone]

## 2024-01-23 NOTE — Progress Notes (Signed)
 OFFICE NOTE:    Date:  01/25/2024  ID:  Jeffrey Winters, DOB 10-23-1965, MRN [id number] PCP: Oley Bascom RAMAN, NP  [facility] Providers Cardiologist:  Ozell Fell, MD Cardiology APP:  Lelon Glendia ONEIDA DEVONNA  PV Cardiologist:  Dorn Lesches, MD       Patient Profile:  Coronary artery disease S/p NSTEMI in 02/2016 tx with 2.25 x 18 mm Xience DES to OM1 s/p NSTEMI in 05/2016 S/p Inf STEMI 07/2019 >> s/p 3 x 38 mm DES to RCA; OM2 stent patent Residual RPL2 80% >> Med Rx Myoview  08/2019: small inf ischemia; low risk >> med Rx continued Cath 12/2021: Patent stent in the RCA, OM, moderate disease in the LAD negative by RFR, R PLA 80-med Rx (consider PCI of R PLA if refractory angina) TTE 12/10/21: EF 55, no RWMA, normal RVSF, RVSP 39.8, mildly elevated PASP, mild LAE, trivial MR  Limited TTE 06/05/22: EF 60-65, NL RVSF, mild LAE Peripheral Arterial Disease   S/p L SFA PTA 10/2019 S/p R SFA PTA 11/2019 US  02/19/23: R SFA 50-74, R mid SFA stent patent; L SFA 50-74, L mid SFA stent patent  Hypertension  Hyperlipidemia  Tobacco use COPD      Discussed the use of AI scribe software for clinical note transcription with the patient, who gave verbal consent to proceed. History of Present Illness Jeffrey Winters is a 58 y.o. male who returns for follow up of CAD, PAD. He was last seen in 06/2023 by Jackee Alberts, NP.    He is here alone. He experiences intermittent 'heavy breathing' and being 'out of breath' over the last three to four months, particularly with heavy lifting and heat exposure, which he tries to avoid. No chest pain, heaviness, pressure, or tightness. He uses his inhalers, which he feels help with his symptoms. He continues to smoke and experiences wheezing and coughing. His legs feel 'pretty good'. He reports numbness in his hands, particularly in the thumb and first two fingers, and experiences shaking in his right hand. He does not currently wear a brace at night. No leg  swelling, black stools, bloody urine, or passing out. He uses one pillow at night and sleeps on his side.      Review of Systems  Respiratory:  Positive for shortness of breath and wheezing.   -See HPI    Studies Reviewed:      Results LABS Total cholesterol: 147 (09/20/2023) HDL: 53 (09/20/2023) LDL: 77 (09/20/2023) Triglycerides: 93 (09/20/2023) Creatinine: 1.04 (09/20/2023) Potassium: 4.7 (09/20/2023) ALT: 55 (09/20/2023)  Risk Assessment/Calculations:         Physical Exam:  VS:  BP 110/60   Pulse 74   Ht 5' 7 (1.702 m)   Wt 232 lb (105.2 kg)   SpO2 97%   BMI 36.34 kg/m        Wt Readings from Last 3 Encounters:  01/25/24 232 lb (105.2 kg)  01/10/24 230 lb (104.3 kg)  12/15/23 237 lb (107.5 kg)    Constitutional:      Appearance: Healthy appearance. Not in distress.  Neck:     Vascular: JVD normal.  Pulmonary:     Breath sounds: Normal breath sounds. No wheezing. No rales.  Cardiovascular:     Normal rate. Regular rhythm.     Murmurs: There is no murmur.  Edema:    Peripheral edema absent.  Abdominal:     Palpations: Abdomen is soft.        Assessment and  Plan:    Assessment & Plan Coronary artery disease involving native coronary artery of native heart with angina pectoris (HCC) History of non-STEMI 02/2016 treated a DES to OM1.  He had another non-STEMI in 05/2016 that was managed medically. He had an inf STEMI in 07/2019 tx with DES to RCA. Myoview  in 08/2019 was low risk. He had a cath 12/2021 that demonstrated patent stents in the RCA and OM1. He has residual 80% stenosis in the RPLA that has remained unchanged and managed medically. It was noted at his last cath that the RPLA could be approached with PCI if he has refractory angina.  He is overall doing well without chest symptoms to suggest angina. - Continue amlodipine  2.5 mg daily - Continue aspirin  81 mg daily - Continue atorvastatin  (Lipitor ) 80 mg daily - Continue clopidogrel  (Plavix ) 75 mg  daily - Continue isosorbide  mononitrate (Imdur ) 90 mg daily - Continue metoprolol  succinate (Toprol -XL) 25 mg daily - Continue nitroglycerin  as needed PAD (peripheral artery disease) (HCC) History of left and right SFA angioplasty in 2021.  Recent ultrasound with PTA sites.  He has not had any symptoms of claudication. Essential hypertension Blood pressure controlled. - Continue amlodipine  2.5 mg daily - Continue isosorbide  mononitrate (Imdur ) 60 mg daily - Continue metoprolol  succinate (Toprol -XL) 25 mg daily Hyperlipidemia LDL goal <70 LDL in February 2025 was 77.  Ideally, his LDL should be less than 55.  He has a history of elevated LP(a). - Continue atorvastatin  (Lipitor ) 80 mg daily - Obtain direct LDL today - If LDL is significantly above 55, consider referral to PharmD clinic for PCSK9 inhibitor - If LDL is close to goal, consider ezetimibe 10 mg daily Tobacco abuse I have again recommended cessation. Chronic obstructive pulmonary disease, unspecified COPD type (HCC) I suspect his shortness of breath is mainly related to COPD.        Dispo:  Return in about 6 months (around 07/26/2024) for Routine Follow Up, w/ Dr. Wonda, or Glendia Ferrier, PA-C.  Signed, Glendia Ferrier, PA-C

## 2024-01-25 ENCOUNTER — Ambulatory Visit: Attending: Physician Assistant | Admitting: Physician Assistant

## 2024-01-25 ENCOUNTER — Ambulatory Visit: Admitting: Physician Assistant

## 2024-01-25 ENCOUNTER — Encounter: Payer: Self-pay | Admitting: Physician Assistant

## 2024-01-25 VITALS — BP 110/60 | HR 74 | Ht 67.0 in | Wt 232.0 lb

## 2024-01-25 DIAGNOSIS — I25119 Atherosclerotic heart disease of native coronary artery with unspecified angina pectoris: Secondary | ICD-10-CM

## 2024-01-25 DIAGNOSIS — Z720 Tobacco use: Secondary | ICD-10-CM | POA: Diagnosis not present

## 2024-01-25 DIAGNOSIS — I739 Peripheral vascular disease, unspecified: Secondary | ICD-10-CM | POA: Diagnosis not present

## 2024-01-25 DIAGNOSIS — I10 Essential (primary) hypertension: Secondary | ICD-10-CM

## 2024-01-25 DIAGNOSIS — E785 Hyperlipidemia, unspecified: Secondary | ICD-10-CM

## 2024-01-25 DIAGNOSIS — J449 Chronic obstructive pulmonary disease, unspecified: Secondary | ICD-10-CM | POA: Diagnosis not present

## 2024-01-25 NOTE — Assessment & Plan Note (Signed)
 LDL in February 2025 was 77.  Ideally, his LDL should be less than 55.  He has a history of elevated LP(a). - Continue atorvastatin  (Lipitor ) 80 mg daily - Obtain direct LDL today - If LDL is significantly above 55, consider referral to PharmD clinic for PCSK9 inhibitor - If LDL is close to goal, consider ezetimibe 10 mg daily

## 2024-01-25 NOTE — Assessment & Plan Note (Signed)
 History of non-STEMI 02/2016 treated a DES to OM1.  He had another non-STEMI in 05/2016 that was managed medically. He had an inf STEMI in 07/2019 tx with DES to RCA. Myoview  in 08/2019 was low risk. He had a cath 12/2021 that demonstrated patent stents in the RCA and OM1. He has residual 80% stenosis in the RPLA that has remained unchanged and managed medically. It was noted at his last cath that the RPLA could be approached with PCI if he has refractory angina.  He is overall doing well without chest symptoms to suggest angina. - Continue amlodipine  2.5 mg daily - Continue aspirin  81 mg daily - Continue atorvastatin  (Lipitor ) 80 mg daily - Continue clopidogrel  (Plavix ) 75 mg daily - Continue isosorbide  mononitrate (Imdur ) 90 mg daily - Continue metoprolol  succinate (Toprol -XL) 25 mg daily - Continue nitroglycerin  as needed

## 2024-01-25 NOTE — Assessment & Plan Note (Signed)
 Blood pressure controlled. - Continue amlodipine  2.5 mg daily - Continue isosorbide  mononitrate (Imdur ) 60 mg daily - Continue metoprolol  succinate (Toprol -XL) 25 mg daily

## 2024-01-25 NOTE — Assessment & Plan Note (Signed)
 History of left and right SFA angioplasty in 2021.  Recent ultrasound with PTA sites.  He has not had any symptoms of claudication.

## 2024-01-25 NOTE — Assessment & Plan Note (Signed)
 I suspect his shortness of breath is mainly related to COPD.

## 2024-01-25 NOTE — Patient Instructions (Addendum)
 Medication Instructions:  Your physician recommends that you continue on your current medications as directed. Please refer to the Current Medication list given to you today.  *If you need a refill on your cardiac medications before your next appointment, please call your pharmacy*  Lab Work: TODAY:  LDL  If you have labs (blood work) drawn today and your tests are completely normal, you will receive your results only by: MyChart Message (if you have MyChart) OR A paper copy in the mail If you have any lab test that is abnormal or we need to change your treatment, we will call you to review the results.  Testing/Procedures: None ordered  Follow-Up: At [facility], you and your health needs are our priority.  As part of our continuing mission to provide you with exceptional heart care, our providers are all part of one team.  This team includes your primary Cardiologist (physician) and Advanced Practice Providers or APPs (Physician Assistants and Nurse Practitioners) who all work together to provide you with the care you need, when you need it.  Your next appointment:   6 month(s)  Provider:   Ozell Fell, MD or Glendia Ferrier, PA-C          We recommend signing up for the patient portal called MyChart.  Sign up information is provided on this After Visit Summary.  MyChart is used to connect with patients for Virtual Visits (Telemedicine).  Patients are able to view lab/test results, encounter notes, upcoming appointments, etc.  Non-urgent messages can be sent to your provider as well.   To learn more about what you can do with MyChart, go to [email or web address].   Other Instructions

## 2024-01-25 NOTE — Assessment & Plan Note (Signed)
I have again recommended cessation. 

## 2024-01-26 ENCOUNTER — Ambulatory Visit: Payer: Self-pay | Admitting: Physician Assistant

## 2024-01-26 DIAGNOSIS — E785 Hyperlipidemia, unspecified: Secondary | ICD-10-CM

## 2024-01-26 LAB — LDL CHOLESTEROL, DIRECT: LDL Direct: 86 mg/dL (ref 0–99)

## 2024-02-10 ENCOUNTER — Other Ambulatory Visit: Payer: Self-pay | Admitting: Nurse Practitioner

## 2024-02-10 ENCOUNTER — Other Ambulatory Visit: Payer: Self-pay | Admitting: Physician Assistant

## 2024-02-10 DIAGNOSIS — F419 Anxiety disorder, unspecified: Secondary | ICD-10-CM

## 2024-02-12 IMAGING — DX DG SHOULDER 2+V*R*
4 series · 4 of 4 positions shown · non-contrast
Comparison: None Available.

CLINICAL DATA: Pain

EXAM:
RIGHT SHOULDER - 3 VIEW

[w shoulder external right (1 of 2)]
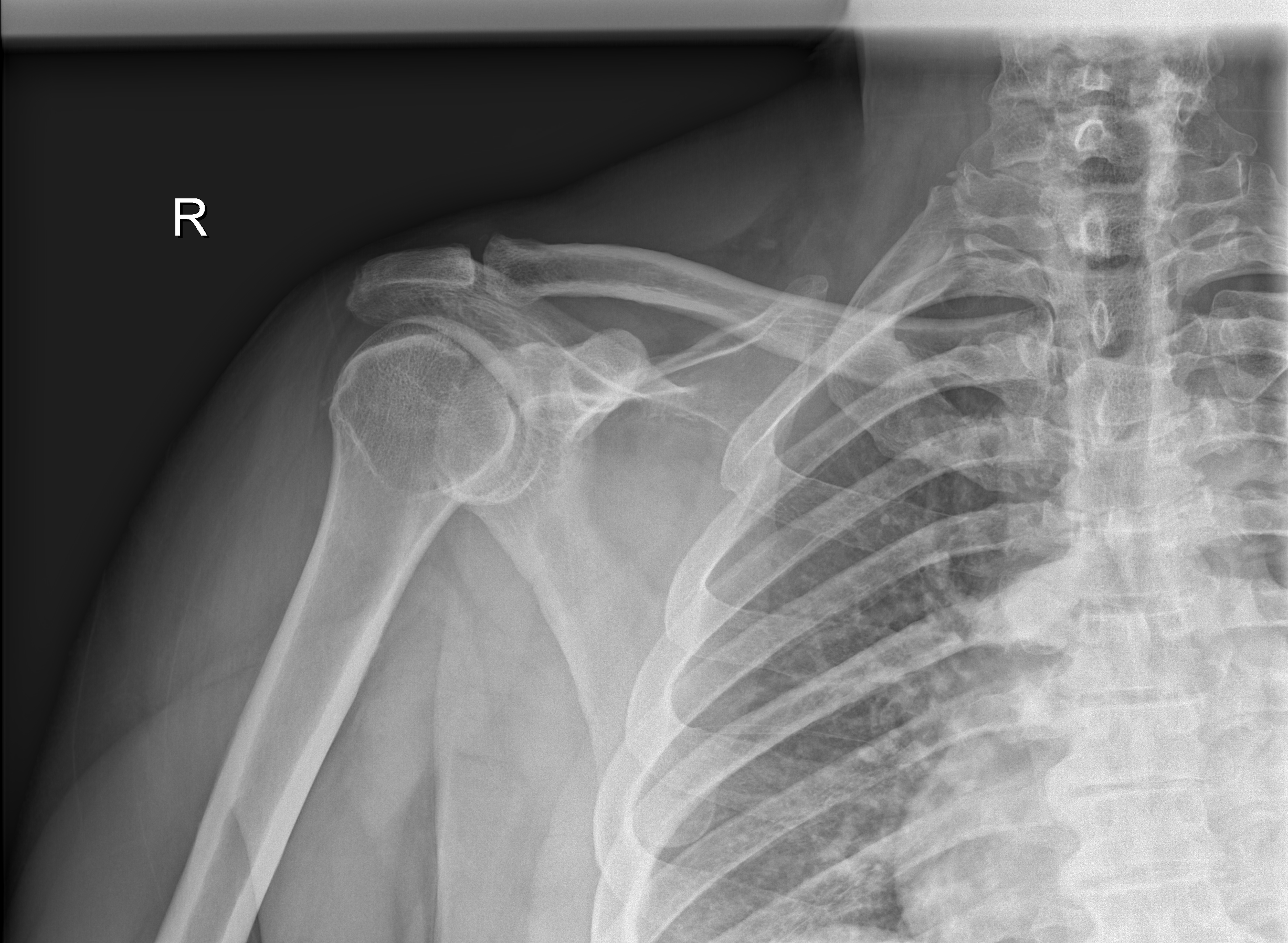

[w shoulder external right (2 of 2)]
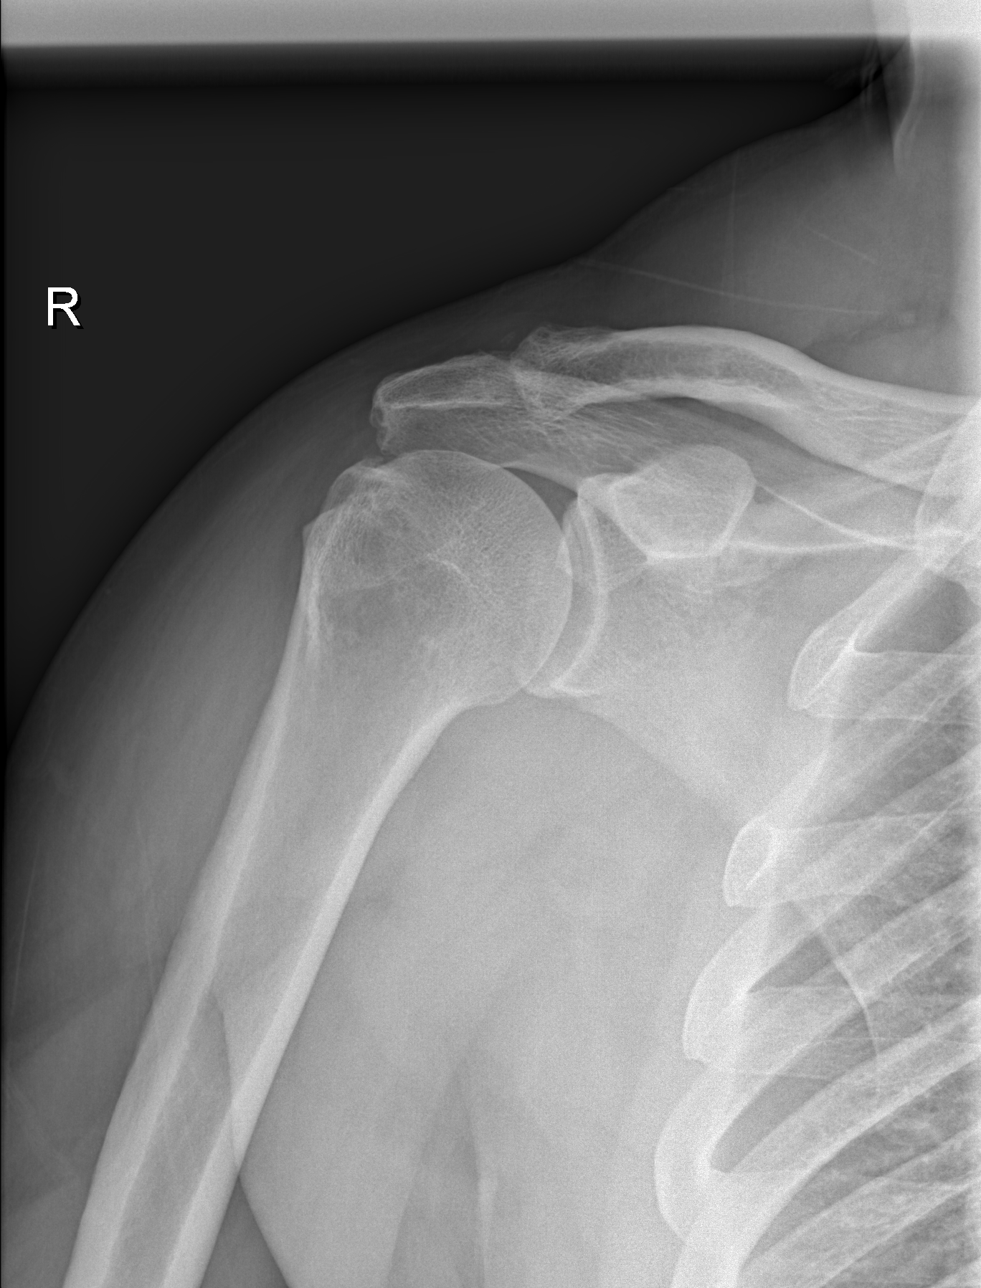

[w shoulder y-view right]
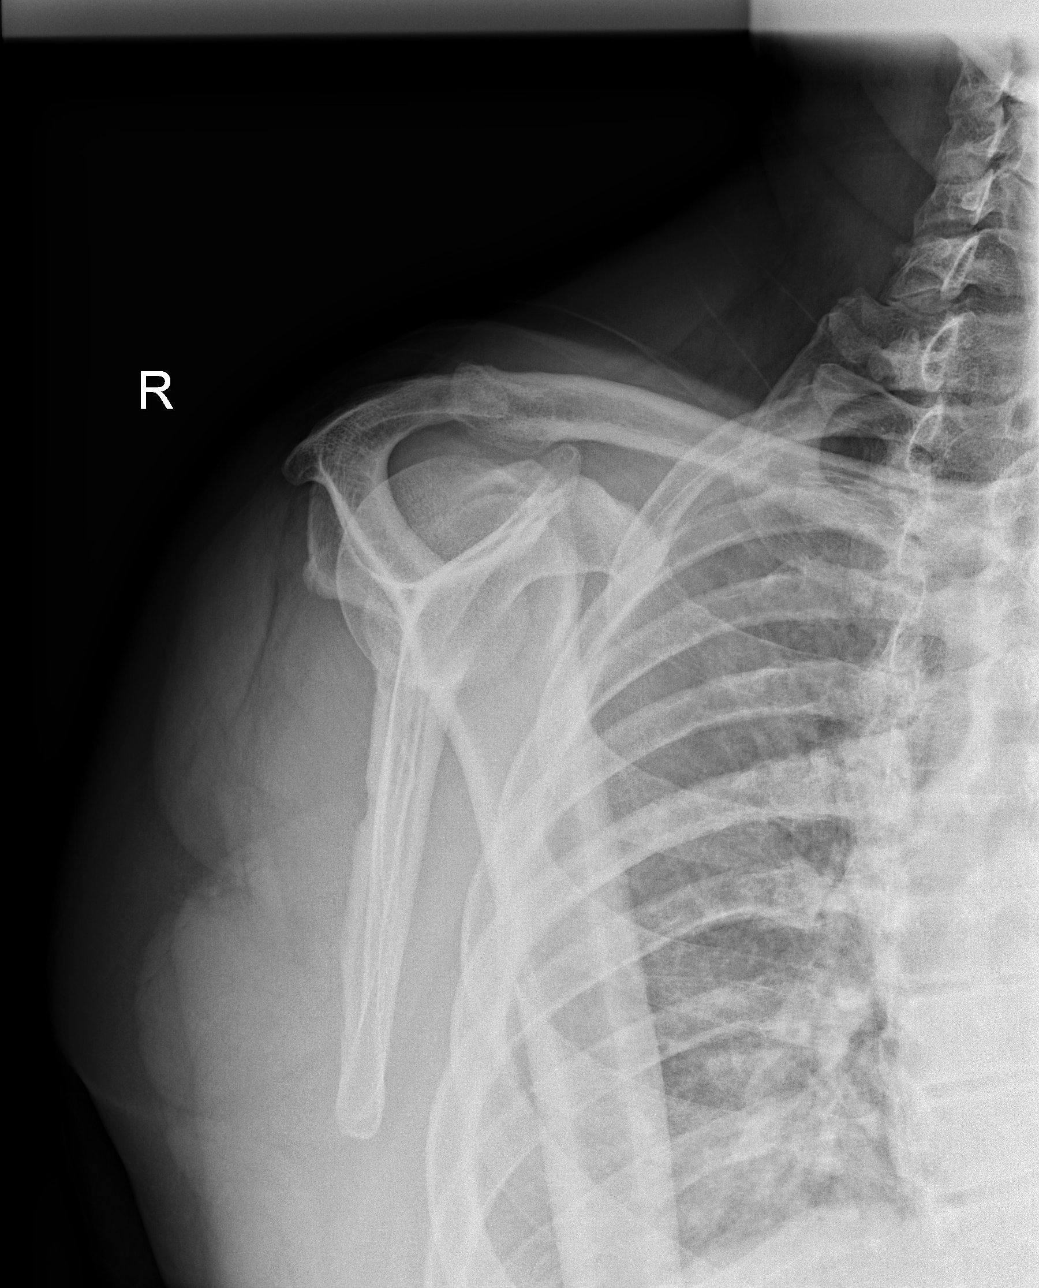

[x shoulder axillary right]
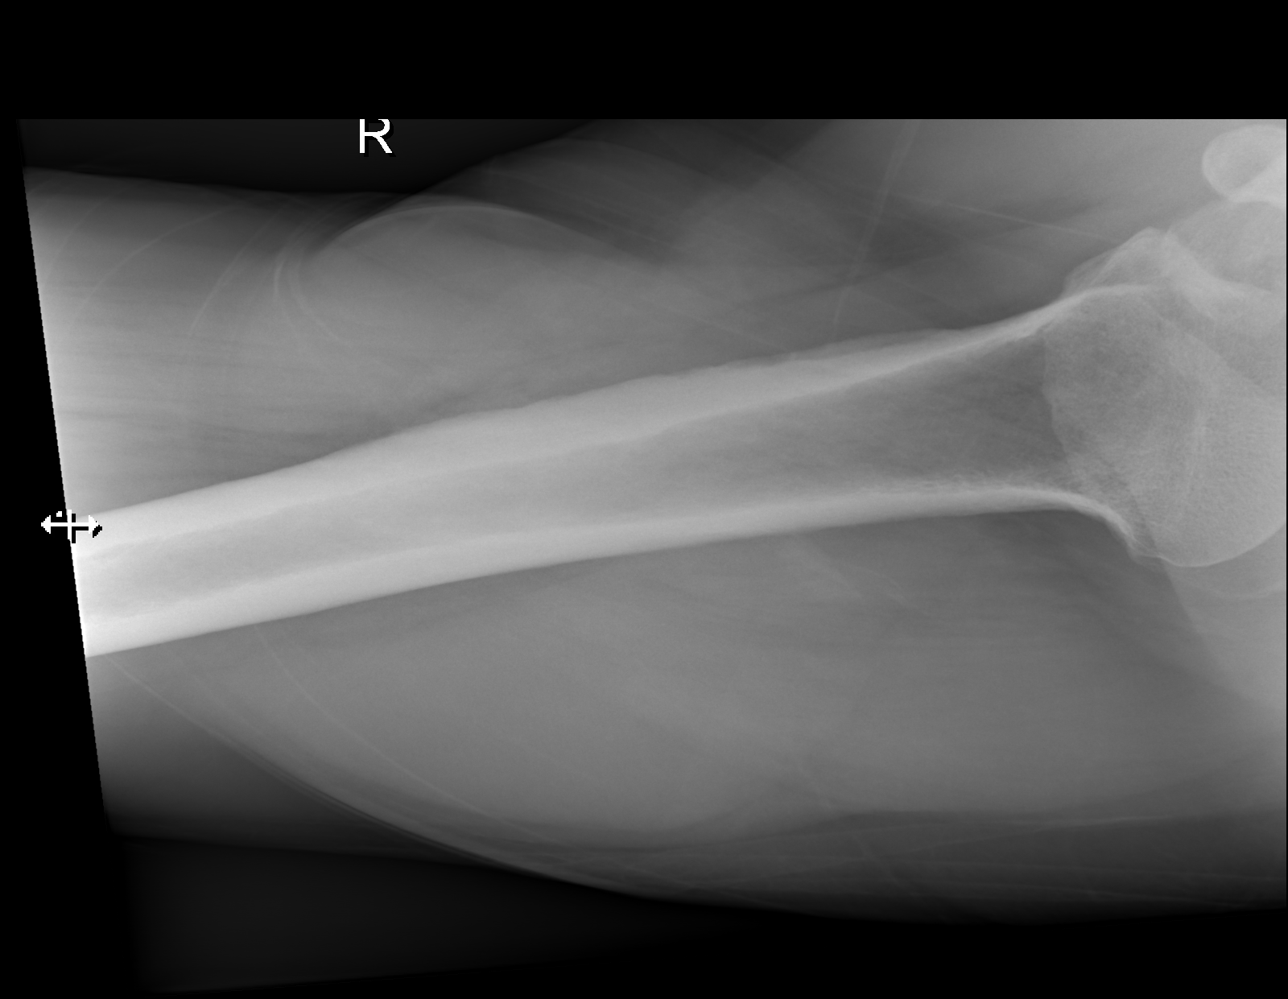

[4 of 4 positions shown; findings below may reference images not displayed]

FINDINGS: There is no evidence of fracture or dislocation. There is no
evidence of arthropathy or other focal bone abnormality. Soft
tissues are unremarkable.
IMPRESSION: Negative.

## 2024-02-12 IMAGING — DX DG CERVICAL SPINE COMPLETE 4+V
6 series · 6 of 6 positions shown · non-contrast
Comparison: None Available.

CLINICAL DATA: Neck pain

EXAM:
CERVICAL SPINE - COMPLETE 4+ VIEW

[w cervical spine lat]
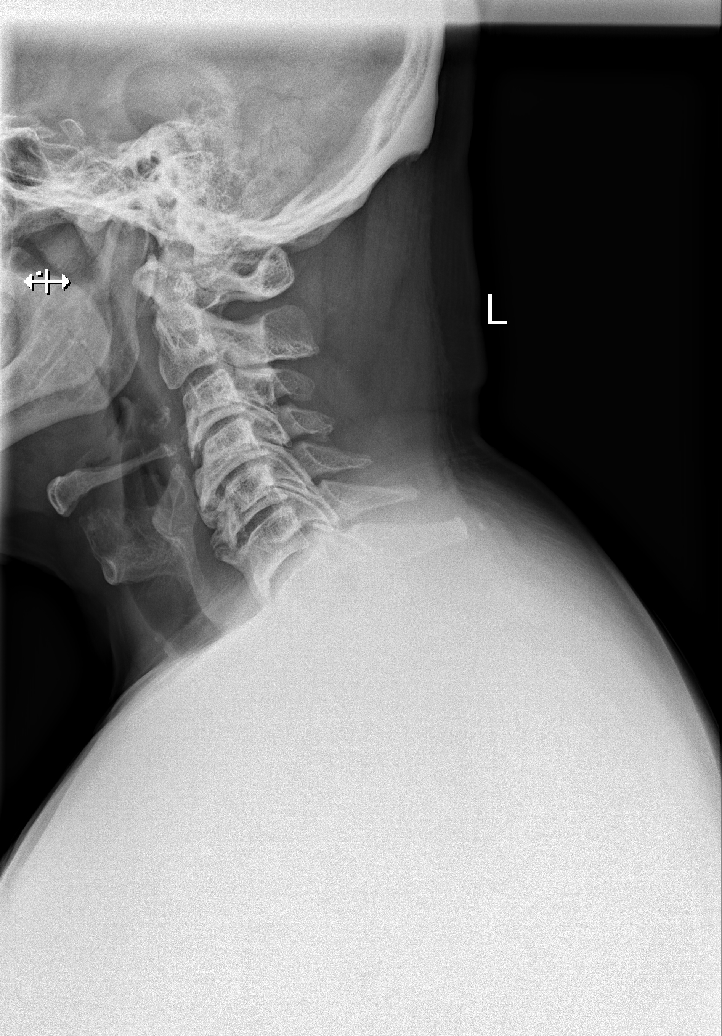

[w cervical spine ap_obl (1 of 2)]
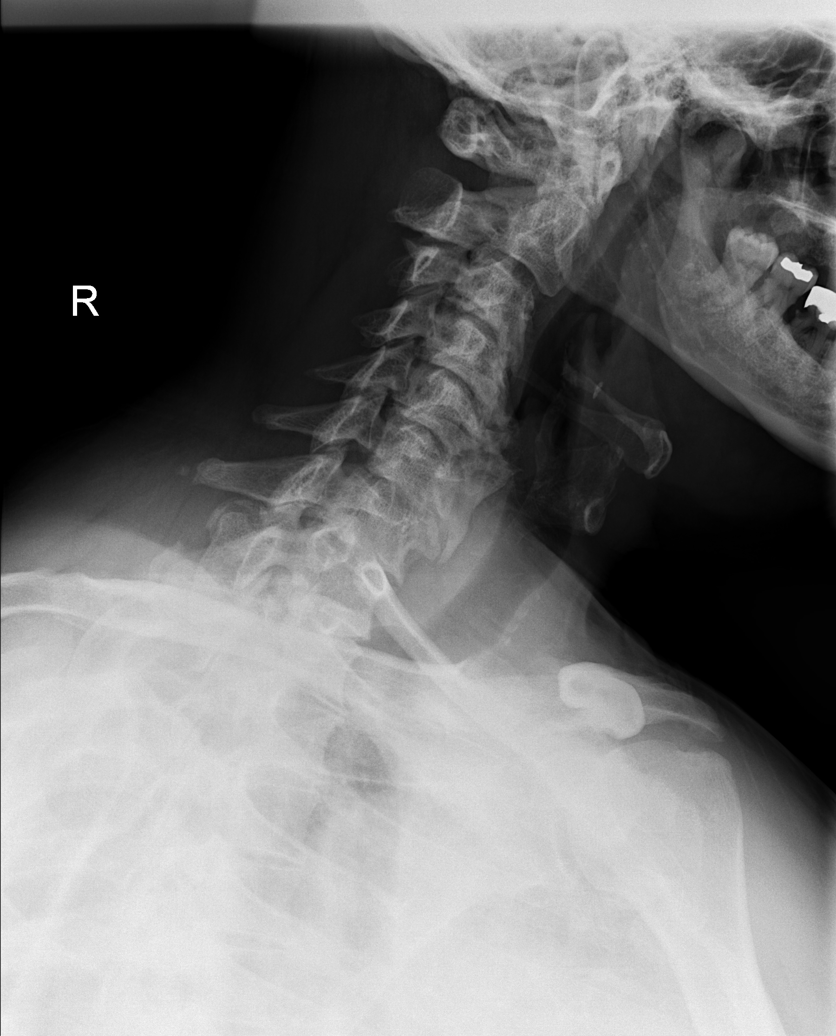

[w cervical spine ap_obl (2 of 2)]
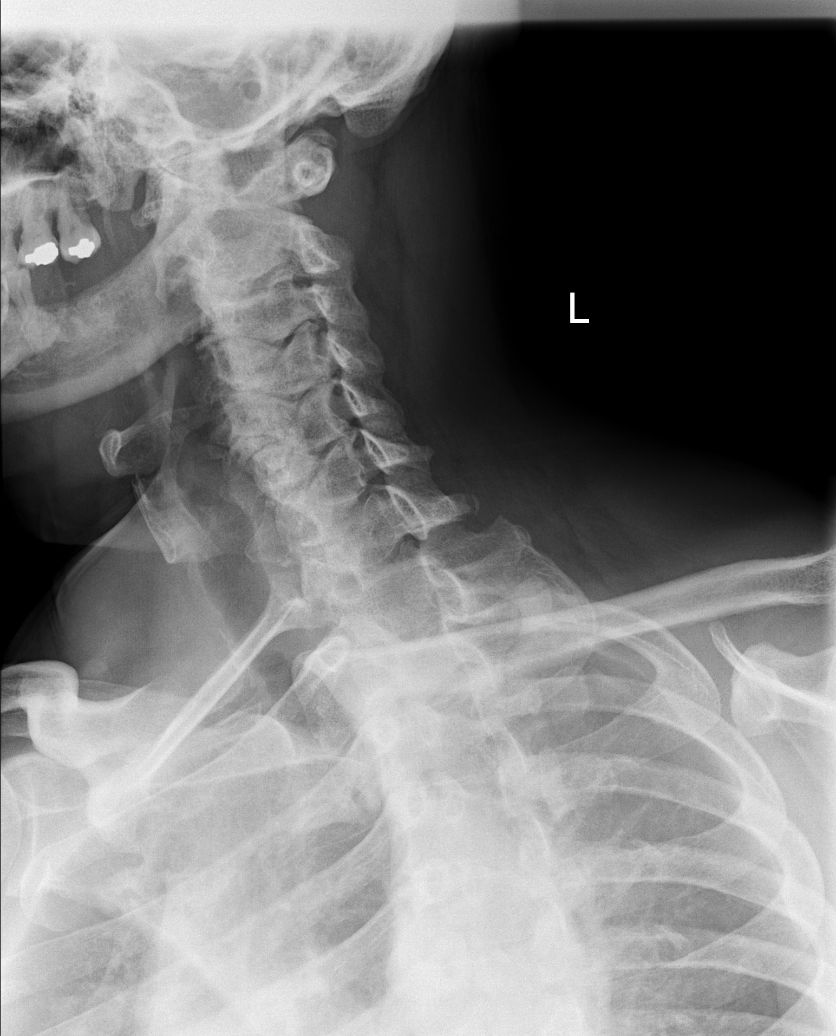

[w cervical spine odontoid]
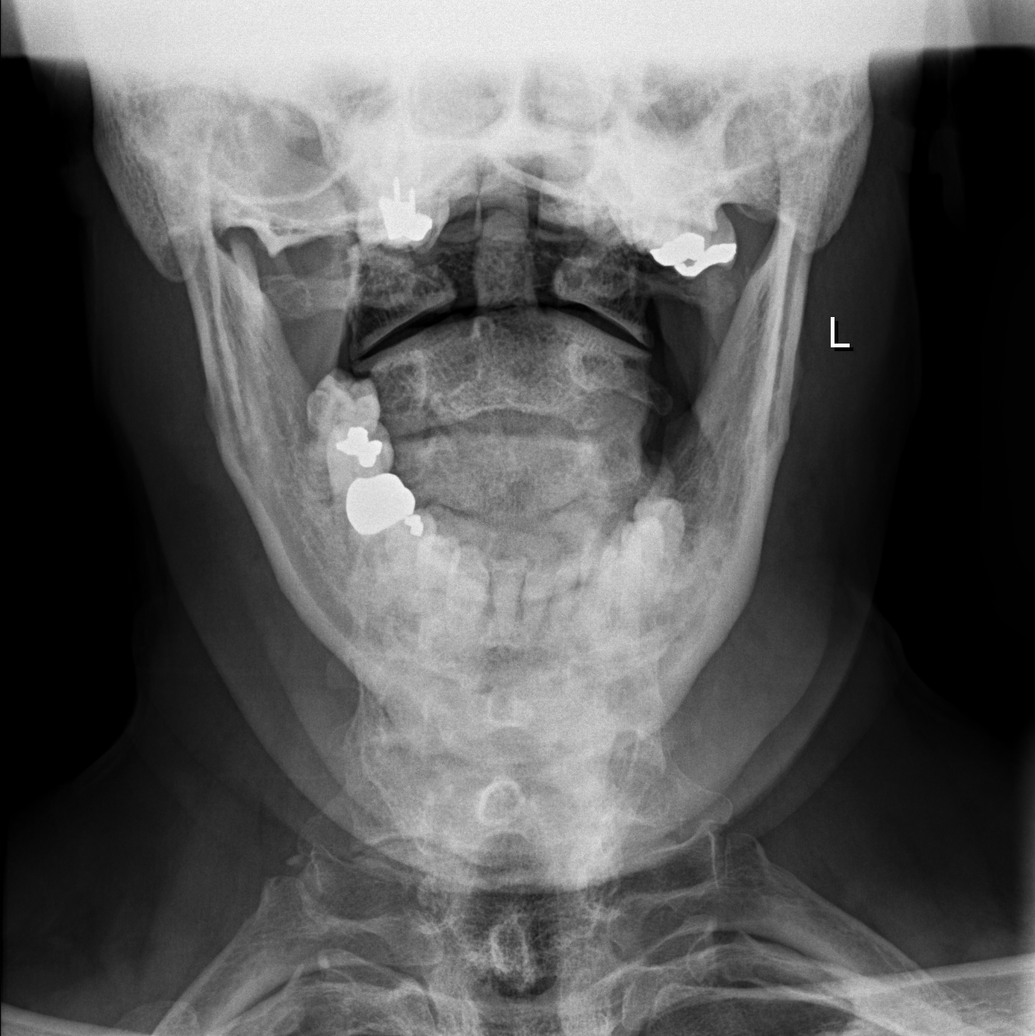

[w cervical spine ap]
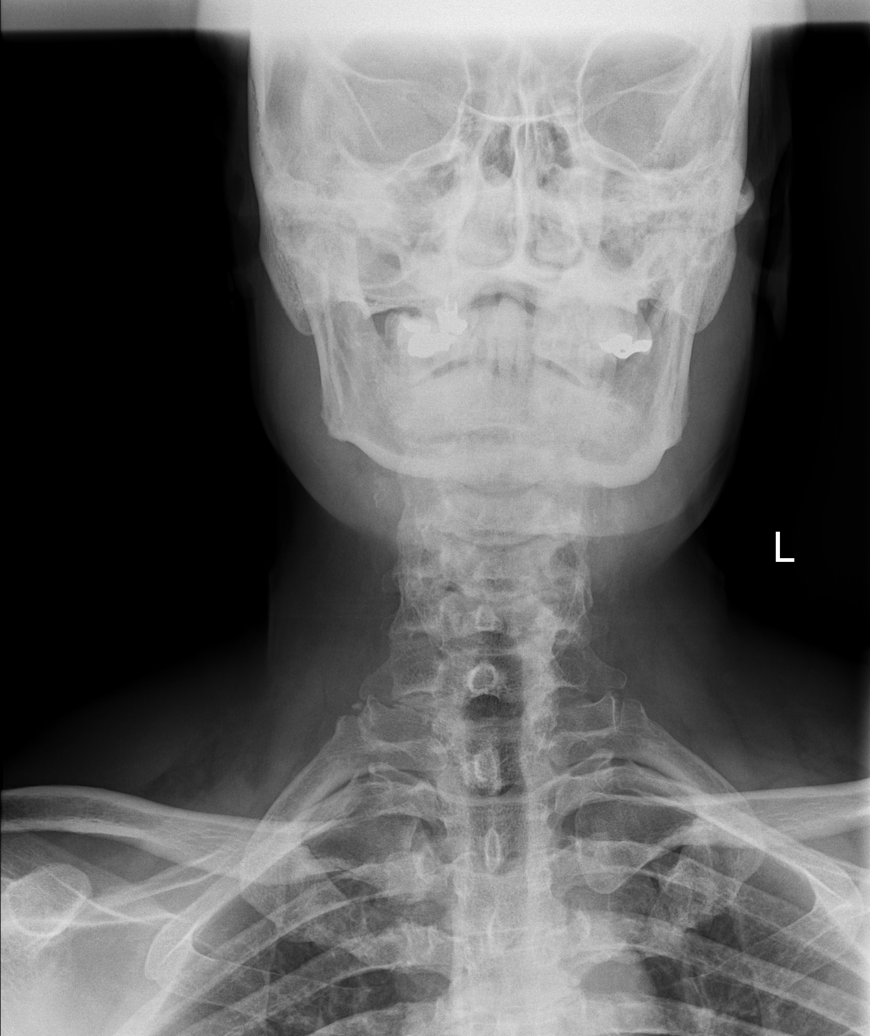

[w cervical swimmers]
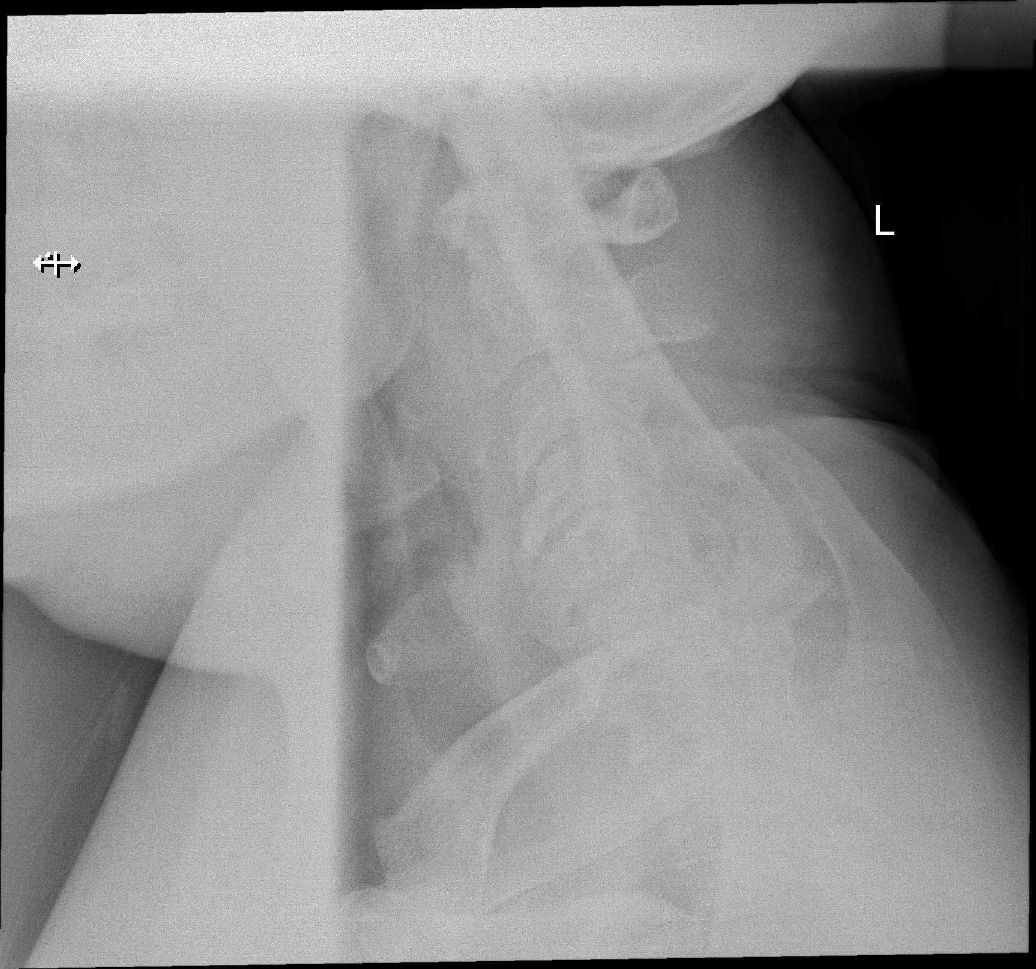

[6 of 6 positions shown; findings below may reference images not displayed]

FINDINGS: Normal alignment.  No fracture or mass.

Disc degeneration and anterior spurring C3 through C7. Mild
posterior spurring C3-4, C4-5, C5-6

Bilateral foraminal encroachment C3 through C7 due to uncinate
spurring
IMPRESSION: Cervical spondylosis.  Bilateral foraminal stenosis due to spurring.

## 2024-02-15 ENCOUNTER — Inpatient Hospital Stay (HOSPITAL_COMMUNITY): Admission: RE | Admit: 2024-02-15 | Payer: 59 | Source: Ambulatory Visit

## 2024-02-23 ENCOUNTER — Other Ambulatory Visit (HOSPITAL_COMMUNITY): Payer: Self-pay | Admitting: Physician Assistant

## 2024-02-23 ENCOUNTER — Ambulatory Visit (HOSPITAL_COMMUNITY)
Admission: RE | Admit: 2024-02-23 | Discharge: 2024-02-23 | Disposition: A | Discharge status: Home / Self Care | Source: Ambulatory Visit | Attending: Physician Assistant | Admitting: Physician Assistant

## 2024-02-23 ENCOUNTER — Ambulatory Visit (HOSPITAL_BASED_OUTPATIENT_CLINIC_OR_DEPARTMENT_OTHER)
Admission: RE | Admit: 2024-02-23 | Discharge: 2024-02-23 | Disposition: A | Discharge status: Home / Self Care | Source: Ambulatory Visit | Attending: Physician Assistant | Admitting: Physician Assistant

## 2024-02-23 DIAGNOSIS — E785 Hyperlipidemia, unspecified: Secondary | ICD-10-CM | POA: Diagnosis not present

## 2024-02-23 DIAGNOSIS — I739 Peripheral vascular disease, unspecified: Secondary | ICD-10-CM

## 2024-02-23 LAB — VAS US ABI WITH/WO TBI
Left ABI: 1.06
Right ABI: 1.01

## 2024-02-25 ENCOUNTER — Ambulatory Visit: Payer: Self-pay | Admitting: Physician Assistant

## 2024-03-05 ENCOUNTER — Telehealth: Payer: Self-pay | Admitting: Pharmacist

## 2024-03-05 NOTE — Progress Notes (Unsigned)
 Patient ID: Jeffrey Winters                 DOB: Aug 11, 1965                    MRN: [id number]      HPI: Jeffrey Winters is a 58 y.o. male patient referred to lipid clinic by Glendia Ferrier, PA-C. PMH is significant for CAD, hx of STEMI, hypertension, PAD,CHF, OSA,COPD, GERD,tobacco abuse, HLD, alcohol abuse    Patient presented today for lipid clinic Patient presented today reports he tolerates Lipitor  80 mg well without side effects. His diet needs improvement. He use to do exercise but lately he is not doing it.he use to smoke 2 pack per day now he smokes only 1/2 pack per day. Encourage to cut down on number of cigarettes.   Reviewed options for lowering LDL cholesterol, including ezetimibe, PCSK-9 inhibitors, bempedoic acid and inclisiran.  Discussed mechanisms of action, dosing, side effects and potential decreases in LDL cholesterol.  Also reviewed cost information and potential options for patient assistance.  Current Medications: Lipitor  80 mg daily  Intolerances: none  Risk Factors: premature CAD, hx of STEMI, hypertension, PAD,CHF, OSA LDL goal: <55 mg/dl  Last lipid lab - direct LDL 86 mg/dl Oe(j)796.5  Diet: eats lots of salads eats some processed and fried food need to improve on that.  Drink: water and 4 coffee with creamers   Exercise: none   Family History:  Relation Problem Comments  Mother Heart disease - hypertension      Father Heart disease - MI      Social History:  Alcohol: quit drinking 4 years back  Smoking: 1/2 pack per day  Labs:  Lipid Panel     Component Value Date/Time   CHOL 147 09/20/2023 1337   TRIG 93 09/20/2023 1337   HDL 53 09/20/2023 1337   CHOLHDL 2.8 09/20/2023 1337   CHOLHDL 3.5 12/10/2021 0322   VLDL 23 12/10/2021 0322   LDLCALC 77 09/20/2023 1337   LDLDIRECT 86 01/25/2024 0953   LABVLDL 17 09/20/2023 1337    Past Medical History:  Diagnosis Date   COPD (chronic obstructive pulmonary disease) (HCC)    Coronary artery disease 2017    MI s/p PCI to OM2 // s/p MI 07/2019 >> DES to RCA; residual RPL2 80 // Myoview  08/2019: EF 61, small area of mod ischemia inf base; Low Risk // Cath 5/23: RCA and OM1 stents ok, mod dz LAD neg RFR, RPLA 80 - med Rx unless refractory angina   Hyperlipidemia LDL goal <70 07/12/2019   Hyperlipidemia   Hypertension    MI (myocardial infarction) (HCC)    PAD (peripheral artery disease) (HCC)    LE Art US  08/2019: R SFA 50-74; L SFA 50-74 // s/p R SFA PTA in 4/21; s/p L SFA PTA in 3/21 (Dr. Court)   Tobacco use     Current Outpatient Medications on File Prior to Visit  Medication Sig Dispense Refill   albuterol  (VENTOLIN  HFA) 108 (90 Base) MCG/ACT inhaler INHALE 1-2 PUFFS INTO THE LUNGS EVERY 6 (SIX) HOURS AS NEEDED FOR WHEEZING OR SHORTNESS OF BREATH. 8 g 2   amLODipine  (NORVASC ) 2.5 MG tablet TAKE 1 TABLET BY MOUTH EVERY DAY 90 tablet 3   ammonium lactate  (AMLACTIN) 12 % lotion Apply 1 Application topically as needed for dry skin. 400 g 0   ANORO ELLIPTA  62.5-25 MCG/ACT AEPB INHALE 1 PUFF INTO THE LUNGS DAILY AT 6 (SIX) AM.  60 each 2   aspirin  EC 81 MG tablet Take 1 tablet orally daily 90 tablet 1   atorvastatin  (LIPITOR ) 80 MG tablet TAKE 1 TABLET BY MOUTH EVERY DAY 90 tablet 3   buPROPion  (WELLBUTRIN  XL) 150 MG 24 hr tablet TAKE 1 TABLET BY MOUTH EVERYDAY AT BEDTIME (Patient taking differently: Take 150 mg by mouth as needed (for anxiety).) 90 tablet 1   clopidogrel  (PLAVIX ) 75 MG tablet TAKE 1 TABLET BY MOUTH EVERY DAY 90 tablet 3   diclofenac  Sodium (VOLTAREN ) 1 % GEL Apply 4 g topically 4 (four) times daily. 100 g 0   escitalopram  (LEXAPRO ) 10 MG tablet TAKE 1 TABLET BY MOUTH EVERY DAY 90 tablet 1   famotidine  (PEPCID ) 20 MG tablet Take 1 tablet by mouth 2 times daily 60 tablet 1   isosorbide  mononitrate (IMDUR ) 60 MG 24 hr tablet TAKE 1.5 TABLETS (90 MG TOTAL) BY MOUTH DAILY. 135 tablet 3   metoprolol  succinate (TOPROL -XL) 25 MG 24 hr tablet TAKE 1 TABLET (25 MG TOTAL) BY MOUTH DAILY. 90  tablet 3   morphine  (MSIR) 15 MG tablet Take 0.5 tablets (7.5 mg total) by mouth every 4 (four) hours as needed. 5 tablet 0   nitroGLYCERIN  (NITROSTAT ) 0.4 MG SL tablet Take 1 tablet sublingually every 5 minutes as needed for chest pain (Patient taking differently: Place 0.4 mg under the tongue every 5 (five) minutes as needed for chest pain.) 25 tablet 1   predniSONE  (DELTASONE ) 10 MG tablet Taper 6,6,5,5,4,4,3,3,2,2,1,1 30 tablet 0   tiZANidine  (ZANAFLEX ) 4 MG tablet Take 1 tablet (4 mg total) by mouth every 6 (six) hours as needed for muscle spasms. 30 tablet 0   No current facility-administered medications on file prior to visit.    No Known Allergies  Assessment/Plan:  1. Hyperlipidemia -  Problem  Hyperlipidemia Ldl Goal <55   Hyperlipidemia Current Medications: Lipitor  80 mg daily  Intolerances: none  Risk Factors: premature CAD, hx of STEMI, hypertension, PAD,CHF, OSA LDL goal: <55 mg/dl  Last lipid lab - direct LDL 86 mg/dl Oe(j)796.5    Hyperlipidemia LDL goal <55 Assessment:  LDL goal: < 55 mg/dl last LDLc 86 mg/dl (93/7975)  Tolerates high intensity statins well without any side effects  Discussed next potential options (Zetia, PCSK-9 inhibitors, bempedoic acid and inclisiran); cost, dosing efficacy, side effects  Diet needs improvement and need to restart moderate intensityexercise at least 150  per week    Plan: Continue taking current medications (Lipitor  80 mg daily ) Start taking Repatha  140 mg under the skin every 14 days  Lipid lab due Oct 28,2025     Thank you,  Robbi Blanch, Pharm.D [facility]. [facility] [address], [place], [place] [address] Phone: [phone]; Fax: [phone]

## 2024-03-06 ENCOUNTER — Ambulatory Visit: Attending: Cardiovascular Disease | Admitting: Pharmacist

## 2024-03-06 ENCOUNTER — Encounter: Payer: Self-pay | Admitting: Pharmacist

## 2024-03-06 ENCOUNTER — Telehealth: Payer: Self-pay

## 2024-03-06 ENCOUNTER — Other Ambulatory Visit (HOSPITAL_COMMUNITY): Payer: Self-pay

## 2024-03-06 DIAGNOSIS — E785 Hyperlipidemia, unspecified: Secondary | ICD-10-CM | POA: Diagnosis not present

## 2024-03-06 MED ORDER — REPATHA SURECLICK 140 MG/ML ~~LOC~~ SOAJ
140.0000 mg | SUBCUTANEOUS | 3 refills | Status: AC
  Filled 2024-03-06: qty 6, 84d supply, fill #0

## 2024-03-06 NOTE — Patient Instructions (Signed)
 Your Results:             Your most recent labs Goal  LDL (lousy/bad cholesterol 86 < 55   Medication changes: continue taking atorvastatin  80 mg daily and start taking Repatha  140 mg under the skin every 14 days      Repatha  is a cholesterol medication that improved your body's ability to get rid of bad cholesterol known as LDL. It can lower your LDL up to 60%! It is an injection that is given under the skin every 2 weeks. The medication often requires a prior authorization from your insurance company. We will take care of submitting all the necessary information to your insurance company to get it approved. The most common side effects of Repatha  include runny nose, symptoms of the common cold, rarely flu or flu-like symptoms, back/muscle pain in about 3-4% of the patients, and redness, pain, or bruising at the injection site.   Lab orders: We want to repeat labs on Oct 28,2025

## 2024-03-06 NOTE — Telephone Encounter (Signed)
 Pharmacy Patient Advocate Encounter   Received notification from Physician's Office that prior authorization for REPATHA  is required/requested.   Insurance verification completed.   The patient is insured through [facility] .   Per test claim: PA required; PA submitted to above mentioned insurance via CoverMyMeds Key/confirmation #/[id number] [facility] Status is pending

## 2024-03-06 NOTE — Assessment & Plan Note (Signed)
 Assessment:  LDL goal: < 55 mg/dl last LDLc 86 mg/dl (93/7975)  Tolerates high intensity statins well without any side effects  Discussed next potential options (Zetia, PCSK-9 inhibitors, bempedoic acid and inclisiran); cost, dosing efficacy, side effects  Diet needs improvement and need to restart moderate intensityexercise at least 150  per week    Plan: Continue taking current medications (Lipitor  80 mg daily ) Start taking Repatha  140 mg under the skin every 14 days  Lipid lab due Oct 28,2025

## 2024-03-06 NOTE — Telephone Encounter (Signed)
 Pharmacy Patient Advocate Encounter  Received notification from [employer] that Prior Authorization for REPATHA  has been APPROVED from 03/06/24 to 09/06/24. Unable to obtain price due to refill too soon rejection, last fill date 03/06/24 next available fill date 05/08/24

## 2024-03-06 NOTE — Telephone Encounter (Signed)
 PA request has been Submitted. New Encounter has been or will be created for follow up. For additional info see Pharmacy Prior Auth telephone encounter from 03/06/24.

## 2024-03-07 ENCOUNTER — Ambulatory Visit: Payer: Self-pay | Admitting: Pharmacist

## 2024-03-07 LAB — LIPID PANEL
Chol/HDL Ratio: 3.7 ratio (ref 0.0–5.0)
Cholesterol, Total: 139 mg/dL (ref 100–199)
HDL: 38 mg/dL — ABNORMAL LOW (ref >39)
LDL Chol Calc (NIH): 78 mg/dL (ref 0–99)
Triglycerides: 128 mg/dL (ref 0–149)
VLDL Cholesterol Cal: 23 mg/dL (ref 5–40)

## 2024-03-07 NOTE — Telephone Encounter (Signed)
 Repatha  got added to high intensity statin. Pt will be starting from Aug 2025 follow up lab due in 3 months

## 2024-03-13 ENCOUNTER — Ambulatory Visit: Admitting: Family Medicine

## 2024-03-13 ENCOUNTER — Encounter: Payer: Self-pay | Admitting: Family Medicine

## 2024-03-13 VITALS — BP 98/64 | HR 73 | Ht 67.0 in | Wt 234.4 lb

## 2024-03-13 DIAGNOSIS — Z720 Tobacco use: Secondary | ICD-10-CM

## 2024-03-13 DIAGNOSIS — J449 Chronic obstructive pulmonary disease, unspecified: Secondary | ICD-10-CM | POA: Diagnosis not present

## 2024-03-13 DIAGNOSIS — M79641 Pain in right hand: Secondary | ICD-10-CM

## 2024-03-13 DIAGNOSIS — Z7689 Persons encountering health services in other specified circumstances: Secondary | ICD-10-CM | POA: Diagnosis not present

## 2024-03-13 MED ORDER — ALBUTEROL SULFATE HFA 108 (90 BASE) MCG/ACT IN AERS: 1.0000 | INHALATION_SPRAY | Freq: Four times a day (QID) | RESPIRATORY_TRACT | 2 refills | Status: AC | PRN

## 2024-03-13 MED ORDER — UMECLIDINIUM-VILANTEROL 62.5-25 MCG/ACT IN AEPB: INHALATION_SPRAY | RESPIRATORY_TRACT | 11 refills | Status: AC

## 2024-03-13 NOTE — Progress Notes (Signed)
 New Patient Office Visit  Subjective   Patient ID: Jeffrey Winters, male    DOB: July 27, 1966  Age: 58 y.o. MRN: [id number]  CC: No chief complaint on file.   HPI Jeffrey Winters presents to establish care  Subjective - New onset right hand pain and swelling for two weeks, described as a needle-like pain. Difficulty carrying objects. - Requests refills for Anoro and albuterol  inhalers.  Medications: Amlodipine , Anoro inhaler, aspirin  80mg , Lipitor , clopidogrel , Voltaren  gel PRN, Lexapro  10mg , Repatha  (started 03/09/2024), famotidine , propranolol, metoprolol , and nitroglycerin  PRN. Reports prior use of morphine  and prednisone  for back pain. Takes tizanidine  PRN for back pain.  PMHx: Coronary artery disease, COPD, hypercholesterolemia, history of two myocardial infarctions, low back pain. PSHx: Four stent placements (two coronary, one right leg, one left leg). FHx: Brother died of throat cancer at age 87-78. Sister with possible gout. Social Hx: Single. On disability, former Financial controller. Quit alcohol 4 years ago. Current smoker, 0.5 packs per day (reduced from 1 pack per day). Educated on Chantix for smoking cessation. No recent sexual activity.  Review of Systems Pertinent Positives: Shortness of breath when not using inhaler, right hand pain and swelling. Pertinent Negatives: Denies fever, chills, chest pain, eye pain, vision changes, hearing changes, sore throat, urinary symptoms, skin lesions, rashes, weakness, numbness, headache, dizziness, anxiety, depression.  Objective MSK: Swelling and tenderness to palpation of the right hand. No significant erythema or color changes. PULM: Lungs clear to auscultation bilaterally. Labs: Recent normal prostate cancer screening, kidney, and liver function tests. Cholesterol check pending re-evaluation. Last A1C was a while ago.  Assessment and Plan Right Hand Pain/Swelling Right hand pain and swelling for two weeks, suspicious for gout  vs. osteoarthritis vs. tenosynovitis. Physical exam shows swelling and tenderness without erythema. - Plan: Lab work for uric acid, CBC, and CMP. - Advised to monitor for signs of infection (increased redness, swelling, color changes). - Will message patient with a prescription for gout medication if uric acid is elevated.  Medication Refill Request Patient requires refills for maintenance and rescue inhalers for COPD. - Plan: Anoro Ellipta  and albuterol  inhaler [facility] [address]  Smoking Cessation Tobacco use disorder, currently smoking 0.5 packs per day. - Plan: Discussed smoking cessation options, including risks and benefits of Chantix.  Health Maintenance Due for routine lab monitoring. - Plan: Lab work to include A1C and CMP. - Follow-up in 6 months. - Patient will be contacted sooner if lab results are concerning. Advised to schedule lab appointment.  Toribio [name], MD   Outpatient Encounter Medications as of 03/13/2024  Medication Sig   albuterol  (VENTOLIN  HFA) 108 (90 Base) MCG/ACT inhaler INHALE 1-2 PUFFS INTO THE LUNGS EVERY 6 (SIX) HOURS AS NEEDED FOR WHEEZING OR SHORTNESS OF BREATH.   amLODipine  (NORVASC ) 2.5 MG tablet TAKE 1 TABLET BY MOUTH EVERY DAY   ammonium lactate  (AMLACTIN) 12 % lotion Apply 1 Application topically as needed for dry skin.   ANORO ELLIPTA  62.5-25 MCG/ACT AEPB INHALE 1 PUFF INTO THE LUNGS DAILY AT 6 (SIX) AM.   aspirin  EC 81 MG tablet Take 1 tablet orally daily   atorvastatin  (LIPITOR ) 80 MG tablet TAKE 1 TABLET BY MOUTH EVERY DAY   buPROPion  (WELLBUTRIN  XL) 150 MG 24 hr tablet TAKE 1 TABLET BY MOUTH EVERYDAY AT BEDTIME (Patient taking differently: Take 150 mg by mouth as needed (for anxiety).)   clopidogrel  (PLAVIX ) 75 MG tablet TAKE 1 TABLET BY MOUTH EVERY DAY   diclofenac  Sodium (VOLTAREN )  LUNGS EVERY 6 (SIX) HOURS AS NEEDED FOR WHEEZING OR SHORTNESS OF BREATH.   [DISCONTINUED] ANORO ELLIPTA  62.5-25 MCG/ACT AEPB INHALE 1 PUFF INTO THE LUNGS DAILY AT 6 (SIX) AM.   No facility-administered encounter medications on file as of 03/13/2024.    Past Medical History:  Diagnosis Date   COPD (chronic obstructive pulmonary disease) (HCC)     Coronary artery disease 2017   MI s/p PCI to OM2 // s/p MI 07/2019 >> DES to RCA; residual RPL2 80 // Myoview  08/2019: EF 61, small area of mod ischemia inf base; Low Risk // Cath 5/23: RCA and OM1 stents ok, mod dz LAD neg RFR, RPLA 80 - med Rx unless refractory angina   Hyperlipidemia LDL goal <70 07/12/2019   Hyperlipidemia   Hypertension    MI (myocardial infarction) (HCC)    PAD (peripheral artery disease) (HCC)    LE Art US  08/2019: R SFA 50-74; L SFA 50-74 // s/p R SFA PTA in 4/21; s/p L SFA PTA in 3/21 (Dr. Court)   Tobacco use     Past Surgical History:  Procedure Laterality Date   ABDOMINAL AORTOGRAM W/LOWER EXTREMITY Bilateral 10/05/2019   Procedure: ABDOMINAL AORTOGRAM W/LOWER EXTREMITY;  Surgeon: Court Dorn PARAS, MD;  Location: [facility];  Service: Cardiovascular;  Laterality: Bilateral;   ABDOMINAL AORTOGRAM W/LOWER EXTREMITY N/A 11/09/2019   Procedure: ABDOMINAL AORTOGRAM W/LOWER EXTREMITY;  Surgeon: Court Dorn PARAS, MD;  Location: [facility];  Service: Cardiovascular;  Laterality: N/A;   CORONARY PRESSURE/FFR STUDY N/A 12/10/2021   Procedure: INTRAVASCULAR PRESSURE WIRE/FFR STUDY;  Surgeon: Wonda Sharper, MD;  Location: [facility] INVASIVE CV LAB;  Service: Cardiovascular;  Laterality: N/A;   CORONARY/GRAFT ACUTE MI REVASCULARIZATION N/A 07/10/2019   Procedure: CORONARY/GRAFT ACUTE MI REVASCULARIZATION;  Surgeon: Mady Bruckner, MD;  Location: [facility];  Service: Cardiovascular;  Laterality: N/A;   LEFT HEART CATH AND CORONARY ANGIOGRAPHY N/A 07/10/2019   Procedure: LEFT HEART CATH AND CORONARY ANGIOGRAPHY;  Surgeon: Mady Bruckner, MD;  [facility]: [facility];  Service: Cardiovascular;  Laterality: N/A;   LEFT HEART CATH AND CORONARY ANGIOGRAPHY N/A 12/10/2021   Procedure: LEFT HEART CATH AND CORONARY ANGIOGRAPHY;  Surgeon: Wonda Sharper, MD;  Location: [facility] INVASIVE CV LAB;  Service: Cardiovascular;  Laterality: N/A;   PERCUTANEOUS CORONARY STENT  INTERVENTION (PCI-S)  2017   DES to OM2 at [facility]   PERIPHERAL INTRAVASCULAR LITHOTRIPSY  11/09/2019   Procedure: INTRAVASCULAR LITHOTRIPSY;  Surgeon: Court Dorn PARAS, MD;  Location: [facility] INVASIVE CV LAB;  Service: Cardiovascular;;   PERIPHERAL VASCULAR ATHERECTOMY Left 10/05/2019   Procedure: PERIPHERAL VASCULAR ATHERECTOMY;  Surgeon: Court Dorn PARAS, MD;  Location: [facility] INVASIVE CV LAB;  Service: Cardiovascular;  Laterality: Left;  SFA   PERIPHERAL VASCULAR BALLOON ANGIOPLASTY  11/09/2019   Procedure: PERIPHERAL VASCULAR BALLOON ANGIOPLASTY;  Surgeon: Court Dorn PARAS, MD;  [facility];  Service: Cardiovascular;;   PERIPHERAL VASCULAR INTERVENTION Left 10/05/2019   Procedure: PERIPHERAL VASCULAR INTERVENTION;  Surgeon: Court Dorn PARAS, MD;  Location: [facility];  Service: Cardiovascular;  Laterality: Left;  SFA    Family History  Problem Relation Age of Onset   Heart disease Mother    Heart disease Father     Social History   Socioeconomic History   Marital status: Single    Spouse name: Not on file   Number of children: Not on file   Years of education: Not on file   Highest education level: Not  Procedure: PERIPHERAL VASCULAR ATHERECTOMY;  Surgeon: Court Dorn PARAS, MD;  Location: [facility] INVASIVE CV LAB;  Service: Cardiovascular;  Laterality: Left;  SFA   PERIPHERAL VASCULAR BALLOON ANGIOPLASTY  11/09/2019   Procedure: PERIPHERAL VASCULAR BALLOON ANGIOPLASTY;  Surgeon: Court Dorn PARAS, MD;  [facility];  Service: Cardiovascular;;   PERIPHERAL VASCULAR INTERVENTION Left 10/05/2019   Procedure: PERIPHERAL VASCULAR INTERVENTION;  Surgeon: Court Dorn PARAS, MD;  [facility]: [facility];  Service: Cardiovascular;  Laterality: Left;  SFA    Family History  Problem Relation  Age of Onset   Heart disease Mother    Heart disease Father     Social History   Socioeconomic History   Marital status: Single    Spouse name: Not on file   Number of children: Not on file   Years of education: Not on file   Highest education level: Not on file  Occupational History   Not on file  Tobacco Use   Smoking status: Every Day    Current packs/day: 1.00    Average packs/day: 1 pack/day for 7.0 years (7.0 ttl pk-yrs)    Types: Cigarettes   Smokeless tobacco: Never   Tobacco comments:    smokes 1/2 pack per day 06/19/2020  Vaping Use   Vaping status: Never Used  Substance and Sexual Activity   Alcohol use: Not Currently   Drug use: No   Sexual activity: Not Currently  Other Topics Concern   Not on file  Social History Narrative   Not on file   Social Drivers of Health   Financial Resource Strain: Not on file  Food Insecurity: No Food Insecurity (12/15/2023)   Hunger Vital Sign    Worried About Running Out of Food in the Last Year: Never true    Ran Out of Food in the Last Year: Never true  Transportation Needs: No Transportation Needs (12/15/2023)   PRAPARE - Administrator, Civil Service (Medical): No    Lack of Transportation (Non-Medical): No  Physical Activity: Not on file  Stress: Not on file  Social Connections: Not on file  Intimate Partner Violence: Not on file    ROS     Objective   There were no vitals taken for this visit.  Physical Exam     Assessment & Plan:   There are no diagnoses linked to this encounter.  No follow-ups on file.   Toribio MARLA [name], MD

## 2024-03-13 NOTE — Patient Instructions (Signed)
 It was nice to see you today,  We addressed the following topics today: -I have sent in your inhalers to the pharmacy - Once I get the results of your tests I will let you know if we need to treat for gout or something else.  Have a great day,  Rolan [name], MD

## 2024-03-15 ENCOUNTER — Other Ambulatory Visit (HOSPITAL_COMMUNITY): Payer: Self-pay

## 2024-03-15 DIAGNOSIS — M79641 Pain in right hand: Secondary | ICD-10-CM | POA: Insufficient documentation

## 2024-03-15 NOTE — Assessment & Plan Note (Signed)
 currently smoking 0.5 packs per day. - Plan: Discussed smoking cessation options, including risks and benefits of Chantix.

## 2024-03-15 NOTE — Assessment & Plan Note (Signed)
 Patient requires refills for maintenance and rescue inhalers for COPD. - Plan: Anoro Ellipta  and albuterol  inhal[facility] [address].

## 2024-03-15 NOTE — Assessment & Plan Note (Signed)
 Right hand pain and swelling for two weeks, suspicious for gout vs. osteoarthritis vs. tenosynovitis. Physical exam shows swelling and tenderness without erythema. - Plan: Lab work for uric acid, CBC, and CMP. - Advised to monitor for signs of infection (increased redness, swelling, color changes). - Will message patient with a prescription for gout medication if uric acid is elevated.

## 2024-03-16 ENCOUNTER — Ambulatory Visit: Payer: Self-pay | Admitting: Nurse Practitioner

## 2024-03-28 ENCOUNTER — Other Ambulatory Visit: Payer: Self-pay | Admitting: *Deleted

## 2024-03-28 ENCOUNTER — Other Ambulatory Visit

## 2024-03-28 DIAGNOSIS — M2559 Pain in other specified joint: Secondary | ICD-10-CM

## 2024-03-28 DIAGNOSIS — I10 Essential (primary) hypertension: Secondary | ICD-10-CM

## 2024-03-28 DIAGNOSIS — E785 Hyperlipidemia, unspecified: Secondary | ICD-10-CM | POA: Diagnosis not present

## 2024-03-28 DIAGNOSIS — Z131 Encounter for screening for diabetes mellitus: Secondary | ICD-10-CM | POA: Diagnosis not present

## 2024-03-28 DIAGNOSIS — G8929 Other chronic pain: Secondary | ICD-10-CM

## 2024-03-29 ENCOUNTER — Ambulatory Visit: Payer: Self-pay | Admitting: Family Medicine

## 2024-03-29 ENCOUNTER — Other Ambulatory Visit: Payer: Self-pay | Admitting: Family Medicine

## 2024-03-29 LAB — COMPREHENSIVE METABOLIC PANEL WITH GFR
ALT: 30 IU/L (ref 0–44)
AST: 25 IU/L (ref 0–40)
Albumin: 4.2 g/dL (ref 3.8–4.9)
Alkaline Phosphatase: 140 IU/L — ABNORMAL HIGH (ref 44–121)
BUN/Creatinine Ratio: 13 (ref 9–20)
BUN: 13 mg/dL (ref 6–24)
Bilirubin Total: 0.4 mg/dL (ref 0.0–1.2)
CO2: 24 mmol/L (ref 20–29)
Calcium: 8.9 mg/dL (ref 8.7–10.2)
Chloride: 101 mmol/L (ref 96–106)
Creatinine, Ser: 0.97 mg/dL (ref 0.76–1.27)
Globulin, Total: 2.4 g/dL (ref 1.5–4.5)
Glucose: 128 mg/dL — ABNORMAL HIGH (ref 70–99)
Potassium: 4.1 mmol/L (ref 3.5–5.2)
Sodium: 138 mmol/L (ref 134–144)
Total Protein: 6.6 g/dL (ref 6.0–8.5)
eGFR: 90 mL/min/1.73 (ref >59)

## 2024-03-29 LAB — URIC ACID: Uric Acid: 6 mg/dL (ref 3.8–8.4)

## 2024-03-29 LAB — HEMOGLOBIN A1C
Est. average glucose Bld gHb Est-mCnc: 126 mg/dL
Hgb A1c MFr Bld: 6 % — ABNORMAL HIGH (ref 4.8–5.6)

## 2024-03-29 MED ORDER — PREDNISONE 10 MG PO TABS
ORAL_TABLET | ORAL | 0 refills | Status: AC
Start: 2024-03-29 — End: 2024-04-06

## 2024-04-05 ENCOUNTER — Other Ambulatory Visit: Payer: Self-pay | Admitting: Physician Assistant

## 2024-04-05 DIAGNOSIS — I2510 Atherosclerotic heart disease of native coronary artery without angina pectoris: Secondary | ICD-10-CM

## 2024-04-05 DIAGNOSIS — E782 Mixed hyperlipidemia: Secondary | ICD-10-CM

## 2024-04-05 DIAGNOSIS — I10 Essential (primary) hypertension: Secondary | ICD-10-CM

## 2024-04-17 ENCOUNTER — Other Ambulatory Visit (INDEPENDENT_AMBULATORY_CARE_PROVIDER_SITE_OTHER)

## 2024-04-17 ENCOUNTER — Ambulatory Visit (INDEPENDENT_AMBULATORY_CARE_PROVIDER_SITE_OTHER): Admitting: Physician Assistant

## 2024-04-17 DIAGNOSIS — M25561 Pain in right knee: Secondary | ICD-10-CM

## 2024-04-17 MED ORDER — LIDOCAINE HCL 1 % IJ SOLN
5.0000 mL | INTRAMUSCULAR | Status: AC | PRN
  Administered 2024-04-17: 5 mL

## 2024-04-17 MED ORDER — METHYLPREDNISOLONE ACETATE 40 MG/ML IJ SUSP
40.0000 mg | INTRAMUSCULAR | Status: AC | PRN
  Administered 2024-04-17: 40 mg via INTRA_ARTICULAR

## 2024-04-17 NOTE — Progress Notes (Signed)
 Office Visit Note   Patient: Jeffrey Winters           Date of Birth: 09/12/1965           MRN: [id number] Visit Date: 04/17/2024              Requested by: Chandra Toribio POUR, MD [address] [place],  [place] [address] PCP: Chandra Toribio POUR, MD  HPI: Mr. Rubi comes in today for chronic right knee pain.  This first time we are seeing him but he has been seen by [facility] here in our office in the past for his neck and knees.  He states that he has had right knee pain for the last month.  No known injury.  Knee feels like it gives way at times.  No chemical symptoms otherwise he is nondiabetic.  Physical exam:  General: Well-developed well-nourished male who ambulates without any assistive device. Psych: Alert and oriented x 3 Respirations: Unlabored Bilateral knees good range of motion.  No abnormal warmth erythema.  Slight effusion right knee only.  Patellofemoral crepitus bilateral knees.  No gross instability of either knee.  Radiographs: Right knee AP and lateral compared to films on 03/09/2023.  No significant changes.  Right knee with mild to moderate narrowing lateral joint line.  Medial joint line overall well-preserved.  Moderate patellofemoral changes with articular osteophytes.  No bony abnormalities otherwise knee is well located. Assessment & Plan: Visit Diagnoses:  1. Acute pain of right knee     Plan:  He will follow-up with us  as needed knows to wait at least 3 months between aspiration and injections.  Knee friendly exercises were discussed with him.  Questions were encouraged and answered.  Follow-Up Instructions: Return if symptoms worsen or fail to improve.   Orders:  Orders Placed This Encounter  Procedures   Large Joint Inj: R knee   XR Knee 1-2 Views Right   No orders of the defined types were placed in this encounter.     Procedures: Large Joint Inj: R knee on 04/17/2024 9:08 AM Indications: pain Details: 22 G 1.5 in needle, superolateral  approach  Arthrogram: No  Medications: 5 mL lidocaine  1 %; 40 mg methylPREDNISolone  acetate 40 MG/ML Aspirate: 24 mL yellow Outcome: tolerated well, no immediate complications Procedure, treatment alternatives, risks and benefits explained, specific risks discussed. Consent was given by the patient. Immediately prior to procedure a time out was called to verify the correct patient, procedure, equipment, support staff and site/side marked as required. Patient was prepped and draped in the usual sterile fashion.       Clinical Data: No additional findings.   Subjective: Chief Complaint  Patient presents with   Right Knee - Pain    HPI  Review of Systems   Objective: Vital Signs: There were no vitals taken for this visit.  Physical Exam  Ortho Exam  Specialty Comments:  No specialty comments available.  Imaging: XR Knee 1-2 Views Right Result Date: 04/17/2024 Right knee AP and lateral compared to films on 03/09/2023.  No significant changes.  Right knee with mild to moderate narrowing lateral joint line.  Medial joint line overall well-preserved.  Moderate patellofemoral changes with articular osteophytes.  No bony abnormalities otherwise knee is well located.    PMFS History: Patient Active Problem List   Diagnosis Date Noted   Right hand pain 03/15/2024   Dry skin 08/13/2022   Dental abscess 03/11/2022   Radiculopathy, cervical region 01/13/2022   Cervical  disc disorder with radiculopathy, unspecified cervical region 01/13/2022   Chronic right shoulder pain 01/08/2022   Right arm pain 12/31/2021   Health care maintenance 12/26/2021   CAD (coronary artery disease) 07/08/2021   Anxiety 03/13/2021   CHF (congestive heart failure) (HCC) 03/13/2021   Depression 03/13/2021   GERD (gastroesophageal reflux disease) 03/13/2021   Obesity 03/13/2021   Tobacco dependence 03/13/2021   Facial cellulitis 11/25/2020   Essential hypertension 11/25/2020   COPD (chronic  obstructive pulmonary disease) (HCC) 11/25/2020   Tobacco abuse 11/20/2020   Influenza vaccine refused 05/29/2020   PAD (peripheral artery disease) (HCC)    Hyperlipidemia LDL goal <55 07/12/2019   Old MI (myocardial infarction) 07/10/2019   Obstructive sleep apnea hypopnea, moderate 05/15/2016   Atherosclerotic heart disease 05/14/2016   Alcohol abuse 02/20/2016   Bradycardia 02/20/2016   Past Medical History:  Diagnosis Date   COPD (chronic obstructive pulmonary disease) (HCC)    Coronary artery disease 2017   MI s/p PCI to OM2 // s/p MI 07/2019 >> DES to RCA; residual RPL2 80 // Myoview  08/2019: EF 61, small area of mod ischemia inf base; Low Risk // Cath 5/23: RCA and OM1 stents ok, mod dz LAD neg RFR, RPLA 80 - med Rx unless refractory angina   Hyperlipidemia LDL goal <70 07/12/2019   Hyperlipidemia   Hypertension    MI (myocardial infarction) (HCC)    PAD (peripheral artery disease) (HCC)    LE Art US  08/2019: R SFA 50-74; L SFA 50-74 // s/p R SFA PTA in 4/21; s/p L SFA PTA in 3/21 (Dr. Court)   Tobacco use     Family History  Problem Relation Age of Onset   Heart disease Mother    Heart disease Father     Past Surgical History:  Procedure Laterality Date   ABDOMINAL AORTOGRAM W/LOWER EXTREMITY Bilateral 10/05/2019   Procedure: ABDOMINAL AORTOGRAM W/LOWER EXTREMITY;  Surgeon: Court Dorn PARAS, MD;  Location: [facility];  Service: Cardiovascular;  Laterality: Bilateral;   ABDOMINAL AORTOGRAM W/LOWER EXTREMITY N/A 11/09/2019   Procedure: ABDOMINAL AORTOGRAM W/LOWER EXTREMITY;  Surgeon: Court Dorn PARAS, MD;  Location: [facility];  Service: Cardiovascular;  Laterality: N/A;   CORONARY PRESSURE/FFR STUDY N/A 12/10/2021   Procedure: INTRAVASCULAR PRESSURE WIRE/FFR STUDY;  Surgeon: Wonda Sharper, MD;  Location: [facility] INVASIVE CV LAB;  Service: Cardiovascular;  Laterality: N/A;   CORONARY/GRAFT ACUTE MI REVASCULARIZATION N/A 07/10/2019   Procedure: CORONARY/GRAFT ACUTE  MI REVASCULARIZATION;  Surgeon: Mady Bruckner, MD;  Location: [facility];  Service: Cardiovascular;  Laterality: N/A;   LEFT HEART CATH AND CORONARY ANGIOGRAPHY N/A 07/10/2019   Procedure: LEFT HEART CATH AND CORONARY ANGIOGRAPHY;  Surgeon: Mady Bruckner, MD;  Location: [facility];  Service: Cardiovascular;  Laterality: N/A;   LEFT HEART CATH AND CORONARY ANGIOGRAPHY N/A 12/10/2021   Procedure: LEFT HEART CATH AND CORONARY ANGIOGRAPHY;  Surgeon: Wonda Sharper, MD;  Location: [facility] INVASIVE CV LAB;  Service: Cardiovascular;  Laterality: N/A;   PERCUTANEOUS CORONARY STENT INTERVENTION (PCI-S)  2017   DES to OM2 at [facility]   PERIPHERAL INTRAVASCULAR LITHOTRIPSY  11/09/2019   Procedure: INTRAVASCULAR LITHOTRIPSY;  Surgeon: Court Dorn PARAS, MD;  Location: [facility] INVASIVE CV LAB;  Service: Cardiovascular;;   PERIPHERAL VASCULAR ATHERECTOMY Left 10/05/2019   Procedure: PERIPHERAL VASCULAR ATHERECTOMY;  Surgeon: Court Dorn PARAS, MD;  Location: [facility] INVASIVE CV LAB;  Service: Cardiovascular;  Laterality: Left;  SFA   PERIPHERAL VASCULAR BALLOON ANGIOPLASTY  11/09/2019  Procedure: PERIPHERAL VASCULAR BALLOON ANGIOPLASTY;  Surgeon: Court Dorn PARAS, MD;  Location: [facility];  Service: Cardiovascular;;   PERIPHERAL VASCULAR INTERVENTION Left 10/05/2019   Procedure: PERIPHERAL VASCULAR INTERVENTION;  Surgeon: Court Dorn PARAS, MD;  Location: [facility];  Service: Cardiovascular;  Laterality: Left;  SFA   Social History   Occupational History   Not on file  Tobacco Use   Smoking status: Every Day    Current packs/day: 1.00    Average packs/day: 1 pack/day for 7.0 years (7.0 ttl pk-yrs)    Types: Cigarettes   Smokeless tobacco: Never   Tobacco comments:    smokes 1/2 pack per day 06/19/2020  Vaping Use   Vaping status: Never Used  Substance and Sexual Activity   Alcohol use: Not Currently   Drug use: No   Sexual activity: Not Currently

## 2024-06-08 ENCOUNTER — Telehealth: Payer: Self-pay | Admitting: Pharmacist

## 2024-06-08 NOTE — Telephone Encounter (Signed)
 Called patient back to inform him that [facility], and that he must contact them to get prescription filled.   Layna Roeper PharmD Candidate  [facility]

## 2024-06-08 NOTE — Telephone Encounter (Signed)
 Called patient to discuss use of Repatha , which was initiated in August 2025. Patient reports using medication with no adverse effects or issues. Last dose 05/16/24.   Informed patient of need to collect fasting lipid panel to assess efficacy of Repatha . Discussed how to get lab collected at [facility] and need for collection to be done while fasting. Patient agreeable.   Patient requested ref[facility] of Rep[facility]. Informed him we will send prescription there for pickup.   Malesha Suliman PharmD Candidate  [facility]

## 2024-06-28 ENCOUNTER — Ambulatory Visit: Payer: Self-pay

## 2024-06-28 ENCOUNTER — Telehealth: Payer: Self-pay

## 2024-06-28 NOTE — Telephone Encounter (Signed)
 Patient already spoke to NT. See other note.

## 2024-06-28 NOTE — Telephone Encounter (Signed)
 Nurse Triage Encounter created.    Copied from CRM [id number]. Topic: Clinical - Red Word Triage >> Jun 28, 2024  9:40 AM Jeffrey Winters wrote: Red Word that prompted transfer to Nurse Triage: Right arm is numb, going into his neck and shoulder. Extreme pain.

## 2024-06-28 NOTE — Telephone Encounter (Signed)
 FYI Only or Action Required?: FYI only for provider: appointment scheduled on 12/1.  Patient was last seen in primary care on 03/13/2024 by Chandra Toribio POUR, MD.  Called Nurse Triage reporting Neck Pain.  Symptoms began several weeks ago.  Interventions attempted: Rest, hydration, or home remedies.  Symptoms are: unchanged.  Triage Disposition: See Physician Within 24 Hours  Patient/caregiver understands and will follow disposition?: Yes with modifications, appointment 12/1 and ED/urgent care precautions discussed.      Copied from CRM #[id number]. Topic: Clinical - Red Word Triage >> Jun 28, 2024  9:52 AM Gustabo D wrote: Right arm is numb all the way going on about 3 weeks now he doesn't know if its a pulled muscle its in his neck and back area. Last time this happened he was given a steroid shot he says it helped that was about 6 or 7  months ago.       Reason for Disposition  Numbness in an arm or hand (i.e., loss of sensation)  Answer Assessment - Initial Assessment Questions Symptoms present for 3 weeks and patient would prefer being seen in the office. Appointment scheduled on 12/1. ED and urgent care precautions discussed with the patient.     1. ONSET: When did the pain begin?      3 weeks ago 2. LOCATION: Where does it hurt?      Right sided neck  3. PATTERN Does the pain come and go, or has it been constant since it started?      Constant  4. SEVERITY: How bad is the pain?  (Scale 0-10; or none or slight stiffness, mild, moderate, severe)     Moderate to severe  5. RADIATION: Does the pain go anywhere else, shoot into your arms?     Radiates into right shoulder, right arm, and back  6. CORD SYMPTOMS: Any weakness or numbness of the arms or legs?     Numbness in right arm  7. CAUSE: What do you think is causing the neck pain?     Unsure  8. NECK OVERUSE: Any recent activities that involved turning or twisting the neck?     No 9. OTHER SYMPTOMS:  Do you have any other symptoms? (e.g., headache, fever, chest pain, difficulty breathing, neck swelling)     No  Protocols used: Neck Pain or Stiffness-A-AH

## 2024-06-28 NOTE — Telephone Encounter (Signed)
 This RN made first attempt to contact pt. Pt had disconnected call with [facility]. This RN left a vm with call back number.    Copied from CRM [id number]. Topic: Clinical - Red Word Triage >> Jun 28, 2024  9:40 AM Jeffrey Winters wrote: Red Word that prompted transfer to Nurse Triage: Right arm is numb, going into his neck and shoulder. Extreme pain.

## 2024-07-03 ENCOUNTER — Encounter: Payer: Self-pay | Admitting: Cardiovascular Disease

## 2024-07-03 ENCOUNTER — Ambulatory Visit: Admitting: Family Medicine

## 2024-07-03 ENCOUNTER — Encounter: Payer: Self-pay | Admitting: Family Medicine

## 2024-07-03 ENCOUNTER — Ambulatory Visit: Attending: Cardiovascular Disease | Admitting: Cardiovascular Disease

## 2024-07-03 VITALS — BP 105/60 | HR 55 | Ht 66.0 in | Wt 234.1 lb

## 2024-07-03 VITALS — BP 136/80 | HR 60 | Ht 66.0 in | Wt 234.4 lb

## 2024-07-03 DIAGNOSIS — I25119 Atherosclerotic heart disease of native coronary artery with unspecified angina pectoris: Secondary | ICD-10-CM

## 2024-07-03 DIAGNOSIS — M5412 Radiculopathy, cervical region: Secondary | ICD-10-CM

## 2024-07-03 DIAGNOSIS — I739 Peripheral vascular disease, unspecified: Secondary | ICD-10-CM | POA: Diagnosis not present

## 2024-07-03 DIAGNOSIS — M5010 Cervical disc disorder with radiculopathy, unspecified cervical region: Secondary | ICD-10-CM

## 2024-07-03 DIAGNOSIS — I10 Essential (primary) hypertension: Secondary | ICD-10-CM | POA: Diagnosis not present

## 2024-07-03 DIAGNOSIS — E782 Mixed hyperlipidemia: Secondary | ICD-10-CM

## 2024-07-03 DIAGNOSIS — R600 Localized edema: Secondary | ICD-10-CM

## 2024-07-03 DIAGNOSIS — Z720 Tobacco use: Secondary | ICD-10-CM

## 2024-07-03 MED ORDER — CYCLOBENZAPRINE HCL 10 MG PO TABS: 10.0000 mg | ORAL_TABLET | Freq: Three times a day (TID) | ORAL | 0 refills | Status: DC | PRN

## 2024-07-03 NOTE — Progress Notes (Signed)
 Cardiology Office Note:    Date:  07/03/2024   ID:  Jeffrey Winters, DOB 03-14-1966, MRN [id number]  PCP:  Chandra Toribio POUR, MD   [facility] Providers Cardiologist:  Ozell Fell, MD Cardiology APP:  Lelon Glendia ONEIDA DEVONNA     Referring MD: Oley Bascom RAMAN, NP   Chief Complaint  Patient presents with   Coronary Artery Disease    History of Present Illness:    Jeffrey Winters is a 58 y.o. male with a hx of:  Coronary artery disease S/p NSTEMI in 02/2016 tx with 2.25 x 18 mm Xience DES to OM1 s/p NSTEMI in 05/2016 S/p Inf STEMI 07/2019 >> s/p 3 x 38 mm DES to RCA; OM2 stent patent Residual RPL2 80% >> Med Rx Myoview  08/2019: small inf ischemia; low risk >> med Rx continued Cath 12/2021: Patent stent in the RCA, OM, moderate disease in the LAD negative by RFR, R PLA 80-med Rx (consider PCI of R PLA if refractory angina) TTE 12/10/21: EF 55, no RWMA, normal RVSF, RVSP 39.8, mildly elevated PASP, mild LAE, trivial MR  Limited TTE 06/05/22: EF 60-65, NL RVSF, mild LAE Peripheral Arterial Disease   S/p L SFA PTA 10/2019 S/p R SFA PTA 11/2019 US  02/19/23: R SFA 50-74, R mid SFA stent patent; L SFA 50-74, L mid SFA stent patent  Hypertension  Hyperlipidemia  Tobacco use COPD  The patient is here alone today. He has been having problems with numbness and pain in his right arm extending into the 4th and 5th fingers, ongoing for about a month. No chest pain or pressure. He has cut back on smoking to less than 1 ppd. He denies shortness of breath, orthopnea, PND, or heart palpitations. He complains of right leg swelling.  He denies pain in the calf.  States that this has been present for 3 to 4 weeks.  Notes that he started Repatha  a few months ago.  Current Medications: Current Meds  Medication Sig   albuterol  (VENTOLIN  HFA) 108 (90 Base) MCG/ACT inhaler INHALE 1-2 PUFFS INTO THE LUNGS EVERY 6 (SIX) HOURS AS NEEDED FOR WHEEZING OR SHORTNESS OF BREATH.   amLODipine  (NORVASC ) 2.5 MG  tablet TAKE 1 TABLET BY MOUTH EVERY DAY   ammonium lactate  (AMLACTIN) 12 % lotion Apply 1 Application topically as needed for dry skin.   aspirin  EC 81 MG tablet Take 1 tablet orally daily   atorvastatin  (LIPITOR ) 80 MG tablet TAKE 1 TABLET BY MOUTH EVERY DAY   buPROPion  (WELLBUTRIN  XL) 150 MG 24 hr tablet TAKE 1 TABLET BY MOUTH EVERYDAY AT BEDTIME (Patient taking differently: Take 150 mg by mouth as needed (for anxiety).)   clopidogrel  (PLAVIX ) 75 MG tablet TAKE 1 TABLET BY MOUTH EVERY DAY   diclofenac  Sodium (VOLTAREN ) 1 % GEL Apply 4 g topically 4 (four) times daily.   escitalopram  (LEXAPRO ) 10 MG tablet TAKE 1 TABLET BY MOUTH EVERY DAY   Evolocumab  (REPATHA  SURECLICK) 140 MG/ML SOAJ Inject 140 mg into the skin every 14 (fourteen) days.   famotidine  (PEPCID ) 20 MG tablet Take 1 tablet by mouth 2 times daily   isosorbide  mononitrate (IMDUR ) 60 MG 24 hr tablet TAKE 1.5 TABLETS (90 MG TOTAL) BY MOUTH DAILY.   metoprolol  succinate (TOPROL -XL) 25 MG 24 hr tablet TAKE 1 TABLET (25 MG TOTAL) BY MOUTH DAILY.   morphine  (MSIR) 15 MG tablet Take 0.5 tablets (7.5 mg total) by mouth every 4 (four) hours as needed.   nitroGLYCERIN  (NITROSTAT ) 0.4 MG  SL tablet Take 1 tablet sublingually every 5 minutes as needed for chest pain (Patient taking differently: Place 0.4 mg under the tongue every 5 (five) minutes as needed for chest pain.)   umeclidinium-vilanterol (ANORO ELLIPTA ) 62.5-25 MCG/ACT AEPB INHALE 1 PUFF INTO THE LUNGS DAILY AT 6 (SIX) AM.     Allergies:   Patient has no known allergies.   ROS:   Please see the history of present illness.    All other systems reviewed and are negative.  EKGs/Labs/Other Studies Reviewed:    The following studies were reviewed today: Cardiac Studies & Procedures   ______________________________________________________________________________________________ CARDIAC CATHETERIZATION  CARDIAC CATHETERIZATION 12/10/2021  Conclusion Severe stenosis of the right  PLA branch, noted on previous cath studies Moderate nonobstructive stenosis of the mid-LAD with negative pressure analysis (0.96) Patent LCx with no stenosis Patent RCA stents with no significant restenosis  Plan: continue anti-angina Rx, medical treatment. The right PLA branch could be treated with PCI if pt has medically refractory angina.  Findings Coronary Findings Diagnostic  Dominance: Right  Left Main Vessel is large. Vessel is angiographically normal.  Left Anterior Descending Vessel is large. The LAD and LCx have separate ostia Mid LAD lesion is 55% stenosed. Dist LAD lesion is 40% stenosed.  First Diagonal Branch Vessel is small in size.  Second Diagonal Branch Vessel is small in size.  Third Diagonal Branch Vessel is small in size.  Left Circumflex Vessel is large. The vessel exhibits minimal luminal irregularities. The LAD and LCx have separate ostia  First Obtuse Marginal Branch Vessel is small in size.  Second Obtuse Marginal Branch Vessel is large in size. Non-stenotic 2nd Mrg lesion was previously treated. Previously placed stent displays no restenosis.  Third Obtuse Marginal Branch Vessel is moderate in size.  Right Coronary Artery Vessel is large. Non-stenotic Mid RCA to Dist RCA lesion was previously treated. Vessel is the culprit lesion. The lesion is thrombotic. Non-stenotic Dist RCA lesion was previously treated.  Right Posterior Descending Artery Vessel is moderate in size.  First Right Posterolateral Branch Vessel is small in size.  Second Right Posterolateral Branch Vessel is moderate in size. 2nd RPL lesion is 80% stenosed.  Intervention  No interventions have been documented.   CARDIAC CATHETERIZATION  CARDIAC CATHETERIZATION 07/10/2019  Conclusion Conclusions: 1. Multivessel coronary artery disease with acute occlusion of distal RCA (culprit lesion) as well as 50-60% mid LAD and 80% rPL2 stenoses. 2. Patent stent in  OM2. 3. Moderately to severely elevated LVEDP (30-35 mmHg). 4. Successful primary PCI to the distal RCA using a Resolute Onyx 3.0 x38 mm DES with 0% residual stenosis and TIMI-3 flow.  Recommendations: 1. Admit to 2H-ICU for post-STEMI care. 2. Obtain transthoracic echocardiogram; ventriculogram was not performed due to significantly elevated LVEDP. 3. Dual antiplatelet therapy with aspirin  and prasugrel  for at least 12 months. 4. Aggressive secondary prevention.  Lonni Hanson, MD [facility]  Findings Coronary Findings Diagnostic  Dominance: Right  Left Main Vessel is large. Vessel is angiographically normal.  Left Anterior Descending Vessel is large. Mid LAD lesion is 55% stenosed. Dist LAD lesion is 40% stenosed.  First Diagonal Branch Vessel is small in size.  Second Diagonal Branch Vessel is small in size.  Third Diagonal Branch Vessel is small in size.  Left Circumflex Vessel is large. The vessel exhibits minimal luminal irregularities.  First Obtuse Marginal Branch Vessel is small in size.  Second Obtuse Marginal Branch Vessel is large in size. Previously placed 2nd Mrg stent (unknown type)  is widely patent. Previously placed stent displays no restenosis.  Third Obtuse Marginal Branch Vessel is moderate in size.  Right Coronary Artery Vessel is large. Mid RCA to Dist RCA lesion is 100% stenosed. Vessel is the culprit lesion. The lesion is thrombotic. Dist RCA lesion is 50% stenosed.  Right Posterior Descending Artery Vessel is moderate in size.  First Right Posterolateral Branch Vessel is small in size.  Second Right Posterolateral Branch Vessel is moderate in size. 2nd RPL lesion is 80% stenosed.  Intervention  Mid RCA to Dist RCA lesion Stent (Also treats lesions: Dist RCA) CATHETER LAUNCHER 6FR JR4 guide catheter was inserted. Pre-stent angioplasty was performed using a BALLOON SAPPHIRE 2.5X12. A drug-eluting stent was successfully  placed using a STENT RESOLUTE [id number]. Maximum pressure: 16 atm. Stent strut is well apposed. Post-stent angioplasty was performed using a BALLOON SAPPHIRE [place] 3.5X12. Maximum pressure:  18 atm. Post-Intervention Lesion Assessment The intervention was successful. Pre-interventional TIMI flow is 0. Post-intervention TIMI flow is 3. No complications occurred at this lesion. There is a 0% residual stenosis post intervention.  Dist RCA lesion Stent (Also treats lesions: Mid RCA to Dist RCA) See details in Mid RCA to Dist RCA lesion. Post-Intervention Lesion Assessment The intervention was successful. Pre-interventional TIMI flow is 0. Post-intervention TIMI flow is 3. No complications occurred at this lesion. There is a 0% residual stenosis post intervention.   STRESS TESTS  MYOCARDIAL PERFUSION IMAGING 08/25/2019  Interpretation Summary  Nuclear stress EF: 61%.  Findings consistent with ischemia.  This is a low risk study.  The left ventricular ejection fraction is normal (55-65%).  Small area of moderate ischemia in the inferior base LV appears enlarged with no RWMAls EF 61%   ECHOCARDIOGRAM  ECHOCARDIOGRAM LIMITED 06/05/2022  Narrative ECHOCARDIOGRAM LIMITED REPORT    Patient Name:   Jeffrey Winters Date of Exam: 06/05/2022 Medical Rec #:  [id number]      Height:       68.0 in Accession #:    [id number]     Weight:       231.0 lb Date of Birth:  10-Mar-1966      BSA:          2.173 m Patient Age:    56 years       BP:           128/74 mmHg Patient Gender: M              HR:           78 bpm. Exam Location:  [address]  Procedure: Limited Echo, Strain Analysis, Limited Color Doppler and Cardiac Doppler  Indications:    R07.9 Chest Pain  History:        Patient has prior history of Echocardiogram examinations, most recent 12/10/2021. CAD and Previous Myocardial Infarction, PAD and COPD, Signs/Symptoms:Chest Pain; Risk Factors:Hypertension, Family History of Coronary  Artery Disease, Dyslipidemia and Current Smoker.  Sonographer:    Heather Hawks RDCS Referring Phys: GLENDIA DASEN WEAVER  IMPRESSIONS   1. GLS borderline low -17%; however may not be as accurate with variable strain in different views. Left ventricular ejection fraction, by estimation, is 60 to 65%. The left ventricle has normal function. 2. Right ventricular systolic function is normal. The right ventricular size is normal. 3. Left atrial size was mildly dilated. 4. No evidence of mitral valve regurgitation. 5. The inferior vena cava is normal in size with greater than 50% respiratory variability, suggesting right atrial pressure of 3  mmHg.  FINDINGS Left Ventricle: GLS borderline low -17%; however may not be as accurate with variable strain in different views. Left ventricular ejection fraction, by estimation, is 60 to 65%. The left ventricle has normal function.  Right Ventricle: The right ventricular size is normal. Right ventricular systolic function is normal.  Left Atrium: Left atrial size was mildly dilated.  Right Atrium: Right atrial size was normal in size.  Pericardium: There is no evidence of pericardial effusion.  Tricuspid Valve: Tricuspid valve regurgitation is not demonstrated.  Venous: The inferior vena cava is normal in size with greater than 50% respiratory variability, suggesting right atrial pressure of 3 mmHg.  IAS/Shunts: No atrial level shunt detected by color flow Doppler.  LEFT VENTRICLE PLAX 2D LVIDd:         5.40 cm   Diastology LVIDs:         3.30 cm   LV e' medial:    6.64 cm/s LV PW:         0.80 cm   LV E/e' medial:  11.7 LV IVS:        0.90 cm   LV e' lateral:   10.90 cm/s LVOT diam:     2.30 cm   LV E/e' lateral: 7.1 LV SV:         80 LV SV Index:   37        2D Longitudinal Strain LVOT Area:     4.15 cm  2D Strain GLS (A2C):   -15.6 % 2D Strain GLS (A3C):   -17.2 % 2D Strain GLS (A4C):   -19.4 % 2D Strain GLS Avg:     -17.4 %  RIGHT  VENTRICLE RV Basal diam:  4.40 cm RV Mid diam:    3.40 cm RV S prime:     14.00 cm/s TAPSE (M-mode): 2.6 cm  LEFT ATRIUM             Index        RIGHT ATRIUM           Index LA diam:        4.20 cm 1.93 cm/m   RA Area:     19.20 cm LA Vol (A2C):   40.5 ml 18.64 ml/m  RA Volume:   52.40 ml  24.12 ml/m LA Vol (A4C):   30.4 ml 13.99 ml/m LA Biplane Vol: 35.9 ml 16.52 ml/m AORTIC VALVE LVOT Vmax:   106.50 cm/s LVOT Vmean:  69.200 cm/s LVOT VTI:    0.193 m  AORTA Ao Root diam: 3.10 cm Ao Asc diam:  3.10 cm  MITRAL VALVE MV Area (PHT): cm         SHUNTS MV Decel Time: 211 msec    Systemic VTI:  0.19 m MV E velocity: 77.60 cm/s  Systemic Diam: 2.30 cm MV A velocity: 71.70 cm/s MV E/A ratio:  1.08  Photographer signed by Ronal Ross Signature Date/Time: 06/05/2022/10:31:04 AM    Final          ______________________________________________________________________________________________      EKG:   EKG Interpretation Date/Time:  Monday July 03 2024 08:15:49 EST Ventricular Rate:  60 PR Interval:  198 QRS Duration:  108 QT Interval:  386 QTC Calculation: 386 R Axis:   26  Text Interpretation: Normal sinus rhythm Inferior infarct , age undetermined When compared with ECG of 07-Jun-2023 08:43, No significant change was found Confirmed by Wonda Sharper [phone]) on 07/03/2024 8:24:47 AM    Recent Labs: 09/20/2023: Hemoglobin  13.9; Platelets 201 03/28/2024: ALT 30; BUN 13; Creatinine, Ser 0.97; Potassium 4.1; Sodium 138  Recent Lipid Panel    Component Value Date/Time   CHOL 139 03/06/2024 1044   TRIG 128 03/06/2024 1044   HDL 38 (L) 03/06/2024 1044   CHOLHDL 3.7 03/06/2024 1044   CHOLHDL 3.5 12/10/2021 0322   VLDL 23 12/10/2021 0322   LDLCALC 78 03/06/2024 1044   LDLDIRECT 86 01/25/2024 0953     Risk Assessment/Calculations:                Physical Exam:    VS:  BP 136/80 (BP Location: Left Arm, Patient Position: Sitting, Cuff  Size: Normal)   Pulse 60   Ht 5' 6 (1.676 m)   Wt 234 lb 6.4 oz (106.3 kg)   SpO2 95%   BMI 37.83 kg/m     Wt Readings from Last 3 Encounters:  07/03/24 234 lb 6.4 oz (106.3 kg)  03/13/24 234 lb 6.4 oz (106.3 kg)  01/25/24 232 lb (105.2 kg)     GEN:  Well nourished, well developed in no acute distress HEENT: Normal NECK: No JVD; No carotid bruits LYMPHATICS: No lymphadenopathy CARDIAC: RRR, no murmurs, rubs, gallops RESPIRATORY:  Clear to auscultation without rales, wheezing or rhonchi  ABDOMEN: Soft, non-tender, non-distended MUSCULOSKELETAL: 1+ right pretibial and ankle edema; No deformity  SKIN: Warm and dry NEUROLOGIC:  Alert and oriented x 3 PSYCHIATRIC:  Normal affect   Assessment & Plan Coronary artery disease involving native coronary artery of native heart with angina pectoris Antianginal program includes isosorbide , metoprolol  succinate, and amlodipine .  Patient has been maintained on DAPT with aspirin  and clopidogrel . PAD (peripheral artery disease) No claudication at present.  On DAPT and atorvastatin /Repatha . Mixed hyperlipidemia On atorvastatin  80 mg daily.  Last LDL cholesterol was 78 mg/dL.  Repatha  added.  Repeat lipids and LFTs today. Essential hypertension Blood pressure well-controlled on current medical therapy.  Continue low-dose amlodipine , isosorbide , and metoprolol  succinate. Tobacco abuse Cessation counseling done.  He has gone from 2 packs/day to less than 1 pack/day.  He understands the importance of complete cessation. Leg edema, right No pain in the calf.  However, with asymmetric edema recommend venous duplex of the right leg.            Medication Adjustments/Labs and Tests Ordered: Current medicines are reviewed at length with the patient today.  Concerns regarding medicines are outlined above.  Orders Placed This Encounter  Procedures   Hepatic function panel   Lipid panel   EKG 12-Lead   VAS US  LOWER EXTREMITY ARTERIAL DUPLEX    VAS US  ABI WITH/WO TBI   No orders of the defined types were placed in this encounter.   Patient Instructions  Medication Instructions:   No changes  *If you need a refill on your cardiac medications before your next appointment, please call your pharmacy*   Lab Work: Lipid Liver panel If you have labs (blood work) drawn today and your tests are completely normal, you will receive your results only by: MyChart Message (if you have MyChart) OR A paper copy in the mail If you have any lab test that is abnormal or we need to change your treatment, we will call you to review the results.   Testing/Procedures: 1) Your physician has requested that you have an ankle brachial index (ABI). During this test an ultrasound and blood pressure cuff are used to evaluate the arteries that supply the arms and legs with blood. Allow thirty minutes  for this exam. There are no restrictions or special instructions.  Please note: We ask at that you not bring children with you during ultrasound (echo/ vascular) testing. Due to room size and safety concerns, children are not allowed in the ultrasound rooms during exams. Our front office staff cannot provide observation of children in our lobby area while testing is being conducted. An adult accompanying a patient to their appointment will only be allowed in the ultrasound room at the discretion of the ultrasound technician under special circumstances. We apologize for any inconvenience.   2) Your physician has requested that you have a lower extremity arterial duplex. This test is an ultrasound of the arteries in the legs. It looks at arterial blood flow in the legs. There are no restrictions or special instructions.  Please note: We ask at that you not bring children with you during ultrasound (echo/ vascular) testing. Due to room size and safety concerns, children are not allowed in the ultrasound rooms during exams. Our front office staff cannot provide  observation of children in our lobby area while testing is being conducted. An adult accompanying a patient to their appointment will only be allowed in the ultrasound room at the discretion of the ultrasound technician under special circumstances. We apologize for any inconvenience.   Follow-Up: At [facility], you and your health needs are our priority.  As part of our continuing mission to provide you with exceptional heart care, we have created designated Provider Care Teams.  These Care Teams include your primary Cardiologist (physician) and Advanced Practice Providers (APPs -  Physician Assistants and Nurse Practitioners) who all work together to provide you with the care you need, when you need it.     Your next appointment:   12 month(s)  The format for your next appointment:   In Person  Provider:   Ozell Fell, MD   Other Instructions    Signed, Ozell Fell, MD  07/03/2024 12:53 PM    [facility]

## 2024-07-03 NOTE — Assessment & Plan Note (Signed)
-   Chronic neck pain with radiation down the right arm into the 4th and 5th digits, consistent with previous presentations. Findings are supported by a 2023 cervical spine MRI showing disc degeneration, spinal canal stenosis, and severe foraminal narrowing. Previous epidural steroid injections provided temporary relief. Examination today reveals decreased sensation and grip strength on the right. - Prescribed a muscle relaxant. - Referred for a repeat epidural steroid injection at [facility]. - Referred for physical therapy. - Discussed oral pain management with Tylenol . - Counseled on the possibility of needing a neurosurgical evaluation if conservative measures fail, explaining the nature of disc pathology and surgical options like discectomy or fusion.

## 2024-07-03 NOTE — Progress Notes (Unsigned)
   Acute Office Visit  Subjective:     Patient ID: Jeffrey Winters, male    DOB: 05-14-1966, 58 y.o.   MRN: [id number]  Chief Complaint  Patient presents with   Numbness   Arm Problem    HPI Patient is in today for   Subjective - Right neck pain radiating down the right arm with numbness. Described as affecting the back of the arm up to the muscle. Numbness is primarily in the ring and little fingers. Pain is constant unless a comfortable position is found. Sitting for long periods can exacerbate pain. Reports difficulty with grip strength. - History of similar episodes. Previous epidural steroid injection about one year ago (January 2024 at [facility]) provided relief. Another injection was administered about six months ago. Symptoms recurred and worsened four weeks ago, with increased numbness.  Medications: Takes baby aspirin  daily. Tylenol  is okay to use. Advised against NSAIDs like ibuprofen due to potential for kidney injury, increased heart attack risk, and stomach ulcers.  PMH, PSH, FH, Social Hx PMHx: Chronic neck pain, disc degeneration, spinal canal stenosis, severe foraminal narrowing per 2023 cervical spine MRI. Right knee pain. PSH: Cervical epidural steroid injections (last one in January 2024).  ROS Neurologic: Positive for right arm pain radiation, numbness in the 4th and 5th digits of the right hand, and weakness. Musculoskeletal: Positive for neck pain and stiffness.     ROS      Objective:    BP 105/60   Pulse (!) 55   Ht 5' 6 (1.676 m)   Wt 234 lb 1.9 oz (106.2 kg)   SpO2 98%   BMI 37.79 kg/m  {Vitals History (Optional):23777}  Physical Exam Gen: alert, oriented Neuro: ttp over right trapezius.  Dulled sensation over posterior right arm.  Strength in fingers equal b/l.   Pulm: no respiratory distress Psych: pleasant affect  No results found for any visits on 07/03/24.      Assessment & Plan:   There are no diagnoses linked to  this encounter.   No follow-ups on file.  Toribio MARLA [name], MD

## 2024-07-03 NOTE — Assessment & Plan Note (Signed)
 No claudication at present.  On DAPT and atorvastatin /Repatha .

## 2024-07-03 NOTE — Assessment & Plan Note (Signed)
 Blood pressure well-controlled on current medical therapy.  Continue low-dose amlodipine , isosorbide , and metoprolol  succinate.

## 2024-07-03 NOTE — Assessment & Plan Note (Signed)
 Antianginal program includes isosorbide , metoprolol  succinate, and amlodipine .  Patient has been maintained on DAPT with aspirin  and clopidogrel .

## 2024-07-03 NOTE — Patient Instructions (Signed)
 Medication Instructions:   No changes  *If you need a refill on your cardiac medications before your next appointment, please call your pharmacy*   Lab Work: Lipid Liver panel If you have labs (blood work) drawn today and your tests are completely normal, you will receive your results only by: MyChart Message (if you have MyChart) OR A paper copy in the mail If you have any lab test that is abnormal or we need to change your treatment, we will call you to review the results.   Testing/Procedures: 1) Your physician has requested that you have an ankle brachial index (ABI). During this test an ultrasound and blood pressure cuff are used to evaluate the arteries that supply the arms and legs with blood. Allow thirty minutes for this exam. There are no restrictions or special instructions.  Please note: We ask at that you not bring children with you during ultrasound (echo/ vascular) testing. Due to room size and safety concerns, children are not allowed in the ultrasound rooms during exams. Our front office staff cannot provide observation of children in our lobby area while testing is being conducted. An adult accompanying a patient to their appointment will only be allowed in the ultrasound room at the discretion of the ultrasound technician under special circumstances. We apologize for any inconvenience.   2) Your physician has requested that you have a lower extremity arterial duplex. This test is an ultrasound of the arteries in the legs. It looks at arterial blood flow in the legs. There are no restrictions or special instructions.  Please note: We ask at that you not bring children with you during ultrasound (echo/ vascular) testing. Due to room size and safety concerns, children are not allowed in the ultrasound rooms during exams. Our front office staff cannot provide observation of children in our lobby area while testing is being conducted. An adult accompanying a patient to their  appointment will only be allowed in the ultrasound room at the discretion of the ultrasound technician under special circumstances. We apologize for any inconvenience.   Follow-Up: At [facility], you and your health needs are our priority.  As part of our continuing mission to provide you with exceptional heart care, we have created designated Provider Care Teams.  These Care Teams include your primary Cardiologist (physician) and Advanced Practice Providers (APPs -  Physician Assistants and Nurse Practitioners) who all work together to provide you with the care you need, when you need it.     Your next appointment:   12 month(s)  The format for your next appointment:   In Person  Provider:   Ozell Fell, MD   Other Instructions

## 2024-07-03 NOTE — Assessment & Plan Note (Signed)
 Cessation counseling done.  He has gone from 2 packs/day to less than 1 pack/day.  He understands the importance of complete cessation.

## 2024-07-03 NOTE — Patient Instructions (Addendum)
 It was nice to see you today,  We addressed the following topics today: - Take the muscle relaxant I prescribed, starting with one dose at night as it can cause drowsiness. You may take it during the day once you are used to it. - Use Tylenol  for pain as needed. Avoid NSAIDs like Advil or ibuprofen. - You can use a heating pad on your neck for relief. - Perform gentle neck stretches: use your left hand to gently pull your head toward your left shoulder for 10 seconds, and repeat in all directions. - Someone will call you to schedule the epidural steroid injection. If you have not heard from them within two weeks, please contact our office. - I have also placed a referral for physical therapy.someone will call to schedule this.   Have a great day,  Rolan [name], MD

## 2024-07-04 LAB — LIPID PANEL
Chol/HDL Ratio: 2.1 ratio (ref 0.0–5.0)
Cholesterol, Total: 85 mg/dL — ABNORMAL LOW (ref 100–199)
HDL: 40 mg/dL (ref >39)
LDL Chol Calc (NIH): 16 mg/dL (ref 0–99)
Triglycerides: 183 mg/dL — ABNORMAL HIGH (ref 0–149)
VLDL Cholesterol Cal: 29 mg/dL (ref 5–40)

## 2024-07-04 LAB — HEPATIC FUNCTION PANEL
ALT: 41 IU/L (ref 0–44)
AST: 34 IU/L (ref 0–40)
Albumin: 4.2 g/dL (ref 3.8–4.9)
Alkaline Phosphatase: 144 IU/L — ABNORMAL HIGH (ref 47–123)
Bilirubin Total: 0.3 mg/dL (ref 0.0–1.2)
Bilirubin, Direct: 0.12 mg/dL (ref 0.00–0.40)
Total Protein: 6.6 g/dL (ref 6.0–8.5)

## 2024-07-08 ENCOUNTER — Ambulatory Visit: Payer: Self-pay | Admitting: Cardiovascular Disease

## 2024-07-08 DIAGNOSIS — I739 Peripheral vascular disease, unspecified: Secondary | ICD-10-CM

## 2024-07-10 NOTE — Telephone Encounter (Signed)
 Letter of results sent to pt

## 2024-07-10 NOTE — Telephone Encounter (Signed)
 Patient says he is returning a call regarding this matter.

## 2024-07-11 ENCOUNTER — Telehealth: Payer: Self-pay | Admitting: Cardiovascular Disease

## 2024-07-11 NOTE — Telephone Encounter (Signed)
Pt returning call from yesterday. Please advise 

## 2024-07-11 NOTE — Telephone Encounter (Signed)
 Pt contacted and resulted lab results. Pt agrees with plan of care.

## 2024-07-11 NOTE — Telephone Encounter (Signed)
 Patient is returning call. Please advise?

## 2024-07-21 ENCOUNTER — Emergency Department (HOSPITAL_COMMUNITY)
Admission: EM | Admit: 2024-07-21 | Discharge: 2024-07-21 | Disposition: A | Discharge status: Home / Self Care | Attending: Emergency Medicine | Admitting: Emergency Medicine

## 2024-07-21 ENCOUNTER — Emergency Department (HOSPITAL_COMMUNITY)

## 2024-07-21 ENCOUNTER — Encounter (HOSPITAL_COMMUNITY): Payer: Self-pay

## 2024-07-21 ENCOUNTER — Other Ambulatory Visit: Payer: Self-pay

## 2024-07-21 DIAGNOSIS — I110 Hypertensive heart disease with heart failure: Secondary | ICD-10-CM | POA: Insufficient documentation

## 2024-07-21 DIAGNOSIS — I2510 Atherosclerotic heart disease of native coronary artery without angina pectoris: Secondary | ICD-10-CM | POA: Insufficient documentation

## 2024-07-21 DIAGNOSIS — F17200 Nicotine dependence, unspecified, uncomplicated: Secondary | ICD-10-CM | POA: Insufficient documentation

## 2024-07-21 DIAGNOSIS — R609 Edema, unspecified: Secondary | ICD-10-CM

## 2024-07-21 DIAGNOSIS — J449 Chronic obstructive pulmonary disease, unspecified: Secondary | ICD-10-CM | POA: Insufficient documentation

## 2024-07-21 DIAGNOSIS — R0602 Shortness of breath: Secondary | ICD-10-CM | POA: Insufficient documentation

## 2024-07-21 DIAGNOSIS — Z7951 Long term (current) use of inhaled steroids: Secondary | ICD-10-CM | POA: Diagnosis not present

## 2024-07-21 DIAGNOSIS — I509 Heart failure, unspecified: Secondary | ICD-10-CM | POA: Diagnosis not present

## 2024-07-21 DIAGNOSIS — M7989 Other specified soft tissue disorders: Secondary | ICD-10-CM | POA: Insufficient documentation

## 2024-07-21 DIAGNOSIS — M79609 Pain in unspecified limb: Secondary | ICD-10-CM

## 2024-07-21 DIAGNOSIS — Z79899 Other long term (current) drug therapy: Secondary | ICD-10-CM | POA: Diagnosis not present

## 2024-07-21 DIAGNOSIS — Z7982 Long term (current) use of aspirin: Secondary | ICD-10-CM | POA: Insufficient documentation

## 2024-07-21 DIAGNOSIS — R2241 Localized swelling, mass and lump, right lower limb: Secondary | ICD-10-CM

## 2024-07-21 LAB — BASIC METABOLIC PANEL WITH GFR
Anion gap: 10 (ref 5–15)
BUN: 13 mg/dL (ref 6–20)
CO2: 25 mmol/L (ref 22–32)
Calcium: 9 mg/dL (ref 8.9–10.3)
Chloride: 103 mmol/L (ref 98–111)
Creatinine, Ser: 0.89 mg/dL (ref 0.61–1.24)
GFR, Estimated: >60 mL/min
Glucose, Bld: 89 mg/dL (ref 70–99)
Potassium: 4.2 mmol/L (ref 3.5–5.1)
Sodium: 138 mmol/L (ref 135–145)

## 2024-07-21 LAB — CBC
HCT: 44.5 % (ref 39.0–52.0)
Hemoglobin: 14.6 g/dL (ref 13.0–17.0)
MCH: 28.3 pg (ref 26.0–34.0)
MCHC: 32.8 g/dL (ref 30.0–36.0)
MCV: 86.4 fL (ref 80.0–100.0)
Platelets: 169 K/uL (ref 150–400)
RBC: 5.15 MIL/uL (ref 4.22–5.81)
RDW: 13.7 % (ref 11.5–15.5)
WBC: 3.8 K/uL — ABNORMAL LOW (ref 4.0–10.5)
nRBC: 0 % (ref 0.0–0.2)

## 2024-07-21 LAB — D-DIMER, QUANTITATIVE: D-Dimer, Quant: 0.38 ug{FEU}/mL (ref 0.00–0.50)

## 2024-07-21 LAB — PROTIME-INR
INR: 0.9 (ref 0.8–1.2)
Prothrombin Time: 13.2 s (ref 11.4–15.2)

## 2024-07-21 MED ORDER — FUROSEMIDE 20 MG PO TABS: 20.0000 mg | ORAL_TABLET | Freq: Every day | ORAL | 0 refills | Status: AC

## 2024-07-21 MED ORDER — CEPHALEXIN 500 MG PO CAPS: 500.0000 mg | ORAL_CAPSULE | Freq: Three times a day (TID) | ORAL | 0 refills | Status: AC

## 2024-07-21 MED ORDER — POTASSIUM CHLORIDE CRYS ER 20 MEQ PO TBCR: 20.0000 meq | EXTENDED_RELEASE_TABLET | Freq: Two times a day (BID) | ORAL | 0 refills | Status: AC

## 2024-07-21 MED ORDER — IPRATROPIUM-ALBUTEROL 0.5-2.5 (3) MG/3ML IN SOLN
3.0000 mL | Freq: Once | RESPIRATORY_TRACT | Status: AC
  Administered 2024-07-21: 3 mL via RESPIRATORY_TRACT
  Filled 2024-07-21: qty 3

## 2024-07-21 NOTE — Discharge Instructions (Addendum)
 As we discussed, your workup in the ER today was reassuring for acute findings.  Laboratory evaluation, x-ray, and ultrasound imaging did not reveal any emergent concerns.  You do not have a blood clot in your leg.  Given that you report that she received a fluid pill and this happened previously, I have given you a prescription for Lasix  which she should take as prescribed in its entirety to help with the swelling.  Given that Lasix  can make your potassium low, I have given you a few days of potassium supplementation should take as well.  Additionally, I have prescribed you Keflex  which you need antibiotic you should fill and take as prescribed in its entirety to cover for an infection.  Please call your PCP to schedule a close follow-up appointment.  Return if development of any new or worsening symptoms.

## 2024-07-21 NOTE — ED Triage Notes (Signed)
 Pt presents for R. Leg swelling with redness and some SOB.  Sent by an UC for DVT rule out. Pt ambulated in independently and does not appear in distress at this time.7/10 pain

## 2024-07-21 NOTE — Progress Notes (Signed)
 VASCULAR LAB    Right lower extremity venous duplex has been performed.  See CV proc for preliminary results.  Relayed results to Sarah Smoot, PA-C via secure chat  Berthe Oley, RVT 07/21/2024, 4:42 PM

## 2024-07-21 NOTE — ED Provider Triage Note (Signed)
 Emergency Medicine Provider Triage Evaluation Note  DAWSEN KRIEGER , a 58 y.o. male  was evaluated in triage with a history of PAD, MI, hypertension, and COPD who presents with right leg swelling.  The patient noticed the swelling approximately 1 day ago, he was seen by urgent care today who sent him to the emergency department for DVT rule out. Associated symptoms include shortness of breath which she is unsure if that is correlated or his chronic COPD.  Patient states that he has noticed some slight redness to the lower right extremity.  Denies fevers.  The patient reports no difficulty ambulating.  The patient denies any numbness, tingling, or any other  neurological changes.  The patient is in no acute distress. Review of Systems  Positive: Edema Negative:   Physical Exam  BP 134/74   Pulse 63   Temp 97.9 F (36.6 C)   Resp 18   Ht 5' 6 (1.676 m)   Wt 110.2 kg   SpO2 97%   BMI 39.22 kg/m  Gen:   Awake, no distress   Resp:  Normal effort  MSK:   Moves extremities without difficulty  Other:  Neurovascular intact on exam -2+ DP pulses bilat  Medical Decision Making  Medically screening exam initiated at 1:14 PM.  Appropriate orders placed.  Warren LITTIE Sayres was informed that the remainder of the evaluation will be completed by another provider, this initial triage assessment does not replace that evaluation, and the importance of remaining in the ED until their evaluation is complete.    Willma Duwaine LITTIE, [place] 07/21/24 1317

## 2024-07-21 NOTE — ED Notes (Signed)
 Patient transported to vascular.

## 2024-07-21 NOTE — ED Provider Notes (Signed)
 " [place] [facility] Provider Note   CSN: [id number] Arrival date & time: 07/21/24  1228     Patient presents with: No chief complaint on file.   Jeffrey Winters is a 58 y.o. male.   Patient with history of COPD, CAD, CHF, hyperlipidemia, hypertension, MI, PAD, tobacco use presents today with complaints of right leg swelling.  Reports that for the last 4 weeks he has noticed that his right leg is more swollen.  He denies any pain in the leg.  Does report that a few years ago this happened and he saw his cardiologist who gave him a fluid pill which made the swelling go away. Reports that he went to urgent care to get a prescription for this and was told he should come here for an US  to rule out DVT. Denies fevers or chills. Reports that he has shortness of breath at baseline due to COPD and continued smoking, does not feel any more short of breath than what is baseline for him. No chest pain. Denies cough or congestion. No nausea, vomiting, or diarrhea.   The history is provided by the patient. No language interpreter was used.       Prior to Admission medications  Medication Sig Start Date End Date Taking? Authorizing Provider  albuterol  (VENTOLIN  HFA) 108 (90 Base) MCG/ACT inhaler INHALE 1-2 PUFFS INTO THE LUNGS EVERY 6 (SIX) HOURS AS NEEDED FOR WHEEZING OR SHORTNESS OF BREATH. 03/13/24 03/13/25  Chandra Toribio POUR, MD  amLODipine  (NORVASC ) 2.5 MG tablet TAKE 1 TABLET BY MOUTH EVERY DAY 07/07/23   Lelon Hamilton T, PA-C  ammonium lactate  (AMLACTIN) 12 % lotion Apply 1 Application topically as needed for dry skin. 08/13/22   Oley Bascom RAMAN, NP  aspirin  EC 81 MG tablet Take 1 tablet orally daily 03/13/21     atorvastatin  (LIPITOR ) 80 MG tablet TAKE 1 TABLET BY MOUTH EVERY DAY 04/06/24   Lelon Hamilton T, PA-C  buPROPion  (WELLBUTRIN  XL) 150 MG 24 hr tablet TAKE 1 TABLET BY MOUTH EVERYDAY AT BEDTIME Patient taking differently: Take 150 mg by mouth as needed (for  anxiety). 09/01/23   Oley Bascom RAMAN, NP  clopidogrel  (PLAVIX ) 75 MG tablet TAKE 1 TABLET BY MOUTH EVERY DAY 04/06/24   Lelon Hamilton T, PA-C  cyclobenzaprine  (FLEXERIL ) 10 MG tablet Take 1 tablet (10 mg total) by mouth 3 (three) times daily as needed for muscle spasms. 07/03/24   Chandra Toribio POUR, MD  diclofenac  Sodium (VOLTAREN ) 1 % GEL Apply 4 g topically 4 (four) times daily. 06/21/23   Emil Share, DO  escitalopram  (LEXAPRO ) 10 MG tablet TAKE 1 TABLET BY MOUTH EVERY DAY 02/10/24   Nichols, Tonya S, NP  Evolocumab  (REPATHA  SURECLICK) 140 MG/ML SOAJ Inject 140 mg into the skin every 14 (fourteen) days. 03/06/24   Court Dorn PARAS, MD  famotidine  (PEPCID ) 20 MG tablet Take 1 tablet by mouth 2 times daily 03/13/21     isosorbide  mononitrate (IMDUR ) 60 MG 24 hr tablet TAKE 1.5 TABLETS (90 MG TOTAL) BY MOUTH DAILY. 02/11/24   Court Dorn PARAS, MD  metoprolol  succinate (TOPROL -XL) 25 MG 24 hr tablet TAKE 1 TABLET (25 MG TOTAL) BY MOUTH DAILY. 08/24/23   Wonda Sharper, MD  morphine  (MSIR) 15 MG tablet Take 0.5 tablets (7.5 mg total) by mouth every 4 (four) hours as needed. 06/22/23   Emil Share, DO  nitroGLYCERIN  (NITROSTAT ) 0.4 MG SL tablet Take 1 tablet sublingually every 5 minutes as needed for  chest pain Patient taking differently: Place 0.4 mg under the tongue every 5 (five) minutes as needed for chest pain. 05/20/22   Lelon Hamilton T, PA-C  umeclidinium-vilanterol (ANORO ELLIPTA ) 62.5-25 MCG/ACT AEPB INHALE 1 PUFF INTO THE LUNGS DAILY AT 6 (SIX) AM. 03/13/24   Chandra Toribio POUR, MD    Allergies: Patient has no known allergies.    Review of Systems  Cardiovascular:  Positive for leg swelling.  All other systems reviewed and are negative.   Updated Vital Signs BP 131/78   Pulse (!) 56   Temp (!) 96.3 F (35.7 C) (Temporal)   Resp 16   Ht 5' 6 (1.676 m)   Wt 110.2 kg   SpO2 100%   BMI 39.22 kg/m   Physical Exam Vitals and nursing note reviewed.  Constitutional:      General: He is not in  acute distress.    Appearance: Normal appearance. He is normal weight. He is not ill-appearing, toxic-appearing or diaphoretic.  HENT:     Head: Normocephalic and atraumatic.  Cardiovascular:     Rate and Rhythm: Normal rate.  Pulmonary:     Effort: Pulmonary effort is normal. No respiratory distress.     Comments: Trace wheezing noted at bilateral lung bases. Abdominal:     General: Abdomen is flat.     Palpations: Abdomen is soft.     Tenderness: There is no abdominal tenderness.  Musculoskeletal:        General: Normal range of motion.     Cervical back: Normal range of motion.     Comments: RLE with edema noted without erythema, warmth, or tenderness to palpation. No wounds or drainage. Good DP and PT pulses. Full ROM intact.  Skin:    General: Skin is warm and dry.  Neurological:     General: No focal deficit present.     Mental Status: He is alert.  Psychiatric:        Mood and Affect: Mood normal.        Behavior: Behavior normal.     (all labs ordered are listed, but only abnormal results are displayed) Labs Reviewed  CBC - Abnormal; Notable for the following components:      Result Value   WBC 3.8 (*)    All other components within normal limits  BASIC METABOLIC PANEL WITH GFR  PROTIME-INR  D-DIMER, QUANTITATIVE    EKG: EKG Interpretation Date/Time:  Friday July 21 2024 12:39:12 EST Ventricular Rate:  68 PR Interval:  204 QRS Duration:  100 QT Interval:  376 QTC Calculation: 399 R Axis:   36  Text Interpretation: Normal sinus rhythm Normal ECG When compared with ECG of 03-Jul-2024 08:15, PREVIOUS ECG IS PRESENT Confirmed by Cottie Cough [phone]) on 07/21/2024 3:47:48 PM  Radiology: VAS US  LOWER EXTREMITY VENOUS (DVT) (7a-5p) Result Date: 07/21/2024  Lower Venous DVT Study Patient Name:  JESSON FOSKEY Carder  Date of Exam:   07/21/2024 Medical Rec #: [id number]       Accession #:    [id number] Date of Birth: May 28, 1966       Patient Gender: M Patient Age:    58 years Exam Location:  [facility] Procedure:      VAS US  LOWER EXTREMITY VENOUS (DVT) Referring Phys: MEGAN LLOYD --------------------------------------------------------------------------------  Indications: Edema, and Right foot and ankle pain.  Limitations: Body habitus, poor ultrasound/tissue interface and Subcutaneous edema. Comparison Study: No prior LEV on file Performing Technologist: Alberta Lis RVS  Examination Guidelines: A complete evaluation  includes B-mode imaging, spectral Doppler, color Doppler, and power Doppler as needed of all accessible portions of each vessel. Bilateral testing is considered an integral part of a complete examination. Limited examinations for reoccurring indications may be performed as noted. The reflux portion of the exam is performed with the patient in reverse Trendelenburg.  +---------+---------------+---------+-----------+----------+---------------+ RIGHT    CompressibilityPhasicitySpontaneityPropertiesThrombus Aging  +---------+---------------+---------+-----------+----------+---------------+ CFV      Full           Yes      Yes                                  +---------+---------------+---------+-----------+----------+---------------+ SFJ      Full                                                         +---------+---------------+---------+-----------+----------+---------------+ FV Prox  Full           Yes      Yes                                  +---------+---------------+---------+-----------+----------+---------------+ FV Mid   Full                                                         +---------+---------------+---------+-----------+----------+---------------+ FV DistalFull           Yes      No                                   +---------+---------------+---------+-----------+----------+---------------+ PFV      Full                                                          +---------+---------------+---------+-----------+----------+---------------+ POP      Full           Yes      Yes                                  +---------+---------------+---------+-----------+----------+---------------+ PTV                                                   patent by color +---------+---------------+---------+-----------+----------+---------------+ PERO                                                  patent by color +---------+---------------+---------+-----------+----------+---------------+ ATV  patent by color +---------+---------------+---------+-----------+----------+---------------+   +----+---------------+---------+-----------+------------------+--------------+ LEFTCompressibilityPhasicitySpontaneityProperties        Thrombus Aging +----+---------------+---------+-----------+------------------+--------------+ CFV Full           Yes      No         pulsatile waveform               +----+---------------+---------+-----------+------------------+--------------+ SFJ Full                                                                +----+---------------+---------+-----------+------------------+--------------+     Summary: RIGHT: - There is no evidence of deep vein thrombosis in the lower extremity.  - No cystic structure found in the popliteal fossa.  LEFT: - No evidence of common femoral vein obstruction.   *See table(s) above for measurements and observations.    Preliminary    DG Tibia/Fibula Right Result Date: 07/21/2024 CLINICAL DATA:  Swelling EXAM: RIGHT TIBIA AND FIBULA - 2 VIEW COMPARISON:  04/17/2024 FINDINGS: No fracture or malalignment. Diffuse soft tissue edema without emphysema. Vascular calcifications. 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 No acute osseous abnormality. Electronically Signed   By: Luke Bun M.D.   On: 07/21/2024 15:21    DG Chest 2 View Result Date: 07/21/2024 EXAM: 2 VIEW(S) XRAY OF THE CHEST 07/21/2024 01:29:00 PM COMPARISON: Chest x-ray dated 12/09/2021. CLINICAL HISTORY: shob FINDINGS: LUNGS AND PLEURA: No focal pulmonary opacity. No pleural effusion. No pneumothorax. HEART AND MEDIASTINUM: No acute abnormality of the cardiac and mediastinal silhouettes. BONES AND SOFT TISSUES: No acute fracture or destructive lesion. Multilevel thoracic osteophytosis. IMPRESSION: 1. No acute process. Electronically signed by: Greig Pique MD 07/21/2024 03:19 PM EST RP Workstation: [id number]     Procedures   Medications Ordered in the ED  ipratropium-albuterol  (DUONEB) 0.5-2.5 (3) MG/3ML nebulizer solution 3 mL (3 mLs Nebulization Given 07/21/24 1747)                                    Medical Decision Making Amount and/or Complexity of Data Reviewed Labs: ordered. Radiology: ordered.  Risk Prescription drug management.   This patient is a 58 y.o. male who presents to the ED for concern of right leg swelling, this involves an extensive number of treatment options, and is a complaint that carries with it a high risk of complications and morbidity. The emergent differential diagnosis prior to evaluation includes, but is not limited to,  DVT, cellulitis, limb ischemia, edema . This is not an exhaustive differential.   Past Medical History / Co-morbidities / Social History: history of COPD, CAD, CHF, hyperlipidemia, hypertension, MI, PAD, tobacco use  Additional history: Chart reviewed.   Physical Exam: Physical exam performed. The pertinent findings include:  Trace wheezing noted at bilateral lung bases.  RLE with edema noted without erythema, warmth, or tenderness to palpation. No wounds or drainage. Good DP and PT pulses. Full ROM intact.  Lab Tests: I ordered, and personally interpreted labs.  The pertinent results include:  WBC 3.8, no acute laboratory abnormalities   Imaging Studies: I ordered  imaging studies including CXR, DG tib/fib, RLE DVT. I independently visualized and interpreted imaging which showed   CXR: NAD  DG tib/fib: Diffuse  soft tissue edema. No acute osseous abnormality.   US : negative  I agree with the radiologist interpretation.   Cardiac Monitoring:  The patient was maintained on a cardiac monitor.  My attending physician viewed and interpreted the cardiac monitored which showed an underlying rhythm of: sinus rhythm, no STEMI. I agree with this interpretation.   Medications: I ordered medication including duoneb  for wheezing. Reevaluation of the patient after these medicines showed that the patient improved. I have reviewed the patients home medicines and have made adjustments as needed.   Disposition: After consideration of the diagnostic results and the patients response to treatment, I feel that emergency department workup does not suggest an emergent condition requiring admission or immediate intervention beyond what has been performed at this time. The plan is: discharge with close outpatient follow-up and return precautions. Will prescribe Lasix  for RLE edema given CHF history and patient reporting that diuresis helped previously. He shows no sign of diffuse fluid overload here, his CXR is clear and his respiratory symptoms improve with neb treatments. He is not on diuresis at home. No indication for IV treatment of admission. Will give potassium supplement to cover for hypokalemia associated with Lasix . Patient also has no significant signs of infection, however given unilateral swelling, will cover for cellulitis with Keflex . No leukocytosis or signs of sepsis.  Evaluation and diagnostic testing in the emergency department does not suggest an emergent condition requiring admission or immediate intervention beyond what has been performed at this time.  Plan for discharge with close PCP follow-up.  Patient is understanding and amenable with plan, educated on red  flag symptoms that would prompt immediate return.  Patient discharged in stable condition.   I discussed this case with my attending physician Dr. Cottie who cosigned this note including patient's presenting symptoms, physical exam, and planned diagnostics and interventions. Attending physician stated agreement with plan or made changes to plan which were implemented.    Final diagnoses:  Localized swelling of right lower leg    ED Discharge Orders          Ordered    furosemide  (LASIX ) 20 MG tablet  Daily        07/21/24 1851    cephALEXin  (KEFLEX ) 500 MG capsule  3 times daily        07/21/24 1851    potassium chloride  SA (KLOR-CON  M) 20 MEQ tablet  2 times daily        07/21/24 1851          An After Visit Summary was printed and given to the patient.      Cortana Vanderford A, PA-C 07/21/24 1851    Cottie Donnice PARAS, MD 07/21/24 [place]  "

## 2024-07-24 ENCOUNTER — Ambulatory Visit (HOSPITAL_COMMUNITY): Admission: RE | Admit: 2024-07-24

## 2024-07-24 ENCOUNTER — Ambulatory Visit (HOSPITAL_COMMUNITY)
Admission: RE | Admit: 2024-07-24 | Discharge: 2024-07-24 | Disposition: A | Discharge status: Home / Self Care | Source: Ambulatory Visit | Attending: Cardiovascular Disease | Admitting: Cardiovascular Disease

## 2024-07-24 DIAGNOSIS — I739 Peripheral vascular disease, unspecified: Secondary | ICD-10-CM | POA: Insufficient documentation

## 2024-07-24 DIAGNOSIS — I25119 Atherosclerotic heart disease of native coronary artery with unspecified angina pectoris: Secondary | ICD-10-CM | POA: Diagnosis not present

## 2024-07-24 DIAGNOSIS — Z720 Tobacco use: Secondary | ICD-10-CM | POA: Insufficient documentation

## 2024-07-24 LAB — VAS US ABI WITH/WO TBI
Left ABI: 0.79
Right ABI: 0.85

## 2024-07-26 ENCOUNTER — Other Ambulatory Visit: Payer: Self-pay | Admitting: Family Medicine

## 2024-07-26 ENCOUNTER — Other Ambulatory Visit: Payer: Self-pay

## 2024-07-26 MED ORDER — AMLODIPINE BESYLATE 2.5 MG PO TABS: 2.5000 mg | ORAL_TABLET | Freq: Every day | ORAL | 3 refills | Status: AC

## 2024-07-29 ENCOUNTER — Other Ambulatory Visit: Payer: Self-pay | Admitting: Nurse Practitioner

## 2024-07-29 DIAGNOSIS — F32A Depression, unspecified: Secondary | ICD-10-CM

## 2024-07-31 ENCOUNTER — Telehealth: Payer: Self-pay | Admitting: Cardiovascular Disease

## 2024-07-31 NOTE — Telephone Encounter (Signed)
 escitalopram  (LEXAPRO ) 10 MG tablet [[id number]]

## 2024-07-31 NOTE — Telephone Encounter (Signed)
 Patient returned RN's call regarding results.

## 2024-07-31 NOTE — Addendum Note (Signed)
 Addended by: GORDON RONAL SQUIBB on: 07/31/2024 10:22 AM   Modules accepted: Orders

## 2024-07-31 NOTE — Telephone Encounter (Signed)
 Per Dr. Wonda: Progressive lower extremity occlusive disease noted. Mild reduction in ABI's but monophasic waveforms throughout. Please refer to Dr Darron or Dr Ladona for consideration of angiography/intervention if indicated. Thank you  Returned patient call and updated with results of Vascular US  of LE. Pt educated that referral was already in and someone would reach out to get this appointment set up.  Pt verbalizes understanding of plan.

## 2024-08-02 ENCOUNTER — Encounter: Payer: Self-pay | Admitting: Family Medicine

## 2024-08-02 ENCOUNTER — Ambulatory Visit: Admitting: Family Medicine

## 2024-08-02 VITALS — BP 100/53 | HR 77 | Ht 66.0 in | Wt 243.0 lb

## 2024-08-02 DIAGNOSIS — R29898 Other symptoms and signs involving the musculoskeletal system: Secondary | ICD-10-CM | POA: Diagnosis not present

## 2024-08-02 DIAGNOSIS — M5010 Cervical disc disorder with radiculopathy, unspecified cervical region: Secondary | ICD-10-CM

## 2024-08-02 NOTE — Patient Instructions (Signed)
" °  VISIT SUMMARY: Today, we discussed your leg swelling and arm numbness. We reviewed your history of peripheral artery disease and carpal tunnel syndrome, and we developed a plan to address your symptoms.  YOUR PLAN: CERVICAL RADICULOPATHY: You have chronic cervical radiculopathy, which is causing nerve damage in your spine and leading to symptoms in your arm. -Contact an orthopedist for a potential neck injection or surgical consultation if your symptoms persist. -We will refill your muscle relaxants if your neck pain worsens.  PERIPHERAL ARTERY DISEASE OF BILATERAL LOWER EXTREMITIES: You have chronic peripheral artery disease, which is causing leg swelling. Recent studies showed arterial plaques but no blood clots. -Wear compression socks to help manage the leg swelling. -Consult a vein specialist for an ultrasound to assess venous function and potential treatment options.  CARPAL TUNNEL SYNDROME, RIGHT UPPER LIMB: You have carpal tunnel syndrome, which is causing numbness and tingling in your right arm. -Consider wearing a wrist splint at night to alleviate symptoms. -Discuss a surgical release procedure if your symptoms persist.  CORONARY ARTERY DISEASE WITH PRIOR STENT PLACEMENT: You have coronary artery disease with stent placement, which is currently well-managed. -Continue your current medical management for coronary artery disease. -Follow up with your cardiologist in February.   "

## 2024-08-02 NOTE — Progress Notes (Signed)
" ° °  Established Patient Office Visit  Subjective   Patient ID: Jeffrey Winters, male    DOB: 10/01/1965  Age: 58 y.o. MRN: [id number]  Chief Complaint  Patient presents with   Hospitalization Follow-up     History of Present Illness   Jeffrey Winters is a 58 year old male with peripheral artery disease and carpal tunnel syndrome who presents with leg swelling and arm numbness.  He has swelling in his right leg, which prompted a visit to the emergency department. A blood clot was ruled out, and he was prescribed Lasix , a diuretic, which he completed. The swelling initially improved but then worsened. No trouble breathing or swelling elsewhere.  He experiences numbness and tingling in his right arm, particularly affecting the ring and fifth fingers, persisting for about two months. The numbness sometimes extends to the elbow and is accompanied by weakness, causing him to drop things. He describes the sensation as a 'hoarse tingling' that occasionally becomes completely numb. He has had previous neck injections with steroids, which provided relief, and he is currently using muscle relaxers that have alleviated neck pain.  He has a history of peripheral artery disease with stents placed in his heart and legs in 2021. He follows up annually for these stents and recently had an ABI and lower extremity duplex study, which showed no blood clots but indicated plaques in his leg arteries.  He has been diagnosed with carpal tunnel syndrome, affecting his fingers.          The ASCVD Risk score (Arnett DK, et al., 2019) failed to calculate for the following reasons:   Risk score cannot be calculated because patient has a medical history suggesting prior/existing ASCVD   * - Cholesterol units were assumed  Health Maintenance Due  Topic Date Due   Medicare Annual Wellness (AWV)  Never done   COVID-19 Vaccine (1) Never done   DTaP/Tdap/Td (1 - Tdap) Never done   Pneumococcal Vaccine: 50+ Years (1 of  2 - PCV) Never done   Hepatitis B Vaccines 19-59 Average Risk (1 of 3 - 19+ 3-dose series) Never done   Zoster Vaccines- Shingrix (1 of 2) Never done      Objective:     BP (!) 100/53   Pulse 77   Ht 5' 6 (1.676 m)   Wt 243 lb (110.2 kg)   SpO2 97%   BMI 39.22 kg/m    Physical Exam   Gen: alert, oriented Pulm: no respiratory distress Psych: pleasant affect EXTREMITIES: Right leg swelling.        No results found for any visits on 08/02/24.      Assessment & Plan:   Cervical disc disorder with radiculopathy, unspecified cervical region Assessment & Plan: Chronic cervical radiculopathy with NCS showing mod-severe chronic C5-6 radiculopathy. Muscle relaxants effective for neck pain. - Contact orthopedist for potential neck injection or surgical consultation if symptoms persist. Sending in new referral per pt request.   - Refill muscle relaxants if neck pain worsens.  Orders: -     Ambulatory referral to Orthopedic Surgery  Right hand weakness -     Ambulatory referral to Orthopedic Surgery  Obesity, morbid (HCC) Assessment & Plan: Bmi 39 w/ PAD/CAD.  Continue to counsel on weight loss.  Discuss wegovy at next visit.        Return already on file.    Jeffrey MARLA [name], MD  "

## 2024-08-05 NOTE — Assessment & Plan Note (Addendum)
 Chronic cervical radiculopathy with NCS showing mod-severe chronic C5-6 radiculopathy. Muscle relaxants effective for neck pain. - Contact orthopedist for potential neck injection or surgical consultation if symptoms persist. Sending in new referral per pt request.   - Refill muscle relaxants if neck pain worsens.

## 2024-08-05 NOTE — Assessment & Plan Note (Signed)
 Bmi 39 w/ PAD/CAD.  Continue to counsel on weight loss.  Discuss wegovy at next visit.

## 2024-08-07 ENCOUNTER — Ambulatory Visit (INDEPENDENT_AMBULATORY_CARE_PROVIDER_SITE_OTHER): Admitting: Physician Assistant

## 2024-08-07 ENCOUNTER — Encounter: Payer: Self-pay | Admitting: Physician Assistant

## 2024-08-07 DIAGNOSIS — M542 Cervicalgia: Secondary | ICD-10-CM

## 2024-08-07 NOTE — Progress Notes (Signed)
 "  Office Visit Note   Patient: Jeffrey Winters           Date of Birth: 04-28-1966           MRN: [id number] Visit Date: 08/07/2024              Requested by: Chandra Toribio POUR, MD [address] [place],  [place] [address] PCP: Chandra Toribio POUR, MD  Chief Complaint  Patient presents with   Neck - Pain   Left Knee - Pain   Right Knee - Pain      HPI: Patient is a pleasant 59 year old gentleman who follows up on his neck.  He was having significant neck pain with radiation down his right arm.  He has since gotten much better with regards to the pain.  He still is concerned because he has weakness and paresthesias in the ring finger and short finger on the right hand.  Also has been seeing vascular he has a history of stents in his lower legs.  He saw his primary care doctor last week and studies have been updated that do not show any frank occlusive issue.  He wants to know if there is anything else that should be done about this.  He did say this is slowly getting better  Assessment & Plan: Visit Diagnoses:  1. Neck pain     Plan: With regards to his legs I have advised him to call back his primary care and see if they want him to follow-up with his vascular physician.  I do not see any open ulcers no cellulitis he had an ultrasound which was negative.  I told him this was out of my area of expertise but I did encourage him to try wearing some compression hose and elevating his legs.  With regards to his neck is now been over 2 months since his symptoms began he still continues to have numbness in the ring finger and the short finger.  Will order an MRI follow-up once this is completed.  59-year-old  Follow-Up Instructions: Return if symptoms worsen or fail to improve.   Ortho Exam  Patient is alert, oriented, no adenopathy, well-dressed, normal affect, normal respiratory effort. Lamination of the neck he has better range of motion no stiffness no tenderness to palpation.  He has  decreased strength and loss of sensation but brisk capillary refill in the ring finger and the short digit. With regards to his knees right greater than left swelling negative Homans' sign.  No cellulitis no skin breakdown no ulcers    Imaging: No results found. No images are attached to the encounter.  Labs: Lab Results  Component Value Date   HGBA1C 6.0 (H) 03/28/2024   HGBA1C 6.1 (A) 12/26/2021   HGBA1C 6.1 12/26/2021   HGBA1C 6.1 12/26/2021   HGBA1C 6.1 12/26/2021   LABURIC 6.0 03/28/2024     Lab Results  Component Value Date   ALBUMIN 4.2 07/03/2024   ALBUMIN 4.2 03/28/2024   ALBUMIN 4.0 09/20/2023    Lab Results  Component Value Date   MG 1.9 08/15/2019   No results found for: VD25OH  No results found for: PREALBUMIN    Latest Ref Rng & Units 07/21/2024   12:37 PM 09/20/2023    1:37 PM 08/13/2022    9:23 AM  CBC EXTENDED  WBC 4.0 - 10.5 K/uL 3.8  5.0  4.0   RBC 4.22 - 5.81 MIL/uL 5.15  4.90  5.07   Hemoglobin 13.0 -  17.0 g/dL 85.3  86.0  85.6   HCT 39.0 - 52.0 % 44.5  43.0  44.1   Platelets 150 - 400 K/uL 169  201  159      There is no height or weight on file to calculate BMI.  Orders:  Orders Placed This Encounter  Procedures   MR Cervical Spine w/o contrast   No orders of the defined types were placed in this encounter.    Procedures: No procedures performed  Clinical Data: No additional findings.  ROS:  All other systems negative, except as noted in the HPI. Review of Systems  Objective: Vital Signs: There were no vitals taken for this visit.  Specialty Comments:  No specialty comments available.  PMFS History: Patient Active Problem List   Diagnosis Date Noted   Right hand pain 03/15/2024   Dry skin 08/13/2022   Dental abscess 03/11/2022   Radiculopathy, cervical region 01/13/2022   Cervical disc disorder with radiculopathy, unspecified cervical region 01/13/2022   Chronic right shoulder pain 01/08/2022   Right arm pain  12/31/2021   Health care maintenance 12/26/2021   CAD (coronary artery disease) 07/08/2021   Anxiety 03/13/2021   CHF (congestive heart failure) (HCC) 03/13/2021   Depression 03/13/2021   GERD (gastroesophageal reflux disease) 03/13/2021   Obesity, morbid (HCC) 03/13/2021   Tobacco dependence 03/13/2021   Facial cellulitis 11/25/2020   Essential hypertension 11/25/2020   COPD (chronic obstructive pulmonary disease) (HCC) 11/25/2020   Tobacco abuse 11/20/2020   Influenza vaccine refused 05/29/2020   PAD (peripheral artery disease)    Hyperlipidemia LDL goal <55 07/12/2019   Old MI (myocardial infarction) 07/10/2019   Obstructive sleep apnea hypopnea, moderate 05/15/2016   Atherosclerotic heart disease 05/14/2016   Alcohol abuse 02/20/2016   Bradycardia 02/20/2016   Past Medical History:  Diagnosis Date   COPD (chronic obstructive pulmonary disease) (HCC)    Coronary artery disease 2017   MI s/p PCI to OM2 // s/p MI 07/2019 >> DES to RCA; residual RPL2 80 // Myoview  08/2019: EF 61, small area of mod ischemia inf base; Low Risk // Cath 5/23: RCA and OM1 stents ok, mod dz LAD neg RFR, RPLA 80 - med Rx unless refractory angina   Hyperlipidemia LDL goal <70 07/12/2019   Hyperlipidemia   Hypertension    MI (myocardial infarction) (HCC)    PAD (peripheral artery disease)    LE Art US  08/2019: R SFA 50-74; L SFA 50-74 // s/p R SFA PTA in 4/21; s/p L SFA PTA in 3/21 (Dr. Court)   Tobacco use     Family History  Problem Relation Age of Onset   Heart disease Mother    Heart disease Father     Past Surgical History:  Procedure Laterality Date   ABDOMINAL AORTOGRAM W/LOWER EXTREMITY Bilateral 10/05/2019   Procedure: ABDOMINAL AORTOGRAM W/LOWER EXTREMITY;  Surgeon: Court Dorn PARAS, MD;  Location: [facility];  Service: Cardiovascular;  Laterality: Bilateral;   ABDOMINAL AORTOGRAM W/LOWER EXTREMITY N/A 11/09/2019   Procedure: ABDOMINAL AORTOGRAM W/LOWER EXTREMITY;  Surgeon: Court Dorn PARAS, MD;  Location: [facility];  Service: Cardiovascular;  Laterality: N/A;   CORONARY PRESSURE/FFR STUDY N/A 12/10/2021   Procedure: INTRAVASCULAR PRESSURE WIRE/FFR STUDY;  Surgeon: Wonda Sharper, MD;  Location: [facility] INVASIVE CV LAB;  Service: Cardiovascular;  Laterality: N/A;   CORONARY/GRAFT ACUTE MI REVASCULARIZATION N/A 07/10/2019   Procedure: CORONARY/GRAFT ACUTE MI REVASCULARIZATION;  Surgeon: Mady Bruckner, MD;  Location: [facility];  Service:  Cardiovascular;  Laterality: N/A;   LEFT HEART CATH AND CORONARY ANGIOGRAPHY N/A 07/10/2019   Procedure: LEFT HEART CATH AND CORONARY ANGIOGRAPHY;  Surgeon: Mady Bruckner, MD;  Location: [facility];  Service: Cardiovascular;  Laterality: N/A;   LEFT HEART CATH AND CORONARY ANGIOGRAPHY N/A 12/10/2021   Procedure: LEFT HEART CATH AND CORONARY ANGIOGRAPHY;  Surgeon: Wonda Sharper, MD;  Location: [facility] INVASIVE CV LAB;  Service: Cardiovascular;  Laterality: N/A;   PERCUTANEOUS CORONARY STENT INTERVENTION (PCI-S)  2017   DES to OM2 at [facility]   PERIPHERAL INTRAVASCULAR LITHOTRIPSY  11/09/2019   Procedure: INTRAVASCULAR LITHOTRIPSY;  Surgeon: Court Dorn PARAS, MD;  Location: [facility] INVASIVE CV LAB;  Service: Cardiovascular;;   PERIPHERAL VASCULAR ATHERECTOMY Left 10/05/2019   Procedure: PERIPHERAL VASCULAR ATHERECTOMY;  Surgeon: Court Dorn PARAS, MD;  Location: [facility] INVASIVE CV LAB;  Service: Cardiovascular;  Laterality: Left;  SFA   PERIPHERAL VASCULAR BALLOON ANGIOPLASTY  11/09/2019   Procedure: PERIPHERAL VASCULAR BALLOON ANGIOPLASTY;  Surgeon: Court Dorn PARAS, MD;  Location: [facility];  Service: Cardiovascular;;   PERIPHERAL VASCULAR INTERVENTION Left 10/05/2019   Procedure: PERIPHERAL VASCULAR INTERVENTION;  Surgeon: Court Dorn PARAS, MD;  Location: [facility];  Service: Cardiovascular;  Laterality: Left;  SFA   Social History   Occupational History   Not on file  Tobacco Use   Smoking status: Every Day     Current packs/day: 1.00    Average packs/day: 1 pack/day for 7.0 years (7.0 ttl pk-yrs)    Types: Cigarettes   Smokeless tobacco: Never   Tobacco comments:    smokes 1/2 pack per day 06/19/2020  Vaping Use   Vaping status: Never Used  Substance and Sexual Activity   Alcohol use: Not Currently   Drug use: No   Sexual activity: Not Currently       "

## 2024-08-22 ENCOUNTER — Ambulatory Visit
Admission: RE | Admit: 2024-08-22 | Discharge: 2024-08-22 | Disposition: A | Source: Ambulatory Visit | Attending: Physician Assistant

## 2024-08-22 DIAGNOSIS — M542 Cervicalgia: Secondary | ICD-10-CM

## 2024-08-23 ENCOUNTER — Other Ambulatory Visit: Payer: Self-pay | Admitting: Physician Assistant

## 2024-08-23 DIAGNOSIS — M542 Cervicalgia: Secondary | ICD-10-CM

## 2024-08-24 ENCOUNTER — Other Ambulatory Visit: Payer: Self-pay | Admitting: Family Medicine

## 2024-08-24 ENCOUNTER — Other Ambulatory Visit: Payer: Self-pay | Admitting: Nurse Practitioner

## 2024-08-24 DIAGNOSIS — L853 Xerosis cutis: Secondary | ICD-10-CM

## 2024-08-24 NOTE — Telephone Encounter (Signed)
 ammonium lactate  (LAC-HYDRIN ) 12 % lotion [Pharmacy Med Name: AMMONIUM LACTATE  12% LOTION]

## 2024-08-24 NOTE — Telephone Encounter (Signed)
 No longer our pt.  Thank you.    Suzen Shove   CMA II

## 2024-09-04 ENCOUNTER — Ambulatory Visit: Admitting: Cardiovascular Disease

## 2024-09-04 ENCOUNTER — Ambulatory Visit: Admitting: Family Medicine

## 2024-09-13 ENCOUNTER — Ambulatory Visit: Admitting: Family Medicine

## 2024-09-15 ENCOUNTER — Ambulatory Visit: Admitting: Family Medicine

## 2024-10-03 ENCOUNTER — Ambulatory Visit: Admitting: Cardiovascular Disease

## 2024-10-12 ENCOUNTER — Ambulatory Visit: Admitting: Orthopedic Surgery
